# Patient Record
Sex: Male | Born: 1965 | Hispanic: No | Marital: Single | Smoking: Never smoker
Health system: Southern US, Community
[De-identification: ages and names within clinical notes are randomized; demographics above are authoritative.]

## PROBLEM LIST (undated history)

## (undated) DIAGNOSIS — E785 Hyperlipidemia, unspecified: Secondary | ICD-10-CM

## (undated) DIAGNOSIS — E119 Type 2 diabetes mellitus without complications: Secondary | ICD-10-CM

## (undated) DIAGNOSIS — E559 Vitamin D deficiency, unspecified: Secondary | ICD-10-CM

## (undated) HISTORY — DX: Vitamin D deficiency, unspecified: E55.9

## (undated) HISTORY — DX: Hyperlipidemia, unspecified: E78.5

## (undated) HISTORY — DX: Type 2 diabetes mellitus without complications: E11.9

---

## 2000-01-23 HISTORY — PX: LITHOTRIPSY: SUR834

## 2001-06-20 ENCOUNTER — Observation Stay (HOSPITAL_COMMUNITY): Admission: EM | Admit: 2001-06-20 | Discharge: 2001-06-21 | Payer: Self-pay | Admitting: Urology

## 2001-07-17 ENCOUNTER — Ambulatory Visit (HOSPITAL_BASED_OUTPATIENT_CLINIC_OR_DEPARTMENT_OTHER): Admission: RE | Admit: 2001-07-17 | Discharge: 2001-07-17 | Payer: Self-pay | Admitting: Urology

## 2001-07-17 ENCOUNTER — Encounter: Payer: Self-pay | Admitting: Urology

## 2001-07-31 ENCOUNTER — Encounter: Payer: Self-pay | Admitting: Urology

## 2001-07-31 ENCOUNTER — Encounter: Admission: RE | Admit: 2001-07-31 | Discharge: 2001-07-31 | Payer: Self-pay | Admitting: Urology

## 2012-12-29 ENCOUNTER — Ambulatory Visit (INDEPENDENT_AMBULATORY_CARE_PROVIDER_SITE_OTHER): Payer: 59 | Admitting: Internal Medicine

## 2012-12-29 ENCOUNTER — Encounter: Payer: Self-pay | Admitting: Internal Medicine

## 2012-12-29 VITALS — BP 128/72 | HR 64 | Temp 98.1°F | Resp 16 | Ht 64.0 in | Wt 162.4 lb

## 2012-12-29 DIAGNOSIS — Z1212 Encounter for screening for malignant neoplasm of rectum: Secondary | ICD-10-CM

## 2012-12-29 DIAGNOSIS — E559 Vitamin D deficiency, unspecified: Secondary | ICD-10-CM

## 2012-12-29 DIAGNOSIS — Z0000 Encounter for general adult medical examination without abnormal findings: Secondary | ICD-10-CM

## 2012-12-29 DIAGNOSIS — Z113 Encounter for screening for infections with a predominantly sexual mode of transmission: Secondary | ICD-10-CM

## 2012-12-29 DIAGNOSIS — R030 Elevated blood-pressure reading, without diagnosis of hypertension: Secondary | ICD-10-CM

## 2012-12-29 DIAGNOSIS — E782 Mixed hyperlipidemia: Secondary | ICD-10-CM

## 2012-12-29 DIAGNOSIS — E119 Type 2 diabetes mellitus without complications: Secondary | ICD-10-CM

## 2012-12-29 DIAGNOSIS — R7401 Elevation of levels of liver transaminase levels: Secondary | ICD-10-CM

## 2012-12-29 DIAGNOSIS — Z111 Encounter for screening for respiratory tuberculosis: Secondary | ICD-10-CM

## 2012-12-29 DIAGNOSIS — E1169 Type 2 diabetes mellitus with other specified complication: Secondary | ICD-10-CM | POA: Insufficient documentation

## 2012-12-29 LAB — PSA: PSA: 0.46 ng/mL (ref <=4.00)

## 2012-12-29 LAB — CBC WITH DIFFERENTIAL/PLATELET
Basophils Absolute: 0.1 10*3/uL (ref 0.0–0.1)
Basophils Relative: 1 % (ref 0–1)
Eosinophils Absolute: 0.1 10*3/uL (ref 0.0–0.7)
Eosinophils Relative: 1 % (ref 0–5)
HCT: 47.4 % (ref 39.0–52.0)
MCH: 30.2 pg (ref 26.0–34.0)
MCHC: 35.4 g/dL (ref 30.0–36.0)
MCV: 85.3 fL (ref 78.0–100.0)
Monocytes Absolute: 0.5 10*3/uL (ref 0.1–1.0)
Neutro Abs: 5.3 10*3/uL (ref 1.7–7.7)
Platelets: 234 10*3/uL (ref 150–400)
RDW: 14.1 % (ref 11.5–15.5)
WBC: 7.7 10*3/uL (ref 4.0–10.5)

## 2012-12-29 LAB — VITAMIN B12: Vitamin B-12: 581 pg/mL (ref 211–911)

## 2012-12-29 LAB — LIPID PANEL
Cholesterol: 210 mg/dL — ABNORMAL HIGH (ref 0–200)
HDL: 43 mg/dL (ref >39)
Total CHOL/HDL Ratio: 4.9 Ratio
Triglycerides: 220 mg/dL — ABNORMAL HIGH (ref <150)

## 2012-12-29 LAB — HEPATIC FUNCTION PANEL
Albumin: 4.6 g/dL (ref 3.5–5.2)
Total Protein: 7.3 g/dL (ref 6.0–8.3)

## 2012-12-29 LAB — HEPATITIS B SURFACE ANTIBODY,QUALITATIVE: Hep B S Ab: NEGATIVE

## 2012-12-29 LAB — MAGNESIUM: Magnesium: 2.1 mg/dL (ref 1.5–2.5)

## 2012-12-29 LAB — HEPATITIS A ANTIBODY, TOTAL: Hep A Total Ab: REACTIVE — AB

## 2012-12-29 LAB — BASIC METABOLIC PANEL WITH GFR
BUN: 11 mg/dL (ref 6–23)
Creat: 0.86 mg/dL (ref 0.50–1.35)
GFR, Est African American: >89 mL/min
GFR, Est Non African American: >89 mL/min

## 2012-12-29 LAB — HEPATITIS C ANTIBODY: HCV Ab: NEGATIVE

## 2012-12-29 LAB — HEMOGLOBIN A1C: Mean Plasma Glucose: 151 mg/dL — ABNORMAL HIGH (ref <117)

## 2012-12-29 LAB — TESTOSTERONE: Testosterone: 320 ng/dL (ref 300–890)

## 2012-12-29 LAB — HEPATITIS B CORE ANTIBODY, TOTAL: Hep B Core Total Ab: NONREACTIVE

## 2012-12-29 NOTE — Patient Instructions (Signed)
Continue diet & medications same as discussed.   Further disposition pending lab results.    Diabetes and Exercise Exercising regularly is important. It is not just about losing weight. It has many health benefits, such as:  Improving your overall fitness, flexibility, and endurance.  Increasing your bone density.  Helping with weight control.  Decreasing your body fat.  Increasing your muscle strength.  Reducing stress and tension.  Improving your overall health. People with diabetes who exercise gain additional benefits because exercise:  Reduces appetite.  Improves the body's use of blood sugar (glucose).  Helps lower or control blood glucose.  Decreases blood pressure.  Helps control blood lipids (such as cholesterol and triglycerides).  Improves the body's use of the hormone insulin by:  Increasing the body's insulin sensitivity.  Reducing the body's insulin needs.  Decreases the risk for heart disease because exercising:  Lowers cholesterol and triglycerides levels.  Increases the levels of good cholesterol (such as high-density lipoproteins [HDL]) in the body.  Lowers blood glucose levels. YOUR ACTIVITY PLAN  Choose an activity that you enjoy and set realistic goals. Your health care provider or diabetes educator can help you make an activity plan that works for you. You can break activities into 2 or 3 sessions throughout the day. Doing so is as good as one long session. Exercise ideas include:  Taking the dog for a walk.  Taking the stairs instead of the elevator.  Dancing to your favorite song.  Doing your favorite exercise with a friend. RECOMMENDATIONS FOR EXERCISING WITH TYPE 1 OR TYPE 2 DIABETES   Check your blood glucose before exercising. If blood glucose levels are greater than 240 mg/dL, check for urine ketones. Do not exercise if ketones are present.  Avoid injecting insulin into areas of the body that are going to be exercised. For  example, avoid injecting insulin into:  The arms when playing tennis.  The legs when jogging.  Keep a record of:  Food intake before and after you exercise.  Expected peak times of insulin action.  Blood glucose levels before and after you exercise.  The type and amount of exercise you have done.  Review your records with your health care provider. Your health care provider will help you to develop guidelines for adjusting food intake and insulin amounts before and after exercising.  If you take insulin or oral hypoglycemic agents, watch for signs and symptoms of hypoglycemia. They include:  Dizziness.  Shaking.  Sweating.  Chills.  Confusion.  Drink plenty of water while you exercise to prevent dehydration or heat stroke. Body water is lost during exercise and must be replaced.  Talk to your health care provider before starting an exercise program to make sure it is safe for you. Remember, almost any type of activity is better than none. Document Released: 03/31/2003 Document Revised: 09/10/2012 Document Reviewed: 06/17/2012 [facility] Patient Information 2014 [place], [place]. Cholesterol Cholesterol is a white, waxy, fat-like protein needed by your body in small amounts. The liver makes all the cholesterol you need. It is carried from the liver by the blood through the blood vessels. Deposits (plaque) may build up on blood vessel walls. This makes the arteries narrower and stiffer. Plaque increases the risk for heart attack and stroke. You cannot feel your cholesterol level even if it is very high. The only way to know is by a blood test to check your lipid (fats) levels. Once you know your cholesterol levels, you should keep a record of the  test results. Work with your caregiver to to keep your levels in the desired range. WHAT THE RESULTS MEAN:  Total cholesterol is a rough measure of all the cholesterol in your blood.  LDL is the so-called bad cholesterol. This is the  type that deposits cholesterol in the walls of the arteries. You want this level to be low.  HDL is the good cholesterol because it cleans the arteries and carries the LDL away. You want this level to be high.  Triglycerides are fat that the body can either burn for energy or store. High levels are closely linked to heart disease. DESIRED LEVELS:  Total cholesterol below 200.  LDL below 100 for people at risk, below 70 for very high risk.  HDL above 50 is good, above 60 is best.  Triglycerides below 150. HOW TO LOWER YOUR CHOLESTEROL:  Diet.  Choose fish or white meat chicken and [place], roasted or baked. Limit fatty cuts of red meat, fried foods, and processed meats, such as sausage and lunch meat.  Eat lots of fresh fruits and vegetables. Choose whole grains, beans, pasta, potatoes and cereals.  Use only small amounts of olive, corn or canola oils. Avoid butter, mayonnaise, shortening or palm kernel oils. Avoid foods with trans-fats.  Use skim/nonfat milk and low-fat/nonfat yogurt and cheeses. Avoid whole milk, cream, ice cream, egg yolks and cheeses. Healthy desserts include angel food cake, ginger snaps, animal crackers, hard candy, popsicles, and low-fat/nonfat frozen yogurt. Avoid pastries, cakes, pies and cookies.  Exercise.  A regular program helps decrease LDL and raises HDL.  Helps with weight control.  Do things that increase your activity level like gardening, walking, or taking the stairs.  Medication.  May be prescribed by your caregiver to help lowering cholesterol and the risk for heart disease.  You may need medicine even if your levels are normal if you have several risk factors. HOME CARE INSTRUCTIONS   Follow your diet and exercise programs as suggested by your caregiver.  Take medications as directed.  Have blood work done when your caregiver feels it is necessary. MAKE SURE YOU:   Understand these instructions.  Will watch your  condition.  Will get help right away if you are not doing well or get worse. Document Released: 10/03/2000 Document Revised: 04/02/2011 Document Reviewed: 03/26/2007 [facility] Patient Information 2014 [place], [place]. Vitamin D Deficiency Vitamin D is an important vitamin that your body needs. Having too little of it in your body is called a deficiency. A very bad deficiency can make your bones soft and can cause a condition called rickets.  Vitamin D is important to your body for different reasons, such as:   It helps your body absorb 2 minerals called calcium and phosphorus.  It helps make your bones healthy.  It may prevent some diseases, such as diabetes and multiple sclerosis.  It helps your muscles and heart. You can get vitamin D in several ways. It is a natural part of some foods. The vitamin is also added to some dairy products and cereals. Some people take vitamin D supplements. Also, your body makes vitamin D when you are in the sun. It changes the sun's rays into a form of the vitamin that your body can use. CAUSES   Not eating enough foods that contain vitamin D.  Not getting enough sunlight.  Having certain digestive system diseases that make it hard to absorb vitamin D. These diseases include Crohn's disease, chronic pancreatitis, and cystic fibrosis.  Having a  surgery in which part of the stomach or small intestine is removed.  Being obese. Fat cells pull vitamin D out of your blood. That means that obese people may not have enough vitamin D left in their blood and in other body tissues.  Having chronic kidney or liver disease. RISK FACTORS Risk factors are things that make you more likely to develop a vitamin D deficiency. They include:  Being older.  Not being able to get outside very much.  Living in a nursing home.  Having had broken bones.  Having weak or thin bones (osteoporosis).  Having a disease or condition that changes how your body absorbs vitamin  D.  Having dark skin.  Some medicines such as seizure medicines or steroids.  Being overweight or obese. SYMPTOMS Mild cases of vitamin D deficiency may not have any symptoms. If you have a very bad case, symptoms may include:  Bone pain.  Muscle pain.  Falling often.  Broken bones caused by a minor injury, due to osteoporosis. DIAGNOSIS A blood test is the best way to tell if you have a vitamin D deficiency. TREATMENT Vitamin D deficiency can be treated in different ways. Treatment for vitamin D deficiency depends on what is causing it. Options include:  Taking vitamin D supplements.  Taking a calcium supplement. Your caregiver will suggest what dose is best for you. HOME CARE INSTRUCTIONS  Take any supplements that your caregiver prescribes. Follow the directions carefully. Take only the suggested amount.  Have your blood tested 2 months after you start taking supplements.  Eat foods that contain vitamin D. Healthy choices include:  Fortified dairy products, cereals, or juices. Fortified means vitamin D has been added to the food. Check the label on the package to be sure.  Fatty fish like salmon or trout.  Eggs.  Oysters.  Do not use a tanning bed.  Keep your weight at a healthy level. Lose weight if you need to.  Keep all follow-up appointments. Your caregiver will need to perform blood tests to make sure your vitamin D deficiency is going away. SEEK MEDICAL CARE IF:  You have any questions about your treatment.  You continue to have symptoms of vitamin D deficiency.  You have nausea or vomiting.  You are constipated.  You feel confused.  You have severe abdominal or back pain. MAKE SURE YOU:  Understand these instructions.  Will watch your condition.  Will get help right away if you are not doing well or get worse. Document Released: 04/02/2011 Document Revised: 05/05/2012 Document Reviewed: 04/02/2011 [facility] Patient Information 2014  [place], [place].

## 2012-12-29 NOTE — Progress Notes (Signed)
Patient ID: Victor Mcintyre, male   DOB: 03-27-1965, 47 y.o.   MRN: [id number]  Annual Screening Comprehensive Examination  This very nice    presents for complete physical.  Patient has been followed for hx/o elevated BP, Prediabetes, Hyperlipidemia, and Vitamin D Deficiency.   Patient's BP has been controlled at office visits over the last year he has been coming to this office. Patient denies any cardiac symptoms as chest pain, palpitations, shortness of breath, dizziness or ankle swelling.   Patient's hyperlipidemia is not well controlled with diet and he is aware he may need to start medication. Last cholesterol last visit was 235, triglycerides  395,HDL 42 and LDL 114.     Patient has prediabetes with last A1c 6.7% in September and it had been 6.5% in January. Patient denies reactive hypoglycemic symptoms, visual blurring, diabetic polys, or paresthesias.    Finally, patient has history of Vitamin D Deficiency with last vitamin D 28 in November 2013.       Allergies  Allergen Reactions  . Poison Ivy Extract [Extract Of Poison Ivy] Rash    Past Medical History  Diagnosis Date  . Diabetes mellitus without complication   . Hyperlipidemia   . Vitamin D deficiency     Past Surgical History  Procedure Laterality Date  . Lithotripsy  2002    Family History  Problem Relation Age of Onset  . Cancer Mother   . Diabetes Father     History   Social History  . Marital Status: Married x 1 &1/2 years    Spouse Name: N/A    Number of Children: N/A  . Years of Education: N/A   Occupational History  . 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   Social History Main Topics  . Smoking status: Never Smoker   . Smokeless tobacco: Not on file  . Alcohol Use: occas beer  . Drug Use: No  . Sexual Activity: Not on file   Other Topics Concern  . Not on file   Social History  Narrative  . No narrative on file    ROS Constitutional: Denies fever, chills, weight loss/gain, headaches, insomnia, fatigue, night sweats, and change in appetite. Eyes: Denies redness, blurred vision, diplopia, discharge, itchy, watery eyes.  ENT: Denies discharge, congestion, post nasal drip, epistaxis, sore throat, earache, hearing loss, dental pain, Tinnitus, Vertigo, Sinus pain, snoring.  Cardio: Denies chest pain, palpitations, irregular heartbeat, syncope, dyspnea, diaphoresis, orthopnea, PND, claudication, edema Respiratory: denies cough, dyspnea, DOE, pleurisy, hoarseness, laryngitis, wheezing.  Gastrointestinal: Denies dysphagia, heartburn, reflux, water brash, pain, cramps, nausea, vomiting, bloating, diarrhea, constipation, hematemesis, melena, hematochezia, jaundice, hemorrhoids Genitourinary: Denies dysuria, frequency, urgency, nocturia, hesitancy, discharge, hematuria, flank pain Musculoskeletal: Denies arthralgia, myalgia, stiffness, Jt. Swelling, pain, limp, and strain/sprain. Skin: Denies puritis, rash, hives, warts, acne, eczema, changing in skin lesion Neuro: No weakness, tremor, incoordination, spasms, paresthesia, pain Psychiatric: Denies confusion, memory loss, sensory loss Endocrine: Denies change in weight, skin, hair change, nocturia, and paresthesia, diabetic polys, visual blurring, hyper / hypo glycemic episodes.  Heme/Lymph: No excessive bleeding, bruising, or elarged lymph nodes.  Filed Vitals:   12/29/12 1113  BP: 128/72  Pulse: 64  Temp: 98.1 F (36.7 C)  Resp: 16    Estimated body mass index is 27.86 kg/(m^2) as calculated from the following:   Height as of this encounter: 5\' 4"  (1.626 m).   Weight as of  this encounter: 162 lb 6.4 oz (73.664 kg).  Physical Exam General Appearance: Well nourished, in no apparent distress. Eyes: PERRLA, EOMs, conjunctiva no swelling or erythema, normal fundi and vessels. Sinuses: No frontal/maxillary  tenderness ENT/Mouth: EACs patent / TMs  nl. Nares clear without erythema, swelling, mucoid exudates. Oral hygiene is good. No erythema, swelling, or exudate. Tongue normal, non-obstructing. Tonsils not swollen or erythematous. Hearing normal.  Neck: Supple, thyroid normal. No bruits, nodes or JVD. Respiratory: Respiratory effort normal.  BS equal and clear bilateral without rales, rhonci, wheezing or stridor. Cardio: Heart sounds are normal with regular rate and rhythm and no murmurs, rubs or gallops. Peripheral pulses are normal and equal bilaterally without edema. No aortic or femoral bruits. Chest: symmetric with normal excursions and percussion.  Abdomen: Flat, soft, with bowl sounds. Nontender, no guarding, rebound, hernias, masses, or organomegaly.  Lymphatics: Non tender without lymphadenopathy.  Genitourinary: No hernias.Testes nl. DRE - prostate nl for age - smooth & firm w/o nodules. Musculoskeletal: Full ROM all peripheral extremities, joint stability, 5/5 strength, and normal gait. Skin: Warm and dry without rashes, lesions, cyanosis, clubbing or  ecchymosis.  Neuro: Cranial nerves intact, reflexes equal bilaterally. Normal muscle tone, no cerebellar symptoms. Sensation intact.  Pysch: Awake and oriented X 3, normal affect, insight and judgment appropriate.   Assessment and Plan  1. Annual Screening Examination 2. Hx/o elevated BP  3. Hyperlipidemia 4. Pre Diabetes 5. Vitamin D Deficiency  Continue prudent diet as discussed, weight control, BP monitoring, regular exercise, and medications as discussed.  Discussed possible med.s for cholesterol and / or diabetes. Routine screening labs and tests as requested with regular follow-up as recommended.

## 2012-12-30 LAB — MICROALBUMIN / CREATININE URINE RATIO
Creatinine, Urine: 107.9 mg/dL
Microalb Creat Ratio: 5.4 mg/g (ref 0.0–30.0)
Microalb, Ur: 0.58 mg/dL (ref 0.00–1.89)

## 2012-12-30 LAB — URINALYSIS, MICROSCOPIC ONLY
Casts: NONE SEEN
Crystals: NONE SEEN
Squamous Epithelial / LPF: NONE SEEN

## 2012-12-30 LAB — RPR

## 2012-12-30 LAB — INSULIN, FASTING: Insulin fasting, serum: 11 u[IU]/mL (ref 3–28)

## 2012-12-30 LAB — VITAMIN D 25 HYDROXY (VIT D DEFICIENCY, FRACTURES): Vit D, 25-Hydroxy: 29 ng/mL — ABNORMAL LOW (ref 30–89)

## 2012-12-31 LAB — TB SKIN TEST
Induration: 0 mm
TB Skin Test: NEGATIVE

## 2013-01-01 LAB — HEPATITIS B E ANTIBODY: Hepatitis Be Antibody: NEGATIVE

## 2013-04-07 ENCOUNTER — Ambulatory Visit (INDEPENDENT_AMBULATORY_CARE_PROVIDER_SITE_OTHER): Payer: 59 | Admitting: Emergency Medicine

## 2013-04-07 ENCOUNTER — Encounter: Payer: Self-pay | Admitting: Emergency Medicine

## 2013-04-07 VITALS — BP 118/64 | HR 66 | Temp 98.2°F | Resp 16 | Ht 64.0 in | Wt 150.0 lb

## 2013-04-07 DIAGNOSIS — R7309 Other abnormal glucose: Secondary | ICD-10-CM

## 2013-04-07 DIAGNOSIS — E782 Mixed hyperlipidemia: Secondary | ICD-10-CM

## 2013-04-07 DIAGNOSIS — Z79899 Other long term (current) drug therapy: Secondary | ICD-10-CM

## 2013-04-07 DIAGNOSIS — E559 Vitamin D deficiency, unspecified: Secondary | ICD-10-CM

## 2013-04-07 LAB — CBC WITH DIFFERENTIAL/PLATELET
BASOS ABS: 0.1 10*3/uL (ref 0.0–0.1)
BASOS PCT: 1 % (ref 0–1)
EOS ABS: 0.2 10*3/uL (ref 0.0–0.7)
Eosinophils Relative: 2 % (ref 0–5)
HCT: 46.3 % (ref 39.0–52.0)
Hemoglobin: 16.1 g/dL (ref 13.0–17.0)
Lymphocytes Relative: 26 % (ref 12–46)
Lymphs Abs: 2.2 10*3/uL (ref 0.7–4.0)
MCH: 30.3 pg (ref 26.0–34.0)
MCHC: 34.8 g/dL (ref 30.0–36.0)
MCV: 87.2 fL (ref 78.0–100.0)
MONOS PCT: 7 % (ref 3–12)
Monocytes Absolute: 0.6 10*3/uL (ref 0.1–1.0)
NEUTROS PCT: 64 % (ref 43–77)
Neutro Abs: 5.3 10*3/uL (ref 1.7–7.7)
PLATELETS: 238 10*3/uL (ref 150–400)
RBC: 5.31 MIL/uL (ref 4.22–5.81)
RDW: 13.7 % (ref 11.5–15.5)
WBC: 8.3 10*3/uL (ref 4.0–10.5)

## 2013-04-07 NOTE — Patient Instructions (Signed)
Dieta para el control del colesterol y las grasas  (Fat and Cholesterol Control Diet) Su dieta tiene un efecto sobre los niveles de grasa y colesterol en su sangre y rganos. El exceso de grasa y colesterol en la sangre puede afectar su:   Corazn.  Vasos sanguneos (arterias, venas).  Vescula biliar.  Hgado.  Pncreas. CONTROL DE LA GRASA Y EL COLESTEROL CON LA DIETA  Ciertos alimentos pueden subir el colesterol y otros pueden bajarlo. Es importante que reemplace las grasas malas por otros tipos de grasas.  No consuma:   Carnes grasas, como las salchichas y el salame.  Margarina en barra y algunas margarinas en tubo que tienen "aceites parcialmente hidrogenados" en ellos.  Productos horneados, como galletas dulces y saladas que tienen "aceites parcialmente hidrogenados" en ellos.  Los aceites tropicales, como los aceites de coco y de palma.  Consuma los siguientes alimentos:  Cortes de peceto o lomo de carne roja.  Pollo (sin piel).  Pescado.  Ternera.  Carne de pavo picada.  Mariscos.  Frutas, como manzanas.  Verduras, como el brcoli, papas y zanahorias.  Frijoles, arvejas y lentejas (legumbres).  Cereales, como cebada, arroz, cuscs y trigo bulgur.  Pastas (sin salsas de crema). Busque alimentos sin grasas, descremados y bajos en colesterol. Encontrar alimentos con fibra soluble y esteroles vegetales (fitosteroles). Usted debe comer 2 gramos al da de estos alimentos.  IDENTIFIQUE LOS ALIMENTOS BAJOS EN GRASAS Y COLESTEROL   Encuentre los alimentos con fibra soluble y esteroles vegetales (fitosteroles). Debe consumir 2 gramos al da de estos alimentos. Ellos son:  Frutas.  Vegetales.  Granos enteros.  Frijoles y guisantes secos.  Frutos secos y semillas.  Lea las etiquetas del paquete. Busque los alimentos bajos en grasas saturadas, libres en grasas trans, y de bajo contenido graso.  Elija quesos que contengan slo 2 a 3 gramos de grasa saturada  por onza.  Use margarina que sea saludable para el corazn que sea libre de grasas trans o aceites parcialmente hidrogenados.  Evite comprar productos horneados que contengan aceites parcialmente hidrogenados. En su lugar, compre productos horneados elaborados con cereales integrales (trigo integral o harina de avena integral). Evite los productos horneados en cuya etiqueta diga con "harina" o "harina enriquecida".  Compre sopas en lata que no sean cremosas, con bajo contenido de sal y sin grasas adicionadas. PREPARE SUS ALIMENTOS USTED MISMO   Cocine los alimentos hervidos, horneados, al vapor o asados. No fra los alimentos.  Utilice un spray antiadherente para cocinar.  Use limn o hierbas para condimentar comidas en lugar de usar mantequilla o margarina.  Use yogur descremados, salsas o aderezos para ensaladas con bajo contenido de grasas. BAJO EN GRASAS SATURADAS / SUSTITUTOS BAJOS EN GRASA  Carnes / grasas saturadas (g)  Evite: Bife, veteado (3 oz/85 g) / 11 g  Elija: Bife, magro(3 oz/85 g) / 4 g  Evite: Hamburguesa (3 oz/85 g) / 7 g  Elija: Hamburguesa, magra (3 oz/85 g) / 5 g  Evite: Jamn (3 oz/85 g) / 6 g  Elija: Jamn, corte magro (3 oz/85 g) / 2,4 g  Evite: Pollo con piel, carne oscura (3 oz/85 g) / 4 g  Elija: Pollo, sin piel, carne oscura (3 oz/85 g) / 2 g  Evite: Pollo con piel, carne blanca (3 oz/85 g) / 2,5 g  Elija: Pollo, sin piel, carne blanca (3 oz/85 g) / 1 g Lcteos / Grasa saturada (g)  Evite: Leche entera (1 taza) / 5 g  Elija: Leche   descremada, 2% (1 taza) / 3 g  Elija: Leche descremada, 1% (1 taza) / 1,5 g  Elija: Leche descremada, 1 taza / 0,3 g  Evite: Queso duro (1 oz/28 g) / 6 g  Elija: Queso de [employer] (1 oz/28 g) / 2 a 3 g  Evite: Queso cottage, 4% de grasa (1 taza) / 6,5 g  Elija: Queso cottage bajo en grasa, 1% de grasa (1 taza) / 1,5 g  Evite: Helado (1 taza) / 9 g  Elija: Sorbete (1 taza) / 2,5 g  Elija: Yogur  congelado descremado (1 taza) / 0,3 g  Elija: Barra de frutas congeladas / trace  Evite: Crema batida (1 cucharada) / 3,5 g  Elija: Crema batida no lctea (1 cucharada) / 1 g Condimentos / Grasas Saturadas (g)  Evite: Mayonesa (1 cucharada) / 2 g  Elija: Mayonesa baja en grasa (1 cucharada) / 1 g  Evite: Mantequilla (1 cucharada) / 7 g  Elija: Margarina light extra (1 cucharada) / 1 g  Evite: Aceite de coco (1 cucharada) / 11,8 g  Elija: Aceite de oliva (1 cucharada) / 1,8 g  Elija: Aceite de maz (1 cucharada) / 1,7 g  Elija: Aceite de crtamo (1 cucharada) / 1,2 g  Elija: Aceite de girasol (1 cucharada) / 1,4 g  Elija: Aceite de soja (1 cucharada) / 2,4 g  Elija: Aceite de canola (1 cucharada) / 1 g Document Released: 07/10/2011 Document Revised: 05/05/2012 ExitCare Patient Information 2014 [place], [place]. Gua de planeamiento de la alimentacin para diabticos (Diabetes Meal Planning Guide) La gua de planeamiento de alimentacin para diabticos es una herramienta para ayudarlo a planear sus comidas y colaciones. Es importante para las personas con diabetes controlar sus niveles de International aid/development worker. Elegir los [employer] correctos y las cantidades adecuadas durante el da le ayudar a Technical brewer. [place] bien puede incluso ayudarlo a mejorar la presin sangunea y Barista o Pharmacologist un peso saludable. CUENTE LOS HIDRATOS DE CARBONO CON FACILIDAD Cuando consume hidratos de carbono, stos se transforman en azcar (glucosa). Esto a su vez Counsellor de Production assistant, radio. El conteo de carbohidratos puede ayudarlo a Chief Operating Officer este nivel para que se sienta mejor. Al planear sus alimentos con el conteo de carbohidratos, podr tener ms flexibilidad en lo que come y Physiological scientist con el consumo de alimentos. El conteo de carbohidratos significa simplemente sumar la cantidad total de gramos de carbohidratos a sus comidas o colaciones. Trate de consumir la misma  cantidad en cada comida. A continuacin encontrar una lista de 1 porcin o 15 gr. de carbohidratos. A continuacin se enumeran. Pregunte al mdico cuntos gramos de carbohidratos necesita comer en cada comida o colacin. Almidones y granos  1 [place] de pan.   bollo ingls o bollo para hamburguesa o hotdog.   taza de cereal fro (sin azcar).   taza de pasta o arroz cocido.   taza de vegetales que contengan almidn (maz, papas, arvejas, porotos, calabaza).  1 omelette (6 pulgadas).   bollo.  1 waffle o panqueque (del tamao de un CD).   taza de cereales cocidos.  4 a 6 galletas saldas pequeas. *Se recomienda el consumo de granos enteros. Frutas  1 taza de frutos rojos, meln, papaya o anan sin azcar.  1 fruta fresca pequea.   banana o mango.   taza de jugo de frutas (4 onzas sin endulzar).   taza de fruta envasada en jugo natural o agua.  2 cucharadas de frutas  secas.  12-15 uvas o cerezas. Leche y yogurt  1 taza de PPG Industriesleche descremada o al 1%.  1 taza de leche de soja.  6 onzas de yogurt descremado con edulcorante sin azcar.  6 onzas de yogur descremado de soja.  6 onzas de yogur natural. Vegetales  1 taza de vegetales crudos o  de vegetales cocidos se considera cero carbohidratos o una comida "libre".  Si come 3 o ms porciones en una comida, cuntelas como 1 porcin de carbohidratos. Otros carbohidratos   onzas de chips o pretzels.   taza de helado de crema o yogur helado.   taza de helado de agua.  5 cm de torta no congelada.  1 cucharada de miel, azcar, mermelada, jalea o almbar.  2 galletitas dulces pequeas.  3 cuadrados de crackers de graham.  3 tazas de palomitas de maz.  6 crackers.  1 taza de caldo.  Cuente 1 taza de guisado u otra mezcla de alimentos como 2 porciones de carbohidratos.  Los alimentos con menos de 20 caloras por porcin deben contarse como cero carbohidratos o alimento "libre". Si lo desea  compre un libro o software de computacin que enumere la cantidad de gramos de carbohidratos de los diferentes alimentos. Adems, el panel nutricional en las etiquetas de los productos que consume es una buena fuente de informacin. Le indicar el tamao de la porcin y la cantidad total de carbohidratos que consumir por cada una. Divida este nmero por 15 para obtener el nmero de conteo de carbohidratos por porcin. Recuerde: cada porcin son 5815 gramos de carbohidratos. PORCIONES La medicin de los alimentos y el tamao de las porciones lo ayudarn a Scientist, physiologicalcontrolar la cantidad exacta de comida que debe ingerir. La lista que sigue le mostrar el tamao de algunas porciones comunes.   1 onza.................4 dados apilados.  3 onzas..............Marland Kitchen.Un mazo de cartas.  1 cucharadita...Marland Kitchen.Marland Kitchen.La punta de un dedo pequeo.  1 cucharada.......Marland Kitchen.Un dedo.  2 cucharadas....Marland Kitchen.Marland Kitchen.Una pelota de golf.   taza..............Marland Kitchen.La mitad de un puo.  1 taza...............Marland Kitchen.Un puo. EJEMPLO DE PLAN DE ALIMENTACIN PARA DIABTICOS: A continuacin se muestra un ejemplo de plan de alimentacin que incluye comidas de los grupos de granos y Stonebridgefculas, Sports administratorvegetales, frutas y carnes. Un nutricionista podr confeccionarle un plan individualizado para cubrir sus necesidades calricas y decirle el nmero de porciones que necesita de [place] Millscada grupo. Sin embargo, podra Pulte Homesintercambiar los alimentos que contengan carbohidratos (lcteos, cereales y frutas). Controlar la cantidad total de carbohidratos en los alimentos o colaciones es ms importante que asegurarse de incluir todos los grupos alimenticios cada vez que come.  El siguiente plan de alimentacin es un ejemplo de una dieta de 2000 caloras mediante el conteo de carbohidratos. Este plan contiene 17 porciones de carbohidratos. Desayuno  1 taza de avena (2 porciones de carbohidratos).   taza de yogur light(1 porcin de carbohidratos).  1 taza de arndanos (1 porcin de carbohidratos).    taza de almendras. Colacin  1 manzana grande (2 porciones de carbohidratos).  1 palito de queso bajo [employer]en grasa. Almuerzo  Ensalada de pechuga de pollo.  1 taza de espinacas.   taza de tomates cortados.  2 oz (60 gr) de pechuga de pollo en rebanadas.  2 cucharadas de aderezo italiano bajo en Avnetcontenido graso.  12 galletas integrales (2 porciones de carbohidratos).  12 a 15 uvas (1 porcin de carbohidratos).  1 taza de PPG Industriesleche descremada (1porcin de carbohidratos). Colacin  1 taza de zanahorias.   taza de pur de garbanzos (1 porcin de  carbohidratos). Cena  3 oz (80 gr) de salmn a la parrilla.  1 taza de arroz integral (3 porciones de carbohidratos). Colacin  1  taza de brcoli al vapor (1 porcin de carbohidrato) con una cucharadita de aceite de oliva y jugo de limn.  1 taza de budn light (2 porciones de carbohidratos). HOJA DE PLANEAMIENTO DE LA ALIMENTACIN: El dietista podr utilizar esta hoja para ayudarlo a decidir cuntas porciones y qu tipos de alimentos son los adecuados para usted.  DESAYUNO Grupo de alimentos y porciones / Alimento elegido Granos/Fculas_________________________________________________ Lcteos________________________________________________________ Rufina Falco ______________________________________________________ Lou Miner __________________________________________________________ Charlesetta Ivory _________________________________________________________ Rosalin Hawking _________________________________________________________ Lorin Mercy de alimentos y porciones / Alimento elegido Granos/Fculas___________________________________________________ Lcteos_________________________________________________________ Lou Miner ___________________________________________________________ Charlesetta Ivory __________________________________________________________ Rosalin Hawking __________________________________________________________ Danford Bad de alimentos y porciones /  Alimento elegido Granos/Fculas___________________________________________________ Lcteos_________________________________________________________ Lou Miner ___________________________________________________________ Charlesetta Ivory __________________________________________________________ Rosalin Hawking __________________________________________________________ Jettie Pagan de alimentos y porciones / Alimento elegido Granos/Fculas_________________________________________________ Lcteos________________________________________________________ Rufina Falco ______________________________________________________ Lou Miner _________________________________________________________ Charlesetta Ivory ________________________________________________________ Rosalin Hawking ________________________________________________________ Carolyn Stare DIARIOS Fculas_______________________________________________________ Vegetales _____________________________________________________ Lou Miner ________________________________________________________ Lcteos_______________________________________________________ Carnes________________________________________________________ Rosalin Hawking ________________________________________________________ Document Released: 04/17/2007 Document Revised: 04/02/2011 ExitCare Patient Information 2014 [place].

## 2013-04-07 NOTE — Progress Notes (Signed)
Subjective:    Patient ID: Victor Mcintyre, male    DOB: 01/29/1965, 48 y.o.   MRN: [id number]  HPI Comments: 48 yo male with new diagnosis DM at last OV. He has never been on cholesterol or Diabetic medicine. He was newly diagnosed Vitamin D def and added vit d at last office visit. He is trying to improve diet to stay off of RX.  LAST LAB CHOL         210   12/29/2012 HDL           43   12/29/2012 LDLCALC      123   12/29/2012 TRIG         220   12/29/2012 CHOLHDL      4.9   12/29/2012 ALT           29   12/29/2012 AST           19   12/29/2012 ALKPHOS       81   12/29/2012 BILITOT      0.5   12/29/2012 CREATININE     0.86   12/29/2012 BUN              11   12/29/2012 NA              137   12/29/2012 K               4.3   12/29/2012 CL              101   12/29/2012 CO2              28   12/29/2012 HGBA1C      6.9   12/29/2012  He has been working around Engineer, agriculturalchemical fumes but denies any exposure directly to eyes. He notes they have been red and irritated but have improved since initial exposure. He denies pain and visual change. He notes some allergy drainage.      Medication List       This list is accurate as of: 04/07/13  3:09 PM.  Always use your most recent med list.               Vitamin D-3 5000 UNITS Tabs  Take 5,000 Units by mouth daily.        Review of Systems  HENT: Positive for postnasal drip.   Eyes: Positive for redness.  All other systems reviewed and are negative.   BP 118/64  Pulse 66  Temp(Src) 98.2 F (36.8 C) (Temporal)  Resp 16  Ht 5\' 4"  (1.626 m)  Wt 150 lb (68.04 kg)  BMI 25.73 kg/m2     Objective:   Physical Exam  Nursing note and vitals reviewed. Constitutional: He is oriented to person, place, and time. He appears well-developed and well-nourished.  HENT:  Head: Normocephalic and atraumatic.  Right Ear: External ear normal.  Left Ear: External ear normal.  Nose: Nose normal.  Mouth/Throat: No oropharyngeal exudate.  Eyes: EOM are normal.  Pupils are equal, round, and reactive to light. Right eye exhibits no discharge. Left eye exhibits no discharge.  Injected bilaterally, no obvious FB or trauma  Neck: Normal range of motion. Neck supple. No JVD present. No thyromegaly present.  Cardiovascular: Normal rate, regular rhythm, normal heart sounds and intact distal pulses.   Pulmonary/Chest: Effort normal and breath sounds normal.  Abdominal: Soft. Bowel sounds are normal. He exhibits no distension and no mass. There is no tenderness. There is  no rebound and no guarding.  Musculoskeletal: Normal range of motion. He exhibits no edema and no tenderness.  Lymphadenopathy:    He has no cervical adenopathy.  Neurological: He is alert and oriented to person, place, and time. He has normal reflexes. No cranial nerve deficit. Coordination normal.  Skin: Skin is warm and dry.  Psychiatric: He has a normal mood and affect. His behavior is normal. Judgment and thought content normal.          Assessment & Plan:  1.  3 month F/U for Cholesterol, New DX DM, D. Deficient. Needs healthy diet, cardio QD and obtain healthy weight. Check Labs, Check BP if >130/80 call office, Check BS if >200 call office  2. Allergic rhinitis- Allegra OTC, increase H2o, allergy hygiene explained. Try to limit exposure to chemical fumes with eyes/ wear protective gear, try to use eye drops for wetting/ irritation OTC if no relief needs EYE Doctor evaluation

## 2013-04-08 ENCOUNTER — Other Ambulatory Visit: Payer: Self-pay | Admitting: Emergency Medicine

## 2013-04-08 ENCOUNTER — Other Ambulatory Visit: Payer: Self-pay | Admitting: *Deleted

## 2013-04-08 LAB — BASIC METABOLIC PANEL WITH GFR
BUN: 13 mg/dL (ref 6–23)
CO2: 28 mEq/L (ref 19–32)
Calcium: 9.8 mg/dL (ref 8.4–10.5)
Chloride: 102 mEq/L (ref 96–112)
Creat: 0.81 mg/dL (ref 0.50–1.35)
GFR, Est Non African American: >89 mL/min
Glucose, Bld: 84 mg/dL (ref 70–99)
Potassium: 4.4 mEq/L (ref 3.5–5.3)
SODIUM: 137 meq/L (ref 135–145)

## 2013-04-08 LAB — HEPATIC FUNCTION PANEL
ALT: 32 U/L (ref 0–53)
AST: 24 U/L (ref 0–37)
Albumin: 4.6 g/dL (ref 3.5–5.2)
Alkaline Phosphatase: 82 U/L (ref 39–117)
BILIRUBIN DIRECT: 0.1 mg/dL (ref 0.0–0.3)
BILIRUBIN INDIRECT: 0.3 mg/dL (ref 0.2–1.2)
Total Bilirubin: 0.4 mg/dL (ref 0.2–1.2)
Total Protein: 7.1 g/dL (ref 6.0–8.3)

## 2013-04-08 LAB — LIPID PANEL
CHOL/HDL RATIO: 5.9 ratio
Cholesterol: 224 mg/dL — ABNORMAL HIGH (ref 0–200)
HDL: 38 mg/dL — ABNORMAL LOW (ref >39)
Triglycerides: 475 mg/dL — ABNORMAL HIGH (ref <150)

## 2013-04-08 LAB — VITAMIN D 25 HYDROXY (VIT D DEFICIENCY, FRACTURES): Vit D, 25-Hydroxy: 60 ng/mL (ref 30–89)

## 2013-04-08 LAB — MAGNESIUM: Magnesium: 2.1 mg/dL (ref 1.5–2.5)

## 2013-04-08 LAB — INSULIN, FASTING: Insulin fasting, serum: 5 u[IU]/mL (ref 3–28)

## 2013-04-08 MED ORDER — LOVASTATIN 20 MG PO TABS: 20.0000 mg | ORAL_TABLET | Freq: Every day | ORAL | Status: DC

## 2013-05-19 ENCOUNTER — Encounter: Payer: Self-pay | Admitting: Emergency Medicine

## 2013-05-19 ENCOUNTER — Ambulatory Visit (INDEPENDENT_AMBULATORY_CARE_PROVIDER_SITE_OTHER): Payer: 59 | Admitting: Emergency Medicine

## 2013-05-19 VITALS — BP 116/72 | HR 80 | Temp 97.8°F | Resp 18 | Ht 64.0 in | Wt 154.0 lb

## 2013-05-19 DIAGNOSIS — L309 Dermatitis, unspecified: Secondary | ICD-10-CM

## 2013-05-19 DIAGNOSIS — E782 Mixed hyperlipidemia: Secondary | ICD-10-CM

## 2013-05-19 DIAGNOSIS — L259 Unspecified contact dermatitis, unspecified cause: Secondary | ICD-10-CM

## 2013-05-19 DIAGNOSIS — M25579 Pain in unspecified ankle and joints of unspecified foot: Secondary | ICD-10-CM

## 2013-05-19 MED ORDER — FLUCONAZOLE 150 MG PO TABS: 150.0000 mg | ORAL_TABLET | ORAL | Status: DC

## 2013-05-19 MED ORDER — TRIAMCINOLONE ACETONIDE 0.5 % EX OINT: 1.0000 "application " | TOPICAL_OINTMENT | Freq: Two times a day (BID) | CUTANEOUS | Status: DC

## 2013-05-19 NOTE — Progress Notes (Signed)
   Subjective:    Patient ID: Victor Mcintyre, male    DOB: 05/21/1965, 48 y.o.   MRN: [id number]  HPI Comments: 48 yo male for Cholesterol recheck with labs with RX change. He is trying to eat healthier and exercise AD.  He twisted Left ankle 2 months ago and [facility] He notes ankle still painful especially with certain movements and wants referral to Ortho for evaluation. He denies pain with walking/ weight bearing in normal ROM.   Poison IVY x week on both arms. He has washed his linens but feels it is spreading. He has tried OTC lotions w/o relief.  Left foot raised rash has returned. He notes resolved in past with Diflucan and topical steroid. He denies any animal exposure for ring worm.     Medication List       This list is accurate as of: 05/19/13  4:30 PM.  Always use your most recent med list.               lovastatin 20 MG tablet  Commonly known as:  MEVACOR  Take 1 tablet (20 mg total) by mouth at bedtime.     Vitamin D-3 5000 UNITS Tabs  Take 5,000 Units by mouth daily.          Review of Systems  Musculoskeletal: Positive for arthralgias.  Skin: Positive for color change and rash.  All other systems reviewed and are negative.  BP 116/72  Pulse 80  Temp(Src) 97.8 F (36.6 C) (Temporal)  Resp 18  Ht 5\' 4"  (1.626 m)  Wt 154 lb (69.854 kg)  BMI 26.42 kg/m2     Objective:   Physical Exam  Nursing note and vitals reviewed. Constitutional: He is oriented to person, place, and time. He appears well-developed and well-nourished.  HENT:  Head: Normocephalic and atraumatic.  Right Ear: External ear normal.  Left Ear: External ear normal.  Nose: Nose normal.  Eyes: Conjunctivae and EOM are normal.  Neck: Normal range of motion. Neck supple. No JVD present. No thyromegaly present.  Cardiovascular: Normal rate, regular rhythm, normal heart sounds and intact distal pulses.   Pulmonary/Chest: Effort normal and breath sounds normal.   Abdominal: Soft. Bowel sounds are normal. He exhibits no distension. There is no tenderness.  Musculoskeletal: Normal range of motion. He exhibits tenderness. He exhibits no edema.  Mild with lateral ROM of left ankle  Lymphadenopathy:    He has no cervical adenopathy.  Neurological: He is alert and oriented to person, place, and time. He has normal reflexes. No cranial nerve deficit. Coordination normal.  Skin: Skin is warm and dry. Rash noted. There is erythema.  Bilateral arms with sporadic erythematous, elevated pustules 2-4 mm. Left foot with nickel size erythematous raised patch anterior.  Psychiatric: He has a normal mood and affect. His behavior is normal. Judgment and thought content normal.          Assessment & Plan:  1. Cholesterol- recheck labs, Need to eat healthier and exercise AD.   2. Rhus Dermatitis- Declines Prednisone oral/ shot. TOpical steroid AD. Hygiene advised  3. ? Ring worm- Topical steroid and Diflucan AD  4. ? Ankle strain- Ref ORtho, [facility] call with any concerns

## 2013-05-19 NOTE — Patient Instructions (Signed)
Hiedra venenosa (Poison Fisher Scientificvy) La hiedra venenosa es una erupcin causada por tocar las hojas de la planta hiedra venenosa. Generalmente la erupcin aparece 48 horas despus. Puede ser que slo tenga bultos, enrojecimiento y picazn. En algunos casos aparecen ampollas que se rompen Podr DIRECTVtener los ojos hinchados (irritados). La hiedra venenosa generalmente se cura en 2 a 3 semanas sin tratamiento. CUIDADOS EN EL HOGAR  Si ha tocado una hiedra venenosa:  Lave la piel con agua y jabn inmediatamente. Lave debajo de las uas. No se frote la piel.  Lave todas las prendas que haya usado.  Evite la hiedra venenosa en el futuro. La hiedra venenosa tiene 3 hojas en un tallo.  Use medicamentos para Consulting civil engineeraliviar la picazn que le haya indicado el mdico. No conduzca automviles mientras toma este medicamento.  Mantenga las llagas abiertas secas y limpias y cubiertas con un vendaje y con crema medicinal, si es necesario.  Consulte con el mdico los medicamentos que podr administrarle a los nios. SOLICITE AYUDA DE INMEDIATO SI:  Tiene llagas abiertas.  El enrojecimiento se extiende ms all de la zona de la erupcin.  Un lquido blanco amarillento (pus) aparece en el lugar de la erupcin.  El dolor [place]peora.  La temperatura oral le sube a ms de 38,9 C (102 F), y no puede bajarla con medicamentos. ASEGRESE DE QUE:  Comprende estas instrucciones.  Controlar su enfermedad.  Solicitar ayuda de inmediato si no mejora o empeora. Document Released: 04/25/2010 Document Revised: 04/02/2011 [facility] Patient Information 2014 [place]ExitCare, [place]LLC. Tia corporal  (Body Ringworm) La tia corporal (tinea corporis) es una infeccin por hongos en la piel del cuerpo. La causa de esta infeccin no son gusanos, sino un hongo. Los hongos normalmente viven en la superficie de la piel y pueden ser tiles. Sin embargo, en el caso de la [place] Point[place]ia, los hongos crecen de [place] Bon[place] Heightsmanera descontrolada y causan una infeccin en la piel.  Puede afectar a cualquier zona de la piel del cuerpo y puede propagarse fcilmente de Neomia Dearuna persona a otra (es contagiosa). La tia es un problema frecuente en los nios, pero tambin puede afectar a los adultos. Tambin generalmente la sufren los atletas, en especial en los luchadores que comparten equipos y colchonetas.  CAUSAS  La causa de la tia corporal es un hongo llamado dermatofito. Se puede propagar a travs de:   Contacto con Nucor Corporationotras personas infectadas.  Contacto con mascotas infectadas.  Tocar o compartir objetos que BorgWarnerhayan estado en contacto con una persona o con una mascota infectada (sombreros, peines, toallas, ropa, artculos deportivos). SNTOMAS   Picazn, manchas rojas elevadas o bultos en la piel.  Erupcin en forma de anillos.  Enrojecimiento cerca del borde de la erupcin con un centro claro.  Piel seca y escamosa dentro o alrededor de la erupcin. No todas las personas tienen una erupcin en forma de [place]. Algunos desarrollan slo manchas rojas y escamosas.  DIAGNSTICO  Generalmente, la tia puede diagnosticarse mediante la realizacin de un examen de la piel. El mdico puede optar por realizar un raspado de la piel de la zona afectada. La muestra se examinar con un microscopio para determinar si hay hongos. TRATAMIENTO  La tia corporal puede tratarse con una crema o ungento antifngico tpico. En algunos casos, se indica un champ antihongos para el cuerpo. Podrn recetarle medicamentos antimicticos para tomar por boca si la tia es grave, si reaparece o si se prolonga por mucho tiempo.  INSTRUCCIONES PARA EL CUIDADO EN EL HOGAR   Tome slo  medicamentos de venta libre o recetados, segn las indicaciones del mdico.  Verdie DrownLave el rea afectada y seque bien antes de aplicar la crema o la pomada.  Cuando use el champ antimictico para tratar la tia, deje el Levi Strausschamp sobre el cuerpo durante 3 a 5 minutos antes de enjuagar.   Use ropa suelta para evitar roces e  irritacin en la erupcin.  Lave o cambie sus sbanas cada noche mientras tiene la erupcin.  Si su mascota tiene la misma infeccin, hgalo tratar por un veterinario. Para prevenir la tia corporal:   Mantenga una buena higiene.  Use sandalias o zapatos en lugares pblicos y duchas.  No comparta artculos personales con Nucor Corporationotras personas.  Evite tocar las [address] West O[address] Streetmanchas rojas de piel de Economistotras personas.  Evite tocar las Auto-Owners Insurancemascotas que tienen zonas sin pelos o lvese las manos despus de tocarlo. SOLICITE ATENCIN MDICA SI:   La erupcin contina diseminndose despus de 7 das de Lebanontratamiento.  La erupcin no se cura en el trmino de 4 semanas.  El rea alrededor de la erupcin se vuelve roja, se hincha o duele. Document Released: 10/18/2004 Document Revised: 10/03/2011 [facility] LodgeExitCare Patient Information 2014 [place]ExitCare, MarylandLLC. Preventing Toenail Fungus from Recurring   Sanitize your shoes with Mycomist spray or a similar shoe sanitizer spray.  Follow the instructions on the bottle and dry them outside in the sun or with a hairdryer.  We also recommend repeating the sanitization once weekly in shoes you wear most often.   Throw away any shoes you have worn a significant amount without socks-fungus thrives in a warm moist environment and you want to avoid re-infection after your laser procedure   Bleach your socks with regular or color safe bleach   Change your socks regularly to keep your feet clean and dry (especially if you have sweaty feet)-if sweaty feet are a problem, let your doctor know-there is a great lotion that helps with this problem.   Clean your toenail clippers with alcohol before you use them if you do your own toenails and make sure to replace Emory boards and orange sticks regularly   If you get regular pedicures, bring your own instruments or go to a spa that sterilizes their instruments in an autoclave.

## 2013-05-20 LAB — LIPID PANEL
Cholesterol: 175 mg/dL (ref 0–200)
HDL: 38 mg/dL — ABNORMAL LOW (ref >39)
LDL CALC: 85 mg/dL (ref 0–99)
TRIGLYCERIDES: 260 mg/dL — AB (ref <150)
Total CHOL/HDL Ratio: 4.6 Ratio
VLDL: 52 mg/dL — AB (ref 0–40)

## 2013-05-20 LAB — HEPATIC FUNCTION PANEL
ALBUMIN: 4.2 g/dL (ref 3.5–5.2)
ALT: 23 U/L (ref 0–53)
AST: 18 U/L (ref 0–37)
Alkaline Phosphatase: 70 U/L (ref 39–117)
BILIRUBIN INDIRECT: 0.4 mg/dL (ref 0.2–1.2)
BILIRUBIN TOTAL: 0.5 mg/dL (ref 0.2–1.2)
Bilirubin, Direct: 0.1 mg/dL (ref 0.0–0.3)
TOTAL PROTEIN: 6.9 g/dL (ref 6.0–8.3)

## 2013-07-02 ENCOUNTER — Ambulatory Visit (INDEPENDENT_AMBULATORY_CARE_PROVIDER_SITE_OTHER): Payer: 59 | Admitting: Internal Medicine

## 2013-07-02 ENCOUNTER — Encounter: Payer: Self-pay | Admitting: Internal Medicine

## 2013-07-02 VITALS — BP 100/64 | HR 72 | Temp 98.1°F | Resp 16 | Ht 64.0 in | Wt 154.6 lb

## 2013-07-02 DIAGNOSIS — R030 Elevated blood-pressure reading, without diagnosis of hypertension: Secondary | ICD-10-CM

## 2013-07-02 DIAGNOSIS — Z79899 Other long term (current) drug therapy: Secondary | ICD-10-CM

## 2013-07-02 DIAGNOSIS — E782 Mixed hyperlipidemia: Secondary | ICD-10-CM

## 2013-07-02 DIAGNOSIS — E559 Vitamin D deficiency, unspecified: Secondary | ICD-10-CM

## 2013-07-02 DIAGNOSIS — E119 Type 2 diabetes mellitus without complications: Secondary | ICD-10-CM

## 2013-07-02 DIAGNOSIS — J0410 Acute tracheitis without obstruction: Secondary | ICD-10-CM

## 2013-07-02 LAB — CBC WITH DIFFERENTIAL/PLATELET
Basophils Absolute: 0 10*3/uL (ref 0.0–0.1)
Basophils Relative: 0 % (ref 0–1)
Eosinophils Absolute: 0.1 10*3/uL (ref 0.0–0.7)
Eosinophils Relative: 1 % (ref 0–5)
HEMATOCRIT: 45.2 % (ref 39.0–52.0)
HEMOGLOBIN: 15.5 g/dL (ref 13.0–17.0)
LYMPHS ABS: 1.7 10*3/uL (ref 0.7–4.0)
LYMPHS PCT: 26 % (ref 12–46)
MCH: 30.1 pg (ref 26.0–34.0)
MCHC: 34.3 g/dL (ref 30.0–36.0)
MCV: 87.8 fL (ref 78.0–100.0)
MONO ABS: 0.5 10*3/uL (ref 0.1–1.0)
MONOS PCT: 8 % (ref 3–12)
Neutro Abs: 4.2 10*3/uL (ref 1.7–7.7)
Neutrophils Relative %: 65 % (ref 43–77)
Platelets: 222 10*3/uL (ref 150–400)
RBC: 5.15 MIL/uL (ref 4.22–5.81)
RDW: 14.1 % (ref 11.5–15.5)
WBC: 6.5 10*3/uL (ref 4.0–10.5)

## 2013-07-02 MED ORDER — AZITHROMYCIN 250 MG PO TABS
ORAL_TABLET | ORAL | Status: DC
Start: 2013-07-02 — End: 2013-10-16

## 2013-07-02 MED ORDER — HYDROCODONE-ACETAMINOPHEN 5-325 MG PO TABS: ORAL_TABLET | ORAL | Status: DC

## 2013-07-02 MED ORDER — PREDNISONE 20 MG PO TABS
ORAL_TABLET | ORAL | Status: AC
Start: 2013-07-02 — End: 2013-08-01

## 2013-07-02 NOTE — Patient Instructions (Signed)
Bronquitis  (Bronchitis)  La bronquitis es una inflamación de las vías respiratorias que se extienden desde la tráquea hasta los pulmones (bronquios). A menudo, la inflamación produce la formación de mucosidad, lo que genera tos. Si la inflamación es grave, puede provocar falta de aire.  CAUSAS   Las causas de la bronquitis pueden ser:   · Infecciones virales.  · Bacterias.  · Humo del cigarrillo.  · Alérgenos, contaminantes y otros irritantes.  SIGNOS Y SÍNTOMAS   El síntoma más habitual de la bronquitis es la tos frecuente con mucosidad. Otros síntomas son:  · Fiebre.  · Dolores en el cuerpo.  · Congestión en el pecho.  · Escalofríos.  · Falta de aire.  · Dolor de garganta.  DIAGNÓSTICO   La bronquitis en general se diagnostica con la historia clínica y un examen físico. En algunos casos se indican otros estudios, como radiografías, para descartar otras enfermedades.   TRATAMIENTO   Tal vez deba evitar el contacto con la causa del problema (por ejemplo, el cigarrillo). En algunos casos es necesario administrar medicamentos. Estos pueden ser:  · Antibióticos. Tal vez se los receten si la causa de la bronquitis es una bacteria.  · Antitusivos. Tal vez se los receten para aliviar los síntomas de la tos.  · Medicamentos inhalados. Tal vez se los receten para liberar las vías respiratorias y facilitar la respiración.  · Medicamentos con corticoides. Tal vez se los receten si tiene bronquitis recurrente (crónica).  INSTRUCCIONES PARA EL CUIDADO EN EL HOGAR  · Descanse lo suficiente.  · Beba líquidos en abundancia para mantener la orina de color claro o amarillo pálido (excepto que padezca una enfermedad que requiera la restricción de líquidos). Tome mucho líquido para disolver las secreciones y evitar la deshidratación.  · Tome solo medicamentos de venta libre o recetados, según las indicaciones del médico.  · Tome los antibióticos exclusivamente según las indicaciones. Finalice la prescripción completa, aunque se  sienta mejor.  · Evite el humo de segunda mano, los químicos irritantes y los vapores fuertes. Estos agentes empeoran la bronquitis. Si es fumador, abandone el hábito. Considere el uso de goma de mascar o la aplicación de parches en la piel que contengan nicotina para aliviar los síntomas de abstinencia. Si deja de fumar, sus pulmones se curarán más rápido.  · Ponga un humidificador de vapor frío en la habitación por la noche para humedecer el aire. Puede ayudarlo a aflojar la mucosidad. Cambie el agua del humidificador a diario. También puede abrir el agua caliente de la ducha y sentarse en el baño con la puerta cerrada durante 5 a 10 minutos.  · Concurra a las consultas de control con su médico según las indicaciones.  · Lávese las manos con frecuencia para evitar contagiarse bronquitis nuevamente y para no extender la infección a otras personas.  SOLICITE ATENCIÓN MÉDICA SI:  Los síntomas no mejoran después de 1 semana de tratamiento.   SOLICITE ATENCIÓN MÉDICA DE INMEDIATO SI:  · La fiebre aumenta.  · Tiene escalofríos.  · Siente dolor en el pecho.  · Le empeora la falta el aire.  · La flema tiene sangre.  · Se desmaya.  · Tiene vahídos.  · Sufre un dolor intenso de cabeza.  · Vomita repetidas veces.  ASEGÚRESE DE QUE:   · Comprende estas instrucciones.  · Controlará su afección.  · Recibirá ayuda de inmediato si no mejora o si empeora.  Document Released: 01/08/2005 Document Revised: 10/29/2012  ExitCare® Patient Information ©2014 ExitCare, LLC.

## 2013-07-02 NOTE — Progress Notes (Signed)
Patient ID: Dutch GrayJacobo G Matin, male   DOB: 02/17/1965, 48 y.o.   MRN: [id number]    This very nice 48 y.o. male presents for 3 month follow up with T2_NIDDM, Hyperlipidemia and Vitamin D Deficiency. Patient also reports recent head & chest congestion and cough at night .   BP is monitored expectantly Today's BP: 100/64 mmHg. Patient denies any cardiac type chest pain, palpitations, dyspnea/orthopnea/PND, dizziness, claudication, or dependent edema.    Hyperlipidemia is controlled with diet & meds. Last lipids in apr 2015 were at goal. Patient denies myalgias or other med SE's.  Lab Results  Component Value Date   CHOL 175 05/19/2013   HDL 38* 05/19/2013   LDLCALC 85 05/19/2013   TRIG 260* 05/19/2013   CHOLHDL 4.6 05/19/2013    Also, the patient has history of T2_NIDDM currently monitored with diet with  A1c 6.7 in Sept 2014 and 6.9% in Dec 2014. Patient denies any symptoms of reactive hypoglycemia, diabetic polys, paresthesias or visual blurring.   Further, Patient has history of Vitamin D Deficiency with vitamin D of 31 in Jan 2014. Patient supplements vitamin D without any suspected side-effects.   Medication List   azithromycin 250 MG tablet - given today  Commonly known as:  ZITHROMAX  Take 2 tablets (500 mg) on  Day 1,  followed by 1 tablet (250 mg) once daily on Days 2 through 5.     HYDROcodone-acetaminophen 5-325 MG per tablet - given today  Commonly known as:  NORCO  Take 1/2 to 1 tablet every 3 to 4 hours as needed to prevent cough     lovastatin 20 MG tablet  Commonly known as:  MEVACOR  Take 1 tablet (20 mg total) by mouth at bedtime.     predniSONE 20 MG tablet - given today  Commonly known as:  DELTASONE  1 tab 3 x day for 2 days, then 1 tab 2 x day for 2 days, then 1 tab 1 x day for 3 days     Vitamin D-3 5000 UNITS Tabs  Take 5,000 Units by mouth daily.       Allergies  Allergen Reactions  . Poison Ivy Extract [Extract Of Poison Ivy] Rash   PMHx:   Past Medical  History  Diagnosis Date  . Hyperlipidemia   . Vitamin D deficiency   . Diabetes mellitus without complication     FHx:    Reviewed / unchanged  SHx:    Reviewed / unchanged   Systems Review: Constitutional: Denies fever, chills, wt changes, headaches, insomnia, fatigue, night sweats, change in appetite. Eyes: Denies redness, blurred vision, diplopia, discharge, itchy, watery eyes.  ENT: Denies discharge, congestion, post nasal drip, epistaxis, sore throat, earache, hearing loss, dental pain, tinnitus, vertigo, sinus pain, snoring.  CV: Denies chest pain, palpitations, irregular heartbeat, syncope, dyspnea, diaphoresis, orthopnea, PND, claudication or edema. Respiratory: denies cough, dyspnea, DOE, pleurisy, hoarseness, laryngitis, wheezing.  Gastrointestinal: Denies dysphagia, odynophagia, heartburn, reflux, water brash, abdominal pain or cramps, nausea, vomiting, bloating, diarrhea, constipation, hematemesis, melena, hematochezia  or hemorrhoids. Genitourinary: Denies dysuria, frequency, urgency, nocturia, hesitancy, discharge, hematuria or flank pain. Musculoskeletal: Denies arthralgias, myalgias, stiffness, jt. swelling, pain, limping or strain/sprain.  Skin: Denies pruritus, rash, hives, warts, acne, eczema or change in skin lesion(s). Neuro: No weakness, tremor, incoordination, spasms, paresthesia or pain. Psychiatric: Denies confusion, memory loss or sensory loss. Endo: Denies change in weight, skin or hair change.  Heme/Lymph: No excessive bleeding, bruising or enlarged lymph nodes.   Exam:  BP 100/64  Pulse 72  Temp(Src) 98.1 F (36.7 C) (Temporal)  Resp 16  Ht 5\' 4"  (1.626 m)  Wt 154 lb 9.6 oz (70.126 kg)  BMI 26.52 kg/m2  Appears well nourished - in no distress. Eyes: PERRLA, EOMs, conjunctiva no swelling or erythema. Sinuses: Sl frontal/maxillary tenderness ENT/Mouth: EAC's clear, TM's nl w/o erythema, bulging. Nares clear w/o erythema, swelling, exudates.  Oropharynx clear without erythema or exudates. Oral hygiene is good. Tongue normal, non obstructing. Hearing intact.  Neck: Supple. Thyroid nl. Car 2+/2+ without bruits, nodes or JVD. Chest: Respirations nl with BS  equal w/few rales, rhonchi, wheezing or stridor.  (+) dry cough. Cor: Heart sounds normal w/ regular rate and rhythm without sig. murmurs, gallops, clicks, or rubs. Peripheral pulses normal and equal  without edema.  Abdomen: Soft & bowel sounds normal. Non-tender w/o guarding, rebound, hernias, masses, or organomegaly.  Lymphatics: Unremarkable.  Musculoskeletal: Full ROM all peripheral extremities, joint stability, 5/5 strength, and normal gait.  Skin: Warm, dry without exposed rashes, lesions or ecchymosis apparent.  Neuro: Cranial nerves intact, reflexes equal bilaterally. Sensory-motor testing grossly intact. Tendon reflexes grossly intact.  Pysch: Alert & oriented x 3. Insight and judgement nl & appropriate. No ideations.  Assessment and Plan:  1. T2_NIDDM - continue recommend prudent low glycemic diet, weight control, regular exercise,  & diabetic monitoring.  2. Hyperlipidemia - Continue diet/meds, exercise,& lifestyle modifications. Continue monitor periodic cholesterol/liver & renal functions   4. Vitamin D Deficiency - Continue supplementation.  5. Sinusitis / Tracheitis - Rx: Z pak, Prednisone & Norco.  Recommended regular exercise, BP monitoring, weight control, and discussed med and SE's. Recommended labs to assess and monitor clinical status. Further disposition pending results of labs.

## 2013-07-03 LAB — TSH: TSH: 0.98 u[IU]/mL (ref 0.350–4.500)

## 2013-07-03 LAB — BASIC METABOLIC PANEL WITH GFR
BUN: 13 mg/dL (ref 6–23)
CHLORIDE: 102 meq/L (ref 96–112)
CO2: 27 mEq/L (ref 19–32)
Calcium: 9.8 mg/dL (ref 8.4–10.5)
Creat: 0.84 mg/dL (ref 0.50–1.35)
GFR, Est African American: >89 mL/min
GLUCOSE: 119 mg/dL — AB (ref 70–99)
POTASSIUM: 4 meq/L (ref 3.5–5.3)
Sodium: 139 mEq/L (ref 135–145)

## 2013-07-03 LAB — LIPID PANEL
Cholesterol: 159 mg/dL (ref 0–200)
HDL: 40 mg/dL (ref >39)
LDL Cholesterol: 82 mg/dL (ref 0–99)
TRIGLYCERIDES: 183 mg/dL — AB (ref <150)
Total CHOL/HDL Ratio: 4 Ratio
VLDL: 37 mg/dL (ref 0–40)

## 2013-07-03 LAB — MAGNESIUM: Magnesium: 2.3 mg/dL (ref 1.5–2.5)

## 2013-07-03 LAB — HEPATIC FUNCTION PANEL
ALK PHOS: 85 U/L (ref 39–117)
ALT: 19 U/L (ref 0–53)
AST: 18 U/L (ref 0–37)
Albumin: 4.2 g/dL (ref 3.5–5.2)
BILIRUBIN INDIRECT: 0.5 mg/dL (ref 0.2–1.2)
Bilirubin, Direct: 0.1 mg/dL (ref 0.0–0.3)
TOTAL PROTEIN: 7 g/dL (ref 6.0–8.3)
Total Bilirubin: 0.6 mg/dL (ref 0.2–1.2)

## 2013-07-03 LAB — HEMOGLOBIN A1C
Hgb A1c MFr Bld: 6.5 % — ABNORMAL HIGH (ref <5.7)
MEAN PLASMA GLUCOSE: 140 mg/dL — AB (ref <117)

## 2013-07-03 LAB — VITAMIN D 25 HYDROXY (VIT D DEFICIENCY, FRACTURES): Vit D, 25-Hydroxy: 52 ng/mL (ref 30–89)

## 2013-07-03 LAB — INSULIN, FASTING: Insulin fasting, serum: 139 u[IU]/mL — ABNORMAL HIGH (ref 3–28)

## 2013-10-02 ENCOUNTER — Ambulatory Visit: Payer: Self-pay | Admitting: Physician Assistant

## 2013-10-16 ENCOUNTER — Ambulatory Visit (INDEPENDENT_AMBULATORY_CARE_PROVIDER_SITE_OTHER): Payer: 59 | Admitting: Physician Assistant

## 2013-10-16 ENCOUNTER — Encounter: Payer: Self-pay | Admitting: Physician Assistant

## 2013-10-16 VITALS — BP 102/60 | HR 68 | Temp 98.2°F | Resp 16 | Ht 64.0 in | Wt 156.0 lb

## 2013-10-16 DIAGNOSIS — E559 Vitamin D deficiency, unspecified: Secondary | ICD-10-CM

## 2013-10-16 DIAGNOSIS — Z79899 Other long term (current) drug therapy: Secondary | ICD-10-CM

## 2013-10-16 DIAGNOSIS — R030 Elevated blood-pressure reading, without diagnosis of hypertension: Secondary | ICD-10-CM

## 2013-10-16 DIAGNOSIS — E782 Mixed hyperlipidemia: Secondary | ICD-10-CM

## 2013-10-16 DIAGNOSIS — E119 Type 2 diabetes mellitus without complications: Secondary | ICD-10-CM

## 2013-10-16 LAB — CBC WITH DIFFERENTIAL/PLATELET
BASOS PCT: 1 % (ref 0–1)
Basophils Absolute: 0.1 10*3/uL (ref 0.0–0.1)
EOS ABS: 0.1 10*3/uL (ref 0.0–0.7)
Eosinophils Relative: 2 % (ref 0–5)
HCT: 47 % (ref 39.0–52.0)
Hemoglobin: 16.3 g/dL (ref 13.0–17.0)
Lymphocytes Relative: 26 % (ref 12–46)
Lymphs Abs: 1.6 10*3/uL (ref 0.7–4.0)
MCH: 30.6 pg (ref 26.0–34.0)
MCHC: 34.7 g/dL (ref 30.0–36.0)
MCV: 88.3 fL (ref 78.0–100.0)
MONOS PCT: 7 % (ref 3–12)
Monocytes Absolute: 0.4 10*3/uL (ref 0.1–1.0)
Neutro Abs: 3.9 10*3/uL (ref 1.7–7.7)
Neutrophils Relative %: 64 % (ref 43–77)
PLATELETS: 220 10*3/uL (ref 150–400)
RBC: 5.32 MIL/uL (ref 4.22–5.81)
RDW: 14 % (ref 11.5–15.5)
WBC: 6.1 10*3/uL (ref 4.0–10.5)

## 2013-10-16 LAB — BASIC METABOLIC PANEL WITH GFR
BUN: 13 mg/dL (ref 6–23)
CALCIUM: 9.7 mg/dL (ref 8.4–10.5)
CO2: 24 meq/L (ref 19–32)
CREATININE: 0.93 mg/dL (ref 0.50–1.35)
Chloride: 101 mEq/L (ref 96–112)
GFR, Est African American: >89 mL/min
GFR, Est Non African American: >89 mL/min
GLUCOSE: 114 mg/dL — AB (ref 70–99)
Potassium: 3.9 mEq/L (ref 3.5–5.3)
Sodium: 137 mEq/L (ref 135–145)

## 2013-10-16 LAB — LIPID PANEL
Cholesterol: 201 mg/dL — ABNORMAL HIGH (ref 0–200)
HDL: 46 mg/dL (ref >39)
LDL Cholesterol: 108 mg/dL — ABNORMAL HIGH (ref 0–99)
Total CHOL/HDL Ratio: 4.4 Ratio
Triglycerides: 235 mg/dL — ABNORMAL HIGH (ref <150)
VLDL: 47 mg/dL — ABNORMAL HIGH (ref 0–40)

## 2013-10-16 LAB — HEMOGLOBIN A1C
HEMOGLOBIN A1C: 6.8 % — AB (ref <5.7)
MEAN PLASMA GLUCOSE: 148 mg/dL — AB (ref <117)

## 2013-10-16 LAB — HEPATIC FUNCTION PANEL
ALBUMIN: 4.5 g/dL (ref 3.5–5.2)
ALT: 24 U/L (ref 0–53)
AST: 19 U/L (ref 0–37)
Alkaline Phosphatase: 85 U/L (ref 39–117)
Bilirubin, Direct: 0.1 mg/dL (ref 0.0–0.3)
Indirect Bilirubin: 0.6 mg/dL (ref 0.2–1.2)
TOTAL PROTEIN: 6.8 g/dL (ref 6.0–8.3)
Total Bilirubin: 0.7 mg/dL (ref 0.2–1.2)

## 2013-10-16 LAB — TSH: TSH: 1.146 u[IU]/mL (ref 0.350–4.500)

## 2013-10-16 LAB — MAGNESIUM: Magnesium: 2.2 mg/dL (ref 1.5–2.5)

## 2013-10-16 MED ORDER — LOVASTATIN 20 MG PO TABS: 20.0000 mg | ORAL_TABLET | Freq: Every day | ORAL | Status: DC

## 2013-10-16 MED ORDER — CLOTRIMAZOLE-BETAMETHASONE 1-0.05 % EX CREA: 1.0000 "application " | TOPICAL_CREAM | Freq: Two times a day (BID) | CUTANEOUS | Status: DC

## 2013-10-16 NOTE — Progress Notes (Signed)
Assessment and Plan:  Hypertension: Continue medication, monitor blood pressure at home. Continue DASH diet. Cholesterol: Continue diet and exercise. Check cholesterol.  Diabetes-Continue diet and exercise. Check A1C Vitamin D Def- check level and continue medications.   Continue diet and meds as discussed. Further disposition pending results of labs. Discussed med's effects and SE's.    HPI 48 y.o. male  presents for 3 month follow up with hypertension, hyperlipidemia, diabetes and vitamin D. His blood pressure has been controlled at home, today their BP is BP: 102/60 mmHg He does not workout but he is very active. He denies chest pain, shortness of breath, dizziness.  He is on cholesterol medication and denies myalgias. His cholesterol is at goal. The cholesterol last visit was:   Lab Results  Component Value Date   CHOL 159 07/02/2013   HDL 40 07/02/2013   LDLCALC 82 07/02/2013   TRIG 183* 07/02/2013   CHOLHDL 4.0 07/02/2013   He has been working on diet and exercise for Diabetes, he is not on ACE due to lower BP, he is not on bASA, and denies paresthesia of the feet, polydipsia and polyuria. Last A1C in the office was:  Lab Results  Component Value Date   HGBA1C 6.5* 07/02/2013   Patient is on Vitamin D supplement. Lab Results  Component Value Date   VD25OH 52 07/02/2013      Current Medications:  Current Outpatient Prescriptions on File Prior to Visit  Medication Sig Dispense Refill  . Cholecalciferol (VITAMIN D-3) 5000 UNITS TABS Take 5,000 Units by mouth daily.      Marland Kitchen lovastatin (MEVACOR) 20 MG tablet Take 1 tablet (20 mg total) by mouth at bedtime.  30 tablet  3   No current facility-administered medications on file prior to visit.   Medical History:  Past Medical History  Diagnosis Date  . Hyperlipidemia   . Vitamin D deficiency   . Diabetes mellitus without complication    Allergies:  Allergies  Allergen Reactions  . Poison Ivy Extract [Extract Of Poison Ivy]  Rash     Review of Systems:  = complains of   = denies  General: Fatigue  Fever  Chills  Weakness   Insomnia  Eyes: Redness  Blurred vision  Diplopia   ENT: Congestion  Sinus Pain  Post Nasal Drip  Sore Throat  Earache   Cardiac: Chest pain/pressure  SOB  Orthopnea   Palpitations   Paroxysmal nocturnal dyspnea[ ]  Claudication  Edema   Pulmonary: Cough  Wheezing[ ]   SOB   Snoring   GI: Nausea  Vomiting[ ]  Dysphagia[ ]  Heartburn[ ]  Abdominal pain  Constipation ; Diarrhea ; BRBPR  Melena[ ]  GU: Hematuria[ ]  Dysuria  Nocturia[ ]  Urgency   Hesitancy  Discharge  Neuro: Headaches[ ]  Vertigo[ ]  Paresthesias[ ]  Spasm  Speech changes  Incoordination   Ortho: Arthritis  Joint pain  Muscle pain  Joint swelling  Back Pain  Skin:  Rash   Pruritis  Change in skin lesion   Psych: Depression[ ]  Anxiety[ ]  Confusion  Memory loss   Heme/Lypmh: Bleeding  Bruising  Enlarged lymph nodes   Endocrine: Visual blurring  Paresthesia  Polyuria  Polydypsea   Heat/cold intolerance  Hypoglycemia   Family history- Review and unchanged Social history- Review and unchanged Physical Exam: BP 102/60  Pulse 68  Temp(Src) 98.2 F (36.8 C)  Resp 16  Ht  (1.626 m)  Wt 156 lb (70.761 kg)  BMI 26.76 kg/m2 Wt Readings from Last 3 Encounters:  10/16/13 156 lb (70.761 kg)  07/02/13 154 lb 9.6 oz (70.126 kg)  05/19/13 154 lb (69.854 kg)   General Appearance: Well nourished, in no apparent distress. Eyes: PERRLA, EOMs, conjunctiva no swelling or erythema Sinuses: No Frontal/maxillary tenderness ENT/Mouth: Ext aud canals clear, TMs without erythema, bulging. No erythema, swelling, or exudate on post pharynx.  Tonsils not swollen or erythematous. Hearing normal.  Neck: Supple, thyroid normal.  Respiratory: Respiratory effort normal, BS equal bilaterally  without rales, rhonchi, wheezing or stridor.  Cardio: RRR with no MRGs. Brisk peripheral pulses without edema.  Abdomen: Soft, + BS.  Non tender, no guarding, rebound, hernias, masses. Lymphatics: Non tender without lymphadenopathy.  Musculoskeletal: Full ROM, 5/5 strength, normal gait.  Skin: Warm, dry without rashes, lesions, ecchymosis.  Neuro: Cranial nerves intact. No cerebellar symptoms. Sensation intact.  Psych: Awake and oriented X 3, normal affect, Insight and Judgment appropriate.    Quentin Mulling 11:43 AM

## 2013-10-16 NOTE — Patient Instructions (Signed)
Add low dose aspirin,      Bad carbs also include fruit juice, alcohol, and sweet tea. These are empty calories that do not signal to your brain that you are full.   Please remember the good carbs are still carbs which convert into sugar. So please measure them out no more than 1/2-1 cup of rice, oatmeal, pasta, and beans.  Veggies are however free foods! Pile them on.   I like lean protein at every meal such as chicken, [place], pork chops, cottage cheese, etc. Just do not fry these meats and please center your meal around vegetable, the meats should be a side dish.   No all fruit is created equal. Please see the list below, the fruit at the bottom is higher in sugars than the fruit at the top   Recommendations For Diabetic Patients:   -  Take medications as prescribed  -  Recommend Dr Francis Dowse Fuhrman's book "The End of Diabetes " - Can get at  www.Amazon.com and encourage also get the Audio CD book  - AVOID Animal products, ie. Meat - red/white, Poultry and Dairy/especially cheese - Exercise at least 5 times a week for 30 minutes or preferably daily.  - No Smoking - Drink less than 2 drinks a day.  - Monitor your feet for sores - Have yearly Eye Exams - Recommend annual Flu vaccine  - Recommend Pneumovax and Prevnar vaccines - Shingles Vaccine (Zostavax) if over 29 y.o.  Goals:   - BMI less than 24 - Fasting sugar less than 130 or less than 150 if tapering medicines to lose weight  - Systolic BP less than 130  - Diastolic BP less than 80 - Bad LDL Cholesterol less than 70 - Triglycerides less than 150

## 2013-10-17 LAB — INSULIN, FASTING: INSULIN FASTING, SERUM: 39.1 u[IU]/mL — AB (ref 2.0–19.6)

## 2013-10-17 LAB — VITAMIN D 25 HYDROXY (VIT D DEFICIENCY, FRACTURES): VIT D 25 HYDROXY: 37 ng/mL (ref 30–89)

## 2013-12-29 ENCOUNTER — Encounter: Payer: Self-pay | Admitting: Internal Medicine

## 2013-12-29 ENCOUNTER — Ambulatory Visit (INDEPENDENT_AMBULATORY_CARE_PROVIDER_SITE_OTHER): Payer: 59 | Admitting: Internal Medicine

## 2013-12-29 VITALS — BP 108/70 | HR 64 | Temp 98.6°F | Resp 16 | Ht 64.5 in | Wt 151.0 lb

## 2013-12-29 DIAGNOSIS — R7303 Prediabetes: Secondary | ICD-10-CM

## 2013-12-29 DIAGNOSIS — Z113 Encounter for screening for infections with a predominantly sexual mode of transmission: Secondary | ICD-10-CM

## 2013-12-29 DIAGNOSIS — Z0001 Encounter for general adult medical examination with abnormal findings: Secondary | ICD-10-CM

## 2013-12-29 DIAGNOSIS — Z111 Encounter for screening for respiratory tuberculosis: Secondary | ICD-10-CM

## 2013-12-29 DIAGNOSIS — E119 Type 2 diabetes mellitus without complications: Secondary | ICD-10-CM

## 2013-12-29 DIAGNOSIS — Z23 Encounter for immunization: Secondary | ICD-10-CM

## 2013-12-29 DIAGNOSIS — Z125 Encounter for screening for malignant neoplasm of prostate: Secondary | ICD-10-CM

## 2013-12-29 DIAGNOSIS — R7989 Other specified abnormal findings of blood chemistry: Secondary | ICD-10-CM

## 2013-12-29 DIAGNOSIS — E559 Vitamin D deficiency, unspecified: Secondary | ICD-10-CM

## 2013-12-29 DIAGNOSIS — E782 Mixed hyperlipidemia: Secondary | ICD-10-CM

## 2013-12-29 DIAGNOSIS — R945 Abnormal results of liver function studies: Secondary | ICD-10-CM

## 2013-12-29 DIAGNOSIS — R030 Elevated blood-pressure reading, without diagnosis of hypertension: Secondary | ICD-10-CM

## 2013-12-29 DIAGNOSIS — R7309 Other abnormal glucose: Secondary | ICD-10-CM

## 2013-12-29 DIAGNOSIS — Z1212 Encounter for screening for malignant neoplasm of rectum: Secondary | ICD-10-CM

## 2013-12-29 DIAGNOSIS — R6889 Other general symptoms and signs: Secondary | ICD-10-CM

## 2013-12-29 LAB — HEMOGLOBIN A1C
HEMOGLOBIN A1C: 6.6 % — AB (ref <5.7)
MEAN PLASMA GLUCOSE: 143 mg/dL — AB (ref <117)

## 2013-12-29 LAB — CBC WITH DIFFERENTIAL/PLATELET
BASOS PCT: 1 % (ref 0–1)
Basophils Absolute: 0.1 10*3/uL (ref 0.0–0.1)
Eosinophils Absolute: 0.1 10*3/uL (ref 0.0–0.7)
Eosinophils Relative: 2 % (ref 0–5)
HEMATOCRIT: 46.5 % (ref 39.0–52.0)
HEMOGLOBIN: 16.1 g/dL (ref 13.0–17.0)
LYMPHS ABS: 1.6 10*3/uL (ref 0.7–4.0)
LYMPHS PCT: 27 % (ref 12–46)
MCH: 30.3 pg (ref 26.0–34.0)
MCHC: 34.6 g/dL (ref 30.0–36.0)
MCV: 87.4 fL (ref 78.0–100.0)
MPV: 11.2 fL (ref 9.4–12.4)
Monocytes Absolute: 0.5 10*3/uL (ref 0.1–1.0)
Monocytes Relative: 8 % (ref 3–12)
NEUTROS ABS: 3.7 10*3/uL (ref 1.7–7.7)
NEUTROS PCT: 62 % (ref 43–77)
Platelets: 221 10*3/uL (ref 150–400)
RBC: 5.32 MIL/uL (ref 4.22–5.81)
RDW: 13.5 % (ref 11.5–15.5)
WBC: 6 10*3/uL (ref 4.0–10.5)

## 2013-12-29 NOTE — Progress Notes (Signed)
Patient ID: Victor Mcintyre, male   DOB: 10/20/1965, 48 y.o.   MRN: [id number]  Annual Screening Comprehensive Examination  This very nice 48 y.o.MM of [place] descent who  presents for complete physical.  Patient has been followed for labile HTN, T2_NIDDM, Hyperlipidemia, and Vitamin D Deficiency.   Patient has hx/o elevated BP and has been monitored expectantly for HTN.  Patient's BP has been controlled and today's BP: 108/70 mmHg. Patient denies any cardiac symptoms as chest pain, palpitations, shortness of breath, dizziness or ankle swelling.   Patient's hyperlipidemia is controlled with diet and medications. Patient denies myalgias or other medication SE's. Last lipids were near, but not quite at goal  - Total Chol 201; HDL  46; LDL  10*; Trig 235 on 10/16/2013:   Patient has T2_NIDDM  With A1c 6.5% & 6.7% in 2013 - being currently attempting management with diet and patient denies reactive hypoglycemic symptoms, visual blurring, diabetic polys or paresthesias. Last A1c was 6.8%  on  10/16/2013.   Finally, patient has history of Vitamin D Deficiency of 28 in Nov 2013  and on no supplementations his  last vitamin D was still bvery low at  37 on  10/16/2013.  Medication Sig  . lovastatin (MEVACOR) 20 MG tablet Take 1 tablet (20 mg total) by mouth at bedtime.   Allergies  Allergen Reactions  . Poison Ivy Extract [Extract Of Poison Ivy] Rash   Past Medical History  Diagnosis Date  . Hyperlipidemia   . Vitamin D deficiency   . Diabetes mellitus without complication    Health Maintenance  Topic Date Due  . FOOT EXAM  01/24/1977  . OPHTHALMOLOGY EXAM  01/24/1977  . INFLUENZA VACCINE  08/22/2013  . URINE MICROALBUMIN  12/29/2013  . HEMOGLOBIN A1C  04/16/2014  . PNEUMOCOCCAL POLYSACCHARIDE VACCINE (2) 12/10/2016  . TETANUS/TDAP  12/10/2021   Immunization History  Administered Date(s) Administered  . Influenza Split 12/29/2013  . PPD Test 12/29/2012, 12/29/2013  . Pneumococcal  Polysaccharide-23 12/11/2011  . Tdap 12/11/2011   Past Surgical History  Procedure Laterality Date  . Lithotripsy  2002   Family History  Problem Relation Age of Onset  . Cancer Mother   . Diabetes Father    History   Social History  . Marital Status: Single    Spouse Name: N/A    Number of Children: N/A  . Years of Education: N/A   Occupational History  . Not on file.   Social History Main Topics  . Smoking status: Never Smoker   . Smokeless tobacco: Not on file  . Alcohol Use: Yes     Comment: rarely drinks  . Drug Use: No  . Sexual Activity: Not on file   Other Topics Concern  . Not on file   Social History Narrative    ROS Constitutional: Denies fever, chills, weight loss/gain, headaches, insomnia, fatigue, night sweats or change in appetite. Eyes: Denies redness, blurred vision, diplopia, discharge, itchy or watery eyes.  ENT: Denies discharge, congestion, post nasal drip, epistaxis, sore throat, earache, hearing loss, dental pain, Tinnitus, Vertigo, Sinus pain or snoring.  Cardio: Denies chest pain, palpitations, irregular heartbeat, syncope, dyspnea, diaphoresis, orthopnea, PND, claudication or edema Respiratory: denies cough, dyspnea, DOE, pleurisy, hoarseness, laryngitis or wheezing.  Gastrointestinal: Denies dysphagia, heartburn, reflux, water brash, pain, cramps, nausea, vomiting, bloating, diarrhea, constipation, hematemesis, melena, hematochezia, jaundice or hemorrhoids Genitourinary: Denies dysuria, frequency, urgency, nocturia, hesitancy, discharge, hematuria or flank pain Musculoskeletal: Denies arthralgia, myalgia, stiffness, Jt. Swelling, pain, limp  or strain/sprain. Denies Falls. Skin: Denies puritis, rash, hives, warts, acne, eczema or change in skin lesion Neuro: No weakness, tremor, incoordination, spasms, paresthesia or pain Psychiatric: Denies confusion, memory loss or sensory loss. Denies Depression. Endocrine: Denies change in weight, skin,  hair change, nocturia, and paresthesia, diabetic polys, visual blurring or hyper / hypo glycemic episodes.  Heme/Lymph: No excessive bleeding, bruising or enlarged lymph nodes.  Physical Exam  BP 108/70   Pulse 64  Temp 98.6 F  Resp 16  Ht 5' 4.5"   Wt 151 lb   BMI 25.53   General Appearance: Well nourished, in no apparent distress. Eyes: PERRLA, EOMs, conjunctiva no swelling or erythema, normal fundi and vessels. Sinuses: No frontal/maxillary tenderness ENT/Mouth: EACs patent / TMs  nl. Nares clear without erythema, swelling, mucoid exudates. Oral hygiene is good. No erythema, swelling, or exudate. Tongue normal, non-obstructing. Tonsils not swollen or erythematous. Hearing normal.  Neck: Supple, thyroid normal. No bruits, nodes or JVD. Respiratory: Respiratory effort normal.  BS equal and clear bilateral without rales, rhonci, wheezing or stridor. Cardio: Heart sounds are normal with regular rate and rhythm and no murmurs, rubs or gallops. Peripheral pulses are normal and equal bilaterally without edema. No aortic or femoral bruits. Chest: symmetric with normal excursions and percussion.  Abdomen: Flat, soft, with bowel sounds. Nontender, no guarding, rebound, hernias, masses, or organomegaly.  Lymphatics: Non tender without lymphadenopathy.  Genitourinary: No hernias.Testes nl. DRE - prostate nl for age - smooth & firm w/o nodules. Musculoskeletal: Full ROM all peripheral extremities, joint stability, 5/5 strength, and normal gait. Skin: Warm and dry without rashes, lesions, cyanosis, clubbing or  ecchymosis.  Neuro: Cranial nerves intact, reflexes equal bilaterally. Normal muscle tone, no cerebellar symptoms. Sensation intact.  Pysch: Awake and oriented X 3with normal affect, insight and judgment appropriate.   Assessment and Plan  1. Encounter for general adult medical examination with abnormal findings  - Urine Microscopic - Vitamin B12 - Testosterone - Iron and TIBC - CBC  with Differential - BASIC METABOLIC PANEL WITH GFR - Hepatic function panel - Magnesium - TSH   2. Elevated blood pressure reading without diagnosis of hypertension  - Microalbumin / creatinine urine ratio - EKG 12-Lead  3. Mixed hyperlipidemia  - Lipid panel  4. Vitamin D deficiency  - Vit D  25 hydroxy   5. Screening for rectal cancer  -  Hemoccult   6. Screening for prostate cancer  - PSA  7. VD (venereal disease) screening   8. Abnormal LFTs   9. Screening-pulmonary TB  - Quantiferon tb Gold Assay  10. T2_NIDDM  - attempting dietary control or cure   - Hemoglobin A1c - Insulin, fasting  11. Need for prophylactic vaccination and inoculation against influenza  - Flu vaccine   12. Screening examination for pulmonary tuberculosis  - PPD    Continue prudent diet as discussed, weight control, BP monitoring, regular exercise, and medications as discussed.  Discussed med effects and SE's. Routine screening labs and tests as requested with regular follow-up as recommended.

## 2013-12-30 LAB — IRON AND TIBC
%SAT: 52 % (ref 20–55)
Iron: 169 ug/dL — ABNORMAL HIGH (ref 42–165)
TIBC: 327 ug/dL (ref 215–435)
UIBC: 158 ug/dL (ref 125–400)

## 2013-12-30 LAB — MAGNESIUM: Magnesium: 2 mg/dL (ref 1.5–2.5)

## 2013-12-30 LAB — BASIC METABOLIC PANEL WITH GFR
BUN: 12 mg/dL (ref 6–23)
CHLORIDE: 102 meq/L (ref 96–112)
CO2: 27 meq/L (ref 19–32)
Calcium: 9.5 mg/dL (ref 8.4–10.5)
Creat: 0.81 mg/dL (ref 0.50–1.35)
GFR, Est Non African American: >89 mL/min
Glucose, Bld: 100 mg/dL — ABNORMAL HIGH (ref 70–99)
Potassium: 4.3 mEq/L (ref 3.5–5.3)
SODIUM: 139 meq/L (ref 135–145)

## 2013-12-30 LAB — TESTOSTERONE: Testosterone: 258 ng/dL — ABNORMAL LOW (ref 300–890)

## 2013-12-30 LAB — URINALYSIS, MICROSCOPIC ONLY
Bacteria, UA: NONE SEEN
Casts: NONE SEEN
Crystals: NONE SEEN
SQUAMOUS EPITHELIAL / LPF: NONE SEEN

## 2013-12-30 LAB — HEPATIC FUNCTION PANEL
ALK PHOS: 74 U/L (ref 39–117)
ALT: 32 U/L (ref 0–53)
AST: 25 U/L (ref 0–37)
Albumin: 4.3 g/dL (ref 3.5–5.2)
BILIRUBIN DIRECT: 0.2 mg/dL (ref 0.0–0.3)
Indirect Bilirubin: 0.7 mg/dL (ref 0.2–1.2)
TOTAL PROTEIN: 6.7 g/dL (ref 6.0–8.3)
Total Bilirubin: 0.9 mg/dL (ref 0.2–1.2)

## 2013-12-30 LAB — LIPID PANEL
CHOL/HDL RATIO: 3.7 ratio
CHOLESTEROL: 185 mg/dL (ref 0–200)
HDL: 50 mg/dL (ref >39)
LDL CALC: 111 mg/dL — AB (ref 0–99)
Triglycerides: 122 mg/dL (ref <150)
VLDL: 24 mg/dL (ref 0–40)

## 2013-12-30 LAB — VITAMIN D 25 HYDROXY (VIT D DEFICIENCY, FRACTURES): Vit D, 25-Hydroxy: 21 ng/mL — ABNORMAL LOW (ref 30–100)

## 2013-12-30 LAB — MICROALBUMIN / CREATININE URINE RATIO
CREATININE, URINE: 275.3 mg/dL
MICROALB/CREAT RATIO: 9.1 mg/g (ref 0.0–30.0)
Microalb, Ur: 2.5 mg/dL — ABNORMAL HIGH (ref <2.0)

## 2013-12-30 LAB — VITAMIN B12: Vitamin B-12: 469 pg/mL (ref 211–911)

## 2013-12-30 LAB — TSH: TSH: 1.978 u[IU]/mL (ref 0.350–4.500)

## 2013-12-30 LAB — INSULIN, FASTING: Insulin fasting, serum: 2.9 u[IU]/mL (ref 2.0–19.6)

## 2013-12-30 LAB — PSA: PSA: 0.56 ng/mL (ref <=4.00)

## 2013-12-31 LAB — QUANTIFERON TB GOLD ASSAY (BLOOD)
INTERFERON GAMMA RELEASE ASSAY: NEGATIVE
QUANTIFERON TB AG MINUS NIL: 0.01 [IU]/mL
Quantiferon Nil Value: 0.04 IU/mL
TB Ag value: 0.05 IU/mL

## 2013-12-31 LAB — TB SKIN TEST
Induration: 0 mm
TB Skin Test: NEGATIVE

## 2014-01-04 ENCOUNTER — Telehealth: Payer: Self-pay

## 2014-01-04 NOTE — Telephone Encounter (Signed)
-----   Message from Lucky CowboyWilliam McKeown, MD sent at 01/03/2014 11:41 PM EST ----- - B12 - Nl/OK - PSA - NL/OK -Testosterone Low - will recheck at next OV - Chol 185 - great - much better - keep up the great work - A1c better from 6.8% down to 6.6% - keep up the great work with the diet - Vit D extremely low - Recc Vit D 10,000 u/day (5,000 x 2 = 10,000 u/da)

## 2014-01-04 NOTE — Telephone Encounter (Signed)
Left message for patient to return call for lab results. 

## 2014-04-01 ENCOUNTER — Ambulatory Visit (INDEPENDENT_AMBULATORY_CARE_PROVIDER_SITE_OTHER): Payer: 59 | Admitting: Physician Assistant

## 2014-04-01 ENCOUNTER — Encounter: Payer: Self-pay | Admitting: Physician Assistant

## 2014-04-01 VITALS — BP 110/68 | HR 68 | Temp 98.2°F | Resp 16 | Ht 64.5 in | Wt 153.0 lb

## 2014-04-01 DIAGNOSIS — R059 Cough, unspecified: Secondary | ICD-10-CM

## 2014-04-01 DIAGNOSIS — R05 Cough: Secondary | ICD-10-CM

## 2014-04-01 DIAGNOSIS — M7581 Other shoulder lesions, right shoulder: Secondary | ICD-10-CM

## 2014-04-01 DIAGNOSIS — R030 Elevated blood-pressure reading, without diagnosis of hypertension: Secondary | ICD-10-CM

## 2014-04-01 DIAGNOSIS — E559 Vitamin D deficiency, unspecified: Secondary | ICD-10-CM

## 2014-04-01 DIAGNOSIS — E119 Type 2 diabetes mellitus without complications: Secondary | ICD-10-CM

## 2014-04-01 DIAGNOSIS — Z79899 Other long term (current) drug therapy: Secondary | ICD-10-CM

## 2014-04-01 DIAGNOSIS — M25512 Pain in left shoulder: Secondary | ICD-10-CM

## 2014-04-01 DIAGNOSIS — E782 Mixed hyperlipidemia: Secondary | ICD-10-CM

## 2014-04-01 LAB — CBC WITH DIFFERENTIAL/PLATELET
BASOS ABS: 0 10*3/uL (ref 0.0–0.1)
Basophils Relative: 1 % (ref 0–1)
Eosinophils Absolute: 0.1 10*3/uL (ref 0.0–0.7)
Eosinophils Relative: 2 % (ref 0–5)
HCT: 47.2 % (ref 39.0–52.0)
Hemoglobin: 15.9 g/dL (ref 13.0–17.0)
LYMPHS ABS: 1.5 10*3/uL (ref 0.7–4.0)
LYMPHS PCT: 36 % (ref 12–46)
MCH: 29.8 pg (ref 26.0–34.0)
MCHC: 33.7 g/dL (ref 30.0–36.0)
MCV: 88.6 fL (ref 78.0–100.0)
MONO ABS: 0.6 10*3/uL (ref 0.1–1.0)
MONOS PCT: 13 % — AB (ref 3–12)
MPV: 11 fL (ref 8.6–12.4)
Neutro Abs: 2.1 10*3/uL (ref 1.7–7.7)
Neutrophils Relative %: 48 % (ref 43–77)
PLATELETS: 214 10*3/uL (ref 150–400)
RBC: 5.33 MIL/uL (ref 4.22–5.81)
RDW: 13.9 % (ref 11.5–15.5)
WBC: 4.3 10*3/uL (ref 4.0–10.5)

## 2014-04-01 LAB — HEPATIC FUNCTION PANEL
ALBUMIN: 4.2 g/dL (ref 3.5–5.2)
ALT: 35 U/L (ref 0–53)
AST: 25 U/L (ref 0–37)
Alkaline Phosphatase: 86 U/L (ref 39–117)
Bilirubin, Direct: 0.1 mg/dL (ref 0.0–0.3)
Indirect Bilirubin: 0.3 mg/dL (ref 0.2–1.2)
Total Bilirubin: 0.4 mg/dL (ref 0.2–1.2)
Total Protein: 6.9 g/dL (ref 6.0–8.3)

## 2014-04-01 LAB — BASIC METABOLIC PANEL WITH GFR
BUN: 11 mg/dL (ref 6–23)
CHLORIDE: 103 meq/L (ref 96–112)
CO2: 25 mEq/L (ref 19–32)
CREATININE: 0.85 mg/dL (ref 0.50–1.35)
Calcium: 9.1 mg/dL (ref 8.4–10.5)
GFR, Est Non African American: >89 mL/min
Glucose, Bld: 93 mg/dL (ref 70–99)
Potassium: 4 mEq/L (ref 3.5–5.3)
Sodium: 139 mEq/L (ref 135–145)

## 2014-04-01 LAB — LIPID PANEL
CHOL/HDL RATIO: 4.2 ratio
Cholesterol: 162 mg/dL (ref 0–200)
HDL: 39 mg/dL — AB (ref >=40)
LDL CALC: 77 mg/dL (ref 0–99)
Triglycerides: 230 mg/dL — ABNORMAL HIGH (ref <150)
VLDL: 46 mg/dL — ABNORMAL HIGH (ref 0–40)

## 2014-04-01 LAB — MAGNESIUM: Magnesium: 2.2 mg/dL (ref 1.5–2.5)

## 2014-04-01 LAB — TSH: TSH: 1.9 u[IU]/mL (ref 0.350–4.500)

## 2014-04-01 MED ORDER — MELOXICAM 15 MG PO TABS: ORAL_TABLET | ORAL | Status: DC

## 2014-04-01 MED ORDER — CETIRIZINE HCL 10 MG PO TABS: 10.0000 mg | ORAL_TABLET | Freq: Every day | ORAL | Status: DC

## 2014-04-01 NOTE — Patient Instructions (Signed)
Four common causes of cough:   Allergies, Viral Infections, Acid Reflux and Bacterial Infections. 1) Allergies and viral infections cause a cough by post nasal drip and are often worse at night, can also have sneezing, lower grade fevers, clear/yellow mucus. This is best treated with allergy medications or nasal sprays.  2) Bacterial infections are more severe than allergies or viral infections with fever, teeth pain, fatigue. This can be treated with prednisone and the same over the counter medication and after 7 days an antibiotic.  3) Silent reflux/GERD can cause a cough WITHOUT heart burn because the esophagus that goes to the stomach and trachea that goes to the lungs are very close and when you lay down the acid can irritate your throat and lungs. This can cause hoarseness, cough, and wheezing. Please stop any alcohol or anti-inflammatories like aleve/advil/ibuprofen and start an over the counter Prilosec or omeprazole 1-2 times daily 30mins before food for 2 weeks, then switch to over the counter zantac/ratinidine or pepcid/famotadine once at night for 2 weeks.   4) sometimes irritation causes more irritation. Try voice rest, use sugar free cough drops to prevent coughing, and try to stop clearing your throat.   If you ever have a cough that does not go away after trying these things please make a follow up visit for further evaluation or we can refer you to a specialist. Or if you ever have shortness of breath or chest pain go to the ER.    Diabetes is a very complicated disease...lets simplify it.  An easy way to look at it to understand the complications is if you think of the extra sugar floating in your blood stream as glass shards floating through your blood stream.    Diabetes affects your small vessels first: 1) The glass shards (sugar) scraps down the tiny blood vessels in your eyes and lead to diabetic retinopathy, the leading cause of blindness in the [place]. Diabetes is the leading  cause of newly diagnosed adult (3920 to 47 years of age) blindness in the [place].  2) The glass shards scratches down the tiny vessels of your legs leading to nerve damage called neuropathy and can lead to amputations of your feet. More than 60% of all non-traumatic amputations of lower limbs occur in people with diabetes.  3) Over time the small vessels in your brain are shredded and closed off, individually this does not cause any problems but over a long period of time many of the small vessels being blocked can lead to Vascular Dementia.   4) Your kidney's are a filter system and have a "net" that keeps certain things in the body and lets bad things out. Sugar shreds this net and leads to kidney damage and eventually failure. Decreasing the sugar that is destroying the net and certain blood pressure medications can help stop or decrease progression of kidney disease. Diabetes was the primary cause of kidney failure in 44 percent of all new cases in 2011.  5) Diabetes also destroys the small vessels in your penis that lead to erectile dysfunction. Eventually the vessels are so damaged that you may not be responsive to cialis or viagra.   Diabetes and your large vessels: Your larger vessels consist of your coronary arteries in your heart and the carotid vessels to your brain. Diabetes or even increased sugars put you at 300% increased risk of heart attack and stroke and this is why.. The sugar scrapes down your large blood vessels and your  body sees this as an internal injury and tries to repair itself. Just like you get a scab on your skin, your platelets will stick to the blood vessel wall trying to heal it. This is why we have diabetics on low dose aspirin daily, this prevents the platelets from sticking and can prevent plaque formation. In addition, your body takes cholesterol and tries to shove it into the open wound. This is why we want your LDL, or bad cholesterol, below 70.   The  combination of platelets and cholesterol over 5-10 years forms plaque that can break off and cause a heart attack or stroke.   PLEASE REMEMBER:  Diabetes is preventable! Up to 85 percent of complications and morbidities among individuals with type 2 diabetes can be prevented, delayed, or effectively treated and minimized with regular visits to a health professional, appropriate monitoring and medication, and a healthy diet and lifestyle.     Bad carbs also include fruit juice, alcohol, and sweet tea. These are empty calories that do not signal to your brain that you are full.   Please remember the good carbs are still carbs which convert into sugar. So please measure them out no more than 1/2-1 cup of rice, oatmeal, pasta, and beans  Veggies are however free foods! Pile them on.   Not all fruit is created equal. Please see the list below, the fruit at the bottom is higher in sugars than the fruit at the top. Please avoid all dried fruits.       Marland Kitchen

## 2014-04-01 NOTE — Progress Notes (Signed)
Assessment and Plan:  Hypertension: Continue medication, monitor blood pressure at home. Continue DASH diet.  Reminder to go to the ER if any CP, SOB, nausea, dizziness, severe HA, changes vision/speech, left arm numbness and tingling, and jaw pain. Cholesterol: Continue diet and exercise. Check cholesterol.  diabetes-Continue diet and exercise. Check A1C Vitamin D Def- check level and continue medications.  Left shoulder- will get Xray to look at [facility] joint Right RTC tendonitis- mobic 15 QD, exercises given.  Cough- get on allergy pill, can take delysum  Continue diet and meds as discussed. Further disposition pending results of labs.  HPI 49 y.o. male  presents for 3 month follow up  has a past medical history of Hyperlipidemia; Vitamin D deficiency; and Diabetes mellitus without complication.  His blood pressure has been controlled at home, today their BP is BP: 110/68 mmHg  He does not workout. He denies chest pain, shortness of breath, dizziness.  He is on cholesterol medication and denies myalgias. His cholesterol is at goal. The cholesterol last visit was:   Lab Results  Component Value Date   CHOL 185 12/29/2013   HDL 50 12/29/2013   LDLCALC 111* 12/29/2013   TRIG 122 12/29/2013   CHOLHDL 3.7 12/29/2013    He has been working on diet and exercise for prediabetes, and denies paresthesia of the feet, polydipsia, polyuria and visual disturbances. He is on bASA, he is not on ACE  Last A1C in the office was:  Lab Results  Component Value Date   HGBA1C 6.6* 12/29/2013   Patient is on Vitamin D supplement.   Lab Results  Component Value Date   VD25OH 21* 12/29/2013     He fell into a wall 1-2 months ago into his left shoulder, and he has been having pain at [facility] and both shoulder with he lifts his arm above his head.  Current Medications:  Current Outpatient Prescriptions on File Prior to Visit  Medication Sig Dispense Refill  . lovastatin (MEVACOR) 20 MG tablet Take 1 tablet (20  mg total) by mouth at bedtime. 30 tablet 4   No current facility-administered medications on file prior to visit.   Medical History:  Past Medical History  Diagnosis Date  . Hyperlipidemia   . Vitamin D deficiency   . Diabetes mellitus without complication    Allergies:  Allergies  Allergen Reactions  . Poison Ivy Extract [Extract Of Poison Ivy] Rash    Review of Systems:  Review of Systems  Constitutional: Negative.   HENT: Negative.   Eyes: Negative.   Respiratory: Negative.   Cardiovascular: Negative.   Gastrointestinal: Negative.   Genitourinary: Negative.   Musculoskeletal: Positive for myalgias and joint pain. Negative for back pain, falls and neck pain.  Skin: Negative.   Neurological: Negative.   Endo/Heme/Allergies: Negative.   Psychiatric/Behavioral: Negative.     Family history- Review and unchanged Social history- Review and unchanged Physical Exam: BP 110/68 mmHg  Pulse 68  Temp(Src) 98.2 F (36.8 C)  Resp 16  Ht 5' 4.5" (1.638 m)  Wt 153 lb (69.4 kg)  BMI 25.87 kg/m2 Wt Readings from Last 3 Encounters:  04/01/14 153 lb (69.4 kg)  12/29/13 151 lb (68.493 kg)  10/16/13 156 lb (70.761 kg)   General Appearance: Well nourished, in no apparent distress. Eyes: PERRLA, EOMs, conjunctiva no swelling or erythema Sinuses: No Frontal/maxillary tenderness ENT/Mouth: Ext aud canals clear, TMs without erythema, bulging. No erythema, swelling, or exudate on post pharynx.  Tonsils not swollen or erythematous.  Hearing normal.  Neck: Supple, thyroid normal.  Respiratory: Respiratory effort normal, BS equal bilaterally without rales, rhonchi, wheezing or stridor.  Cardio: RRR with no MRGs. Brisk peripheral pulses without edema.  Abdomen: Soft, + BS,  Non tender, no guarding, rebound, hernias, masses. Lymphatics: Non tender without lymphadenopathy.  Musculoskeletal: Full ROM, 5/5 strength, Normal gait, Left AC tender and prominent compared to right, full ROM Skin:  Warm, dry without rashes, lesions, ecchymosis.  Neuro: Cranial nerves intact. Normal muscle tone, no cerebellar symptoms. Psych: Awake and oriented X 3, normal affect, Insight and Judgment appropriate.    Quentin Mullingollier, Oviya Ammar, PA-C 2:50 PM [facility]

## 2014-04-02 LAB — HEMOGLOBIN A1C
Hgb A1c MFr Bld: 6.4 % — ABNORMAL HIGH (ref <5.7)
MEAN PLASMA GLUCOSE: 137 mg/dL — AB (ref <117)

## 2014-04-02 LAB — VITAMIN D 25 HYDROXY (VIT D DEFICIENCY, FRACTURES): VIT D 25 HYDROXY: 24 ng/mL — AB (ref 30–100)

## 2014-05-25 ENCOUNTER — Other Ambulatory Visit: Payer: Self-pay | Admitting: Emergency Medicine

## 2014-07-19 ENCOUNTER — Ambulatory Visit: Payer: 59 | Admitting: Internal Medicine

## 2014-07-19 NOTE — Progress Notes (Signed)
Patient ID: Victor Mcintyre, male   DOB: 28-Dec-1965, 49 y.o.   MRN: [id number] [name]

## 2014-08-24 ENCOUNTER — Encounter: Payer: Self-pay | Admitting: Internal Medicine

## 2014-08-24 ENCOUNTER — Ambulatory Visit (INDEPENDENT_AMBULATORY_CARE_PROVIDER_SITE_OTHER): Payer: 59 | Admitting: Internal Medicine

## 2014-08-24 VITALS — BP 126/78 | HR 68 | Temp 97.7°F | Resp 16 | Ht 64.5 in | Wt 153.8 lb

## 2014-08-24 DIAGNOSIS — E559 Vitamin D deficiency, unspecified: Secondary | ICD-10-CM

## 2014-08-24 DIAGNOSIS — Z79899 Other long term (current) drug therapy: Secondary | ICD-10-CM

## 2014-08-24 DIAGNOSIS — I10 Essential (primary) hypertension: Secondary | ICD-10-CM

## 2014-08-24 DIAGNOSIS — E119 Type 2 diabetes mellitus without complications: Secondary | ICD-10-CM | POA: Diagnosis not present

## 2014-08-24 DIAGNOSIS — E782 Mixed hyperlipidemia: Secondary | ICD-10-CM | POA: Diagnosis not present

## 2014-08-24 DIAGNOSIS — R0989 Other specified symptoms and signs involving the circulatory and respiratory systems: Secondary | ICD-10-CM | POA: Insufficient documentation

## 2014-08-24 LAB — CBC WITH DIFFERENTIAL/PLATELET
Basophils Absolute: 0.1 10*3/uL (ref 0.0–0.1)
Basophils Relative: 1 % (ref 0–1)
Eosinophils Absolute: 0.1 10*3/uL (ref 0.0–0.7)
Eosinophils Relative: 2 % (ref 0–5)
HCT: 44.8 % (ref 39.0–52.0)
Hemoglobin: 15.8 g/dL (ref 13.0–17.0)
LYMPHS ABS: 1.7 10*3/uL (ref 0.7–4.0)
Lymphocytes Relative: 25 % (ref 12–46)
MCH: 30.9 pg (ref 26.0–34.0)
MCHC: 35.3 g/dL (ref 30.0–36.0)
MCV: 87.5 fL (ref 78.0–100.0)
MONO ABS: 0.5 10*3/uL (ref 0.1–1.0)
MONOS PCT: 7 % (ref 3–12)
MPV: 11.4 fL (ref 8.6–12.4)
Neutro Abs: 4.3 10*3/uL (ref 1.7–7.7)
Neutrophils Relative %: 65 % (ref 43–77)
Platelets: 216 10*3/uL (ref 150–400)
RBC: 5.12 MIL/uL (ref 4.22–5.81)
RDW: 13.9 % (ref 11.5–15.5)
WBC: 6.6 10*3/uL (ref 4.0–10.5)

## 2014-08-24 NOTE — Progress Notes (Signed)
Patient ID: Victor Mcintyre, male   DOB: April 16, 1965, 49 y.o.   MRN: [id number]   This very nice 49 y.o.male presents for 3 month follow up with Hypertension, Hyperlipidemia, T2_NIDDM and Vitamin D Deficiency.    Patient is monitored expectantly for labile HTN & BP has been controlled at home. Today's BP: 126/78 mmHg. Patient has had no complaints of any cardiac type chest pain, palpitations, dyspnea/orthopnea/PND, dizziness, claudication, or dependent edema.   Hyperlipidemia is controlled with diet & meds. Patient denies myalgias or other med SE's. Last Lipids were at goal -  Cholesterol 162; HDL 39*; LDL 77;and elevated  Triglycerides 230 on 04/01/2014.   Also, the patient has history of T2_NIDDM since 2013 nwith A1c's of 6.5% & 6.7% and in 2015 his max A1c was 6.8% and has had no symptoms of reactive hypoglycemia, diabetic polys, paresthesias or visual blurring.  Last A1c was  6.4% on 04/01/2014.    Further, the patient also has history of Vitamin D Deficiency of 28 in 2013 and supplements vitamin D without any suspected side-effects. Last vitamin D was 24 on 04/01/2014.  Medication Sig  . cetirizine (ZYRTEC) 10 MG tablet Take 1 tablet (10 mg total) by mouth at bedtime. For cough/allergies  . lovastatin (MEVACOR) 20 MG tablet Take 1 tablet (20 mg total) by mouth at bedtime.  . lovastatin (MEVACOR) 20 MG tablet TAKE ONE TABLET BY MOUTH ONCE DAILY AT BEDTIME  . meloxicam (MOBIC) 15 MG tablet Take once daily as needed daily with food for pain, do not take aleve/ibuprofen with it, can take tylenol   Allergies  Allergen Reactions  . Poison Ivy Extract [Extract Of Poison Ivy] Rash   PMHx:   Past Medical History  Diagnosis Date  . Hyperlipidemia   . Vitamin D deficiency   . Diabetes mellitus without complication    Immunization History  Administered Date(s) Administered  . Influenza Split 12/29/2013  . PPD Test 12/29/2012, 12/29/2013  . Pneumococcal Polysaccharide-23 12/11/2011  . Tdap  12/11/2011   Past Surgical History  Procedure Laterality Date  . Lithotripsy  2002   FHx:    Reviewed / unchanged  SHx:    Reviewed / unchanged  Systems Review:  Constitutional: Denies fever, chills, wt changes, headaches, insomnia, fatigue, night sweats, change in appetite. Eyes: Denies redness, blurred vision, diplopia, discharge, itchy, watery eyes.  ENT: Denies discharge, congestion, post nasal drip, epistaxis, sore throat, earache, hearing loss, dental pain, tinnitus, vertigo, sinus pain, snoring.  CV: Denies chest pain, palpitations, irregular heartbeat, syncope, dyspnea, diaphoresis, orthopnea, PND, claudication or edema. Respiratory: denies cough, dyspnea, DOE, pleurisy, hoarseness, laryngitis, wheezing.  Gastrointestinal: Denies dysphagia, odynophagia, heartburn, reflux, water brash, abdominal pain or cramps, nausea, vomiting, bloating, diarrhea, constipation, hematemesis, melena, hematochezia  or hemorrhoids. Genitourinary: Denies dysuria, frequency, urgency, nocturia, hesitancy, discharge, hematuria or flank pain. Musculoskeletal: Denies arthralgias, myalgias, stiffness, jt. swelling, pain, limping or strain/sprain.  Skin: Denies pruritus, rash, hives, warts, acne, eczema or change in skin lesion(s). Neuro: No weakness, tremor, incoordination, spasms, paresthesia or pain. Psychiatric: Denies confusion, memory loss or sensory loss. Endo: Denies change in weight, skin or hair change.  Heme/Lymph: No excessive bleeding, bruising or enlarged lymph nodes.  Physical Exam  BP 126/78 mmHg  Pulse 68  Temp(Src) 97.7 F (36.5 C)  Resp 16  Ht 5' 4.5" (1.638 m)  Wt 153 lb 12.8 oz (69.763 kg)  BMI 26.00 kg/m2  Appears well nourished and in no distress. Eyes: PERRLA, EOMs, conjunctiva no swelling or erythema. Sinuses:  No frontal/maxillary tenderness ENT/Mouth: EAC's clear, TM's nl w/o erythema, bulging. Nares clear w/o erythema, swelling, exudates. Oropharynx clear without erythema  or exudates. Oral hygiene is good. Tongue normal, non obstructing. Hearing intact.  Neck: Supple. Thyroid nl. Car 2+/2+ without bruits, nodes or JVD. Chest: Respirations nl with BS clear & equal w/o rales, rhonchi, wheezing or stridor.  Cor: Heart sounds normal w/ regular rate and rhythm without sig. murmurs, gallops, clicks, or rubs. Peripheral pulses normal and equal  without edema.  Abdomen: Soft & bowel sounds normal. Non-tender w/o guarding, rebound, hernias, masses, or organomegaly.  Lymphatics: Unremarkable.  Musculoskeletal: Full ROM all peripheral extremities, joint stability, 5/5 strength, and normal gait.  Skin: Warm, dry without exposed rashes, lesions or ecchymosis apparent.  Neuro: Cranial nerves intact, reflexes equal bilaterally. Sensory-motor testing grossly intact. Tendon reflexes grossly intact.  Pysch: Alert & oriented x 3.  Insight and judgement nl & appropriate. No ideations.  Assessment and Plan:  1. Essential hypertension  - TSH  2. Mixed hyperlipidemia  - Lipid panel  3. T2_NIDDM  - Hemoglobin A1c - Insulin, random  4. Vitamin D deficiency  - Vit D  25 hydroxy   5. Medication management - CBC with Differential/Platelet - BASIC METABOLIC PANEL WITH GFR - Hepatic function panel - Magnesium   Recommended regular exercise, BP monitoring, weight control, and discussed med and SE's. Recommended labs to assess and monitor clinical status. Further disposition pending results of labs. Over 30 minutes of exam, counseling, chart review was performed

## 2014-08-24 NOTE — Patient Instructions (Signed)

## 2014-08-25 LAB — HEPATIC FUNCTION PANEL
ALK PHOS: 70 U/L (ref 40–115)
ALT: 28 U/L (ref 9–46)
AST: 21 U/L (ref 10–40)
Albumin: 4.2 g/dL (ref 3.6–5.1)
BILIRUBIN TOTAL: 0.5 mg/dL (ref 0.2–1.2)
Bilirubin, Direct: 0.1 mg/dL (ref <=0.2)
Indirect Bilirubin: 0.4 mg/dL (ref 0.2–1.2)
TOTAL PROTEIN: 6.6 g/dL (ref 6.1–8.1)

## 2014-08-25 LAB — BASIC METABOLIC PANEL WITH GFR
BUN: 15 mg/dL (ref 7–25)
CO2: 24 mmol/L (ref 20–31)
Calcium: 9.2 mg/dL (ref 8.6–10.3)
Chloride: 104 mmol/L (ref 98–110)
Creat: 0.81 mg/dL (ref 0.60–1.35)
GFR, Est African American: >89 mL/min (ref >=60)
GFR, Est Non African American: >89 mL/min (ref >=60)
Glucose, Bld: 124 mg/dL — ABNORMAL HIGH (ref 65–99)
POTASSIUM: 4 mmol/L (ref 3.5–5.3)
Sodium: 136 mmol/L (ref 135–146)

## 2014-08-25 LAB — LIPID PANEL
CHOLESTEROL: 203 mg/dL — AB (ref 125–200)
HDL: 32 mg/dL — ABNORMAL LOW (ref >=40)
Total CHOL/HDL Ratio: 6.3 Ratio — ABNORMAL HIGH (ref <=5.0)
Triglycerides: 443 mg/dL — ABNORMAL HIGH (ref <150)

## 2014-08-25 LAB — VITAMIN D 25 HYDROXY (VIT D DEFICIENCY, FRACTURES): Vit D, 25-Hydroxy: 23 ng/mL — ABNORMAL LOW (ref 30–100)

## 2014-08-25 LAB — INSULIN, RANDOM: Insulin: 14.5 u[IU]/mL (ref 2.0–19.6)

## 2014-08-25 LAB — MAGNESIUM: Magnesium: 2.1 mg/dL (ref 1.5–2.5)

## 2014-08-25 LAB — HEMOGLOBIN A1C
Hgb A1c MFr Bld: 6.6 % — ABNORMAL HIGH (ref <5.7)
Mean Plasma Glucose: 143 mg/dL — ABNORMAL HIGH (ref <117)

## 2014-08-25 LAB — TSH: TSH: 1.913 u[IU]/mL (ref 0.350–4.500)

## 2015-01-19 ENCOUNTER — Encounter: Payer: Self-pay | Admitting: Internal Medicine

## 2015-01-26 ENCOUNTER — Encounter: Payer: Self-pay | Admitting: Internal Medicine

## 2015-01-26 ENCOUNTER — Ambulatory Visit (INDEPENDENT_AMBULATORY_CARE_PROVIDER_SITE_OTHER): Payer: 59 | Admitting: Internal Medicine

## 2015-01-26 VITALS — BP 130/86 | HR 68 | Temp 97.5°F | Resp 16 | Ht 63.75 in | Wt 153.6 lb

## 2015-01-26 DIAGNOSIS — B070 Plantar wart: Secondary | ICD-10-CM

## 2015-01-26 DIAGNOSIS — Z79899 Other long term (current) drug therapy: Secondary | ICD-10-CM

## 2015-01-26 DIAGNOSIS — Z125 Encounter for screening for malignant neoplasm of prostate: Secondary | ICD-10-CM

## 2015-01-26 DIAGNOSIS — Z1212 Encounter for screening for malignant neoplasm of rectum: Secondary | ICD-10-CM

## 2015-01-26 DIAGNOSIS — R5383 Other fatigue: Secondary | ICD-10-CM

## 2015-01-26 DIAGNOSIS — E782 Mixed hyperlipidemia: Secondary | ICD-10-CM

## 2015-01-26 DIAGNOSIS — E119 Type 2 diabetes mellitus without complications: Secondary | ICD-10-CM

## 2015-01-26 DIAGNOSIS — Z0001 Encounter for general adult medical examination with abnormal findings: Secondary | ICD-10-CM

## 2015-01-26 DIAGNOSIS — R6889 Other general symptoms and signs: Secondary | ICD-10-CM

## 2015-01-26 DIAGNOSIS — Z111 Encounter for screening for respiratory tuberculosis: Secondary | ICD-10-CM | POA: Diagnosis not present

## 2015-01-26 DIAGNOSIS — E559 Vitamin D deficiency, unspecified: Secondary | ICD-10-CM

## 2015-01-26 DIAGNOSIS — I10 Essential (primary) hypertension: Secondary | ICD-10-CM | POA: Diagnosis not present

## 2015-01-26 MED ORDER — ATORVASTATIN CALCIUM 80 MG PO TABS: ORAL_TABLET | ORAL | Status: DC

## 2015-01-26 NOTE — Progress Notes (Signed)
Patient ID: Victor Mcintyre, male   DOB: 11-Apr-1965, 50 y.o.   MRN: [id number]   expectantly for labile HTN & BP has been controlled at home. Today's BP: 126/78 mmHg. Patient has had no complaints of any cardiac type chest pain, palpitations, dyspnea/orthopnea/PND, dizziness, claudication, or dependent edema. Patient also is c/o of a 6 week hx/o pain in his lateral L sole of foot  Hyperlipidemia is controlled with diet & meds. Patient denies myalgias or other med SE's. Last Lipids were at goal - Cholesterol 162; HDL 39*; LDL 77;and elevated Triglycerides 230 on 04/01/2014.  Also, the patient has history of T2_NIDDM since 2013 nwith A1c's of 6.5% & 6.7% and in 2015 his max A1c was 6.8% and has had no symptoms of reactive hypoglycemia, diabetic polys, paresthesias or visual blurring. Last A1c was 6.4% on 04/01/2014.   Further, the patient also has history of Vitamin D Deficiency of 28 in 2013 and supplements vitamin D without any suspected side-effects. Last vitamin D was 24 on 04/01/2014.    Annual  Screening/Preventative Visit And Comprehensive Evaluation & Examination  This very nice 50 y.o. Married [place] male presents for presents for a Wellness/Preventative Visit & comprehensive evaluation and management of multiple medical co-morbidities.  Patient has been followed for labile HTN, T2_NIDDM  , Hyperlipidemia and Vitamin D Deficiency.   Patient has hx/o labile HTN and is monitored expectantly. Patient's BP has been controlled at home.Today's BP: 130/86 mmHg. Patient denies any cardiac symptoms as chest pain, palpitations, shortness of breath, dizziness or ankle swelling.   Patient's hyperlipidemia is not ontrolled with diet and medications. Patient denies myalgias or other medication SE's. Last lipids were not at goal with Cholesterol 203*; HDL 32*; LDL NOT CALC; and elevated Triglycerides 443 on 08/24/2014.   Patient has T2_NIDDM since 2013 with A1c 6.7% and  he's attempting to manage this with diet. Patient denies reactive hypoglycemic symptoms, visual blurring, diabetic polys or paresthesias. Last A1c was 6.6% on 08/24/2014.     Finally, patient has history of Vitamin D Deficiency of 21 in 2015 and 24 in 2016 and last vitamin D was still very low 23 on 08/24/2014.   Medication Sig  . cetirizine (ZYRTEC) 10 MG tablet Take 1 tab at bedtime  . lovastatin (MEVACOR) 20 MG tablet Take 1 tab at bedtime.  . meloxicam (MOBIC) 15 MG tablet Take once daily as needed daily with food for pain   Allergies  Allergen Reactions  . Poison Ivy Extract [Extract Of Poison Ivy] Rash   Past Medical History  Diagnosis Date  . Hyperlipidemia   . Vitamin D deficiency   . Diabetes mellitus without complication [facility])    Health Maintenance  Topic Date Due  . FOOT EXAM  01/24/1977  . OPHTHALMOLOGY EXAM  01/24/1977  . INFLUENZA VACCINE  08/23/2014  . URINE MICROALBUMIN  12/30/2014  . HEMOGLOBIN A1C  02/24/2015  . PNEUMOCOCCAL POLYSACCHARIDE VACCINE (2) 12/10/2016  . TETANUS/TDAP  12/10/2021  . HIV Screening  Completed   Immunization History  Administered Date(s) Administered  . Influenza Split 12/29/2013  . PPD Test 12/29/2012, 12/29/2013, 01/26/2015  . Pneumococcal Polysaccharide-23 12/11/2011  . Tdap 12/11/2011   Past Surgical History  Procedure Laterality Date  . Lithotripsy  2002   Family History  Problem Relation Age of Onset  . Cancer Mother   . Diabetes Father    Social History   Social History  . Marital Status: Single    Spouse Name: N/A  . Number of Children:  N/A  . Years of Education: N/A   Occupational History  . 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 Smoking status: Never Smoker   . Smokeless tobacco: No  . Alcohol Use: Comment: rarely drinks  . Drug Use: No  . Sexual Activity: Active    ROS Constitutional: Denies fever, chills, weight loss/gain, headaches, insomnia,  night sweats or  change in appetite. Does c/o fatigue. Eyes: Denies redness, blurred vision, diplopia, discharge, itchy or watery eyes.  ENT: Denies discharge, congestion, post nasal drip, epistaxis, sore throat, earache, hearing loss, dental pain, Tinnitus, Vertigo, Sinus pain or snoring.  Cardio: Denies chest pain, palpitations, irregular heartbeat, syncope, dyspnea, diaphoresis, orthopnea, PND, claudication or edema Respiratory: denies cough, dyspnea, DOE, pleurisy, hoarseness, laryngitis or wheezing.  Gastrointestinal: Denies dysphagia, heartburn, reflux, water brash, pain, cramps, nausea, vomiting, bloating, diarrhea, constipation, hematemesis, melena, hematochezia, jaundice or hemorrhoids Genitourinary: Denies dysuria, frequency, urgency, nocturia, hesitancy, discharge, hematuria or flank pain Musculoskeletal: Denies arthralgia, myalgia, stiffness, Jt. Swelling, pain, limp or strain/sprain. Denies Falls. Skin: Denies puritis, rash, hives, warts, acne, eczema or change in skin lesion Neuro: No weakness, tremor, incoordination, spasms, paresthesia or pain Psychiatric: Denies confusion, memory loss or sensory loss. Denies Depression. Endocrine: Denies change in weight, skin, hair change, nocturia, and paresthesia, diabetic polys, visual blurring or hyper / hypo glycemic episodes.  Heme/Lymph: No excessive bleeding, bruising or enlarged lymph nodes.  Physical Exam  BP 130/86 mmHg  Pulse 68  Temp(Src) 97.5 F (36.4 C)  Resp 16  Ht 5' 3.75" (1.619 m)  Wt 153 lb 9.6 oz (69.673 kg)  BMI 26.58 kg/m2  General Appearance: Well nourished, in no apparent distress. Eyes: PERRLA, EOMs, conjunctiva no swelling or erythema, normal fundi and vessels. Sinuses: No frontal/maxillary tenderness ENT/Mouth: EACs patent / TMs  nl. Nares clear without erythema, swelling, mucoid exudates. Oral hygiene is good. No erythema, swelling, or exudate. Tongue normal, non-obstructing. Tonsils not swollen or erythematous. Hearing  normal.  Neck: Supple, thyroid normal. No bruits, nodes or JVD. Respiratory: Respiratory effort normal.  BS equal and clear bilateral without rales, rhonci, wheezing or stridor. Cardio: Heart sounds are normal with regular rate and rhythm and no murmurs, rubs or gallops. Peripheral pulses are normal and equal bilaterally without edema. No aortic or femoral bruits. Chest: symmetric with normal excursions and percussion.  Abdomen: Soft, with Nl bowel sounds. Nontender, no guarding, rebound, hernias, masses, or organomegaly.  Lymphatics: Non tender without lymphadenopathy.  Genitourinary: No hernias.Testes nl. DRE - prostate nl for age - smooth & firm w/o nodules. Musculoskeletal: Full ROM all peripheral extremities, joint stability, 5/5 strength, and normal gait. Skin: Warm and dry without rashes, lesions, cyanosis, clubbing or  ecchymosis. There is a tender 10 mm plantar wart of the lateral left foot sole.   Procedure  (CPT: 11301) - After informed consent and aseptic prep w/alcohol and local anesthesia the area was sharply excised with a #10 scalpel and the deeply hyfrecated and then painted with "New-Skin" and then covered with a sterile 2" x #" Tegaderm bandage . Patient was instructed in wound care  Neuro: Cranial nerves intact, reflexes equal bilaterally. Normal muscle tone, no cerebellar symptoms. Sensation intact.  Pysch: Alert and oriented X 3 with normal affect, insight and judgment appropriate.   Assessment and Plan  1. Annual Preventative/Screening Exam   - Microalbumin / creatinine urine ratio - EKG 12-Lead - POC Hemoccult Bld/Stl  - Urinalysis, Routine w reflex microscopic  -  Vitamin B12 - Iron and TIBC - PSA - Testosterone - CBC with Differential/Platelet - BASIC METABOLIC PANEL WITH GFR - Hepatic function panel - Magnesium - Lipid panel - TSH - Hemoglobin A1c - Insulin, random - VITAMIN D 25 Hydroxy   2. Essential hypertension  - EKG 12-Lead - TSH  3. Mixed  hyperlipidemia  - Lipid panel - TSH - atorvastatin (LIPITOR) 80 MG tablet; Take 1/2 to 1 tablet daily or as directed  Dispense: 30 tablet; Refill: 5  -(to start at 1/2 tablet 2 x /week)   4. T2_NIDDM  - Microalbumin / creatinine urine ratio - Hemoglobin A1c - Insulin, random  5. Vitamin D deficiency  - VITAMIN D 25 Hydroxy  6. Screening for rectal cancer  - POC Hemoccult Bld/Stl   7. Prostate cancer screening  - PSA  8. Other fatigue  - Vitamin B12 - Iron and TIBC - Testosterone  9. Plantar wart, left foot  - Excised (Procedure : 11301)   10. Screening examination for pulmonary tuberculosis  - PPD  11. Medication management  - Urinalysis, Routine w reflex microscopic  - CBC with Differential/Platelet - BASIC METABOLIC PANEL WITH GFR - Hepatic function panel - Magnesium   Continue prudent diet as discussed, weight control, BP monitoring, regular exercise, and medications as discussed.  Discussed med effects and SE's. Routine screening labs and tests as requested with regular follow-up as recommended. Over 40 minutes of exam, counseling, chart review and high complex critical decision making was performed

## 2015-01-26 NOTE — Patient Instructions (Addendum)
+++++++++++++++++++++++++++++++++++++++                 New Cholesterol pill  Atorvastatin  (Lipitor ) 80 mg   Take 1/2 tablet 2 x week on Mondays & Thursday   ================================================================ Recommend Adult Low Dose Aspirin or   coated  Aspirin 81 mg daily   To reduce risk of Colon Cancer 20 %,   Skin Cancer 26 % ,   Melanoma 46%   and   Pancreatic cancer 60%   ++++++++++++++++++++++++++++++++++++++++++++++++++++++  Vitamin D goal   is between 70-100.   Please make sure that you are taking your Vitamin D as directed.   It is very important as a natural anti-inflammatory   helping hair, skin, and nails, as well as reducing stroke and heart attack risk.   It helps your bones and helps with mood.  It also decreases numerous cancer risks so please take it as directed.   Low Vit D is associated with a 200-300% higher risk for CANCER   and 200-300% higher risk for HEART   ATTACK  &  STROKE.   .....................................Marland Kitchen  It is also associated with higher death rate at younger ages,   autoimmune diseases like Rheumatoid arthritis, Lupus, Multiple Sclerosis.     Also many other serious conditions, like depression, Alzheimer's  Dementia, infertility, muscle aches, fatigue, fibromyalgia - just to name a few.  ++++++++++++++++++++++++++++++++++++++++++++++++  Recommend the book "The END of DIETING" by Dr Monico Hoar   & the book "The END of DIABETES " by Dr Monico Hoar  At [email or web address] - get book & Audio CD's     Being diabetic has a  300% increased risk for heart attack, stroke, cancer, and alzheimer- type vascular dementia. It is very important that you work harder with diet by avoiding all foods that are white. Avoid white rice (brown & wild rice is OK), white potatoes (sweetpotatoes in moderation is OK), White bread or wheat bread or anything made out of white flour like bagels, donuts, rolls, buns,  biscuits, cakes, pastries, cookies, pizza crust, and pasta (made from white flour & egg whites) - vegetarian pasta or spinach or wheat pasta is OK. Multigrain breads like Arnold's or [employer]idge [employer], or multigrain sandwich thins or flatbreads.  Diet, exercise and weight loss can reverse and cure diabetes in the early stages.  Diet, exercise and weight loss is very important in the control and prevention of complications of diabetes which affects every system in your body, ie. Brain - dementia/stroke, eyes - glaucoma/blindness, heart - heart attack/heart failure, kidneys - dialysis, stomach - gastric paralysis, intestines - malabsorption, nerves - severe painful neuritis, circulation - gangrene & loss of a leg(s), and finally cancer and Alzheimers.    I recommend avoid fried & greasy foods,  sweets/candy, white rice (brown or wild rice or Quinoa is OK), white potatoes (sweet potatoes are OK) - anything made from white flour - bagels, doughnuts, rolls, buns, biscuits,white and wheat breads, pizza crust and traditional pasta made of white flour & egg white(vegetarian pasta or spinach or wheat pasta is OK).  Multi-grain bread is OK - like multi-grain flat bread or sandwich thins. Avoid alcohol in excess. Exercise is also important.    Eat all the vegetables you want - avoid meat, especially red meat and dairy - especially cheese.  Cheese is the most concentrated form of trans-fats which is the worst thing to clog up our arteries. Veggie cheese is OK which can be found in the  fresh produce section at [employer] or [employer] or [employer]  ++++++++++++++++++++++++++++++++++++++++++++++++++ DASH Eating Plan  DASH stands for "Dietary Approaches to Stop Hypertension."   The DASH eating plan is a healthy eating plan that has been shown to reduce high blood pressure (hypertension). Additional health benefits may include reducing the risk of type 2 diabetes mellitus, heart disease, and stroke. The DASH eating  plan may also help with weight loss.  WHAT DO I NEED TO KNOW ABOUT THE DASH EATING PLAN? For the DASH eating plan, you will follow these general guidelines:  Choose foods with a percent daily value for sodium of less than 5% (as listed on the food label).  Use salt-free seasonings or herbs instead of table salt or sea salt.  Check with your health care provider or pharmacist before using salt substitutes.  Eat lower-sodium products, often labeled as "lower sodium" or "no salt added."  Eat fresh foods.  Eat more vegetables, fruits, and low-fat dairy products.    Choose whole grains. Look for the word "whole" as the first word in the ingredient list.  Choose fish   Limit sweets, desserts, sugars, and sugary drinks.  Choose heart-healthy fats.  Eat veggie cheese   Eat more home-cooked food and less restaurant, buffet, and fast food.  Limit fried foods.  Cook foods using methods other than frying.  Limit canned vegetables. If you do use them, rinse them well to decrease the sodium.  When eating at a restaurant, ask that your food be prepared with less salt, or no salt if possible.                      WHAT FOODS CAN I EAT?  Seek help from a dietitian for individual calorie needs.  Grains Whole grain or whole wheat bread. Brown rice. Whole grain or whole wheat pasta. Quinoa, bulgur, and whole grain cereals. Low-sodium cereals. Corn or whole wheat flour tortillas. Whole grain cornbread. Whole grain crackers. Low-sodium crackers.  Vegetables Fresh or frozen vegetables (raw, steamed, roasted, or grilled). Low-sodium or reduced-sodium tomato and vegetable juices. Low-sodium or reduced-sodium tomato sauce and paste. Low-sodium or reduced-sodium canned vegetables.   Fruits All fresh, canned (in natural juice), or frozen fruits.  Protein Products  All fish and seafood.  Dried beans, peas, or lentils. Unsalted nuts and seeds. Unsalted canned beans.  Dairy Low-fat dairy  products, such as skim or 1% milk, 2% or reduced-fat cheeses, low-fat ricotta or cottage cheese, or plain low-fat yogurt. Low-sodium or reduced-sodium cheeses.  Fats and Oils Tub margarines without trans fats. Light or reduced-fat mayonnaise and salad dressings (reduced sodium). Avocado. Safflower, olive, or canola oils. Natural peanut or almond butter.  Other Unsalted popcorn and pretzels. The items listed above may not be a complete list of recommended foods or beverages. Contact your dietitian for more options.  +++++++++++++++++++++++++++++++++++++++++++  WHAT FOODS ARE NOT RECOMMENDED?  Grains/ White flour or wheat flour  White bread. White pasta. White rice. Refined cornbread. Bagels and croissants. Crackers that contain trans fat.  Vegetables  Creamed or fried vegetables. Vegetables in a . Regular canned vegetables. Regular canned tomato sauce and paste. Regular tomato and vegetable juices.  Fruits Dried fruits. Canned fruit in light or heavy syrup. Fruit juice.  Meat and Other Protein Products Meat in general. Fatty cuts of meat. Ribs, chicken wings, bacon, sausage, bologna, salami, chitterlings, fatback, hot dogs, bratwurst, and packaged luncheon meats.  Dairy Whole or 2% milk, cream, half-and-half, and cream cheese.  Whole-fat or sweetened yogurt. Full-fat cheeses or blue cheese. Nondairy creamers and whipped toppings. Processed cheese, cheese spreads, or cheese curds.  Condiments Onion and garlic salt, seasoned salt, table salt, and sea salt. Canned and packaged gravies. Worcestershire sauce. Tartar sauce. Barbecue sauce. Teriyaki sauce. Soy sauce, including reduced sodium. Steak sauce. Fish sauce. Oyster sauce. Cocktail sauce. Horseradish. Ketchup and mustard. Meat flavorings and tenderizers. Bouillon cubes. Hot sauce. Tabasco sauce. Marinades. Taco seasonings. Relishes.  Fats and Oils Butter, stick margarine, lard, shortening, ghee, and bacon fat. Coconut, palm kernel,  or palm oils. Regular salad dressings.  Pickles and olives. Salted popcorn and pretzels. The items listed above may not be a complete list of foods and beverages to avoid.   Preventive Care for Adults  A healthy lifestyle and preventive care can promote health and wellness. Preventive health guidelines for men include the following key practices:  A routine yearly physical is a good way to check with your health care provider about your health and preventative screening. It is a chance to share any concerns and updates on your health and to receive a thorough exam.  Visit your dentist for a routine exam and preventative care every 6 months. Brush your teeth twice a day and floss once a day. Good oral hygiene prevents tooth decay and gum disease.  The frequency of eye exams is based on your age, health, family medical history, use of contact lenses, and other factors. Follow your health care provider's recommendations for frequency of eye exams.  Eat a healthy diet. Foods such as vegetables, fruits, whole grains, low-fat dairy products, and lean protein foods contain the nutrients you need without too many calories. Decrease your intake of foods high in solid fats, added sugars, and salt. Eat the right amount of calories for you.Get information about a proper diet from your health care provider, if necessary.  Regular physical exercise is one of the most important things you can do for your health. Most adults should get at least 150 minutes of moderate-intensity exercise (any activity that increases your heart rate and causes you to sweat) each week. In addition, most adults need muscle-strengthening exercises on 2 or more days a week.  Maintain a healthy weight. The body mass index (BMI) is a screening tool to identify possible weight problems. It provides an estimate of body fat based on height and weight. Your health care provider can find your BMI and can help you achieve or maintain a  healthy weight.For adults 20 years and older:  A BMI below 18.5 is considered underweight.  A BMI of 18.5 to 24.9 is normal.  A BMI of 25 to 29.9 is considered overweight.  A BMI of 30 and above is considered obese.  Maintain normal blood lipids and cholesterol levels by exercising and minimizing your intake of saturated fat. Eat a balanced diet with plenty of fruit and vegetables. Blood tests for lipids and cholesterol should begin at age 13 and be repeated every 5 years. If your lipid or cholesterol levels are high, you are over 50, or you are at high risk for heart disease, you may need your cholesterol levels checked more frequently.Ongoing high lipid and cholesterol levels should be treated with medicines if diet and exercise are not working.  If you smoke, find out from your health care provider how to quit. If you do not use tobacco, do not start.  Lung cancer screening is recommended for adults aged 55-80 years who are at high risk for  developing lung cancer because of a history of smoking. A yearly low-dose CT scan of the lungs is recommended for people who have at least a 30-pack-year history of smoking and are a current smoker or have quit within the past 15 years. A pack year of smoking is smoking an average of 1 pack of cigarettes a day for 1 year (for example: 1 pack a day for 30 years or 2 packs a day for 15 years). Yearly screening should continue until the smoker has stopped smoking for at least 15 years. Yearly screening should be stopped for people who develop a health problem that would prevent them from having lung cancer treatment.  If you choose to drink alcohol, do not have more than 2 drinks per day. One drink is considered to be 12 ounces (355 mL) of beer, 5 ounces (148 mL) of wine, or 1.5 ounces (44 mL) of liquor.  Avoid use of street drugs. Do not share needles with anyone. Ask for help if you need support or instructions about stopping the use of drugs.  High blood  pressure causes heart disease and increases the risk of stroke. Your blood pressure should be checked at least every 1-2 years. Ongoing high blood pressure should be treated with medicines, if weight loss and exercise are not effective.  If you are 67-64 years old, ask your health care provider if you should take aspirin to prevent heart disease.  Diabetes screening involves taking a blood sample to check your fasting blood sugar level. This should be done once every 3 years, after age 51, if you are within normal weight and without risk factors for diabetes. Testing should be considered at a younger age or be carried out more frequently if you are overweight and have at least 1 risk factor for diabetes.  Colorectal cancer can be detected and often prevented. Most routine colorectal cancer screening begins at the age of 42 and continues through age 38. However, your health care provider may recommend screening at an earlier age if you have risk factors for colon cancer. On a yearly basis, your health care provider may provide home test kits to check for hidden blood in the stool. Use of a small camera at the end of a tube to directly examine the colon (sigmoidoscopy or colonoscopy) can detect the earliest forms of colorectal cancer. Talk to your health care provider about this at age 10, when routine screening begins. Direct exam of the colon should be repeated every 5-10 years through age 6, unless early forms of precancerous polyps or small growths are found.   Talk with your health care provider about prostate cancer screening.  Testicular cancer screening isrecommended for adult males. Screening includes self-exam, a health care provider exam, and other screening tests. Consult with your health care provider about any symptoms you have or any concerns you have about testicular cancer.  Use sunscreen. Apply sunscreen liberally and repeatedly throughout the day. You should seek shade when your shadow  is shorter than you. Protect yourself by wearing long sleeves, pants, a wide-brimmed hat, and sunglasses year round, whenever you are outdoors.  Once a month, do a whole-body skin exam, using a mirror to look at the skin on your back. Tell your health care provider about new moles, moles that have irregular borders, moles that are larger than a pencil eraser, or moles that have changed in shape or color.  Stay current with required vaccines (immunizations).  Influenza vaccine. All adults should be  immunized every year.  Tetanus, diphtheria, and acellular pertussis (Td, Tdap) vaccine. An adult who has not previously received Tdap or who does not know his vaccine status should receive 1 dose of Tdap. This initial dose should be followed by tetanus and diphtheria toxoids (Td) booster doses every 10 years. Adults with an unknown or incomplete history of completing a 3-dose immunization series with Td-containing vaccines should begin or complete a primary immunization series including a Tdap dose. Adults should receive a Td booster every 10 years.  Varicella vaccine. An adult without evidence of immunity to varicella should receive 2 doses or a second dose if he has previously received 1 dose.  Human papillomavirus (HPV) vaccine. Males aged 13-21 years who have not received the vaccine previously should receive the 3-dose series. Males aged 22-26 years may be immunized. Immunization is recommended through the age of 98 years for any male who has sex with males and did not get any or all doses earlier. Immunization is recommended for any person with an immunocompromised condition through the age of 26 years if he did not get any or all doses earlier. During the 3-dose series, the second dose should be obtained 4-8 weeks after the first dose. The third dose should be obtained 24 weeks after the first dose and 16 weeks after the second dose.  Zoster vaccine. One dose is recommended for adults aged 31 years or  older unless certain conditions are present.    PREVNAR  - Pneumococcal 13-valent conjugate (PCV13) vaccine. When indicated, a person who is uncertain of his immunization history and has no record of immunization should receive the PCV13 vaccine. An adult aged 68 years or older who has certain medical conditions and has not been previously immunized should receive 1 dose of PCV13 vaccine. This PCV13 should be followed with a dose of pneumococcal polysaccharide (PPSV23) vaccine. The PPSV23 vaccine dose should be obtained at least 8 weeks after the dose of PCV13 vaccine. An adult aged 34 years or older who has certain medical conditions and previously received 1 or more doses of PPSV23 vaccine should receive 1 dose of PCV13. The PCV13 vaccine dose should be obtained 1 or more years after the last PPSV23 vaccine dose.    PNEUMOVAX - Pneumococcal polysaccharide (PPSV23) vaccine. When PCV13 is also indicated, PCV13 should be obtained first. All adults aged 59 years and older should be immunized. An adult younger than age 8 years who has certain medical conditions should be immunized. Any person who resides in a nursing home or long-term care facility should be immunized. An adult smoker should be immunized. People with an immunocompromised condition and certain other conditions should receive both PCV13 and PPSV23 vaccines. People with human immunodeficiency virus (HIV) infection should be immunized as soon as possible after diagnosis. Immunization during chemotherapy or radiation therapy should be avoided. Routine use of PPSV23 vaccine is not recommended for American Indians, [address], or people younger than 65 years unless there are medical conditions that require PPSV23 vaccine. When indicated, people who have unknown immunization and have no record of immunization should receive PPSV23 vaccine. One-time revaccination 5 years after the first dose of PPSV23 is recommended for people aged 19-64 years who  have chronic kidney failure, nephrotic syndrome, asplenia, or immunocompromised conditions. People who received 1-2 doses of PPSV23 before age 64 years should receive another dose of PPSV23 vaccine at age 43 years or later if at least 5 years have passed since the previous dose. Doses of PPSV23  are not needed for people immunized with PPSV23 at or after age 91 years.    Hepatitis A vaccine. Adults who wish to be protected from this disease, have certain high-risk conditions, work with hepatitis A-infected animals, work in hepatitis A research labs, or travel to or work in countries with a high rate of hepatitis A should be immunized. Adults who were previously unvaccinated and who anticipate close contact with an international adoptee during the first 60 days after arrival in the [place] from a country with a high rate of hepatitis A should be immunized.    Hepatitis B vaccine. Adults should be immunized if they wish to be protected from this disease, have certain high-risk conditions, may be exposed to blood or other infectious body fluids, are household contacts or sex partners of hepatitis B positive people, are clients or workers in certain care facilities, or travel to or work in countries with a high rate of hepatitis B.   Preventive Service / Frequency   Ages 86 to 80  Blood pressure check.  Lipid and cholesterol check  Lung cancer screening. / Every year if you are aged 55-80 years and have a 30-pack-year history of smoking and currently smoke or have quit within the past 15 years. Yearly screening is stopped once you have quit smoking for at least 15 years or develop a health problem that would prevent you from having lung cancer treatment.  Fecal occult blood test (FOBT) of stool. / Every year beginning at age 35 and continuing until age 39. You may not have to do this test if you get a colonoscopy every 10 years.  Flexible sigmoidoscopy** or colonoscopy.** / Every 5 years for  a flexible sigmoidoscopy or every 10 years for a colonoscopy beginning at age 47 and continuing until age 60. Screening for abdominal aortic aneurysm (AAA)  by ultrasound is recommended for people who have history of high blood pressure or who are current or former smokers.

## 2015-01-27 LAB — CBC WITH DIFFERENTIAL/PLATELET
Basophils Absolute: 0.1 10*3/uL (ref 0.0–0.1)
Basophils Relative: 1 % (ref 0–1)
EOS ABS: 0.1 10*3/uL (ref 0.0–0.7)
Eosinophils Relative: 1 % (ref 0–5)
HEMATOCRIT: 47 % (ref 39.0–52.0)
HEMOGLOBIN: 16.3 g/dL (ref 13.0–17.0)
LYMPHS ABS: 1.4 10*3/uL (ref 0.7–4.0)
Lymphocytes Relative: 23 % (ref 12–46)
MCH: 30.6 pg (ref 26.0–34.0)
MCHC: 34.7 g/dL (ref 30.0–36.0)
MCV: 88.2 fL (ref 78.0–100.0)
MONOS PCT: 7 % (ref 3–12)
MPV: 11 fL (ref 8.6–12.4)
Monocytes Absolute: 0.4 10*3/uL (ref 0.1–1.0)
Neutro Abs: 4.2 10*3/uL (ref 1.7–7.7)
Neutrophils Relative %: 68 % (ref 43–77)
Platelets: 224 10*3/uL (ref 150–400)
RBC: 5.33 MIL/uL (ref 4.22–5.81)
RDW: 14.4 % (ref 11.5–15.5)
WBC: 6.2 10*3/uL (ref 4.0–10.5)

## 2015-01-27 LAB — URINALYSIS, ROUTINE W REFLEX MICROSCOPIC
Bilirubin Urine: NEGATIVE
Glucose, UA: NEGATIVE
Hgb urine dipstick: NEGATIVE
Ketones, ur: NEGATIVE
LEUKOCYTES UA: NEGATIVE
NITRITE: NEGATIVE
PH: 6 (ref 5.0–8.0)
Protein, ur: NEGATIVE
Specific Gravity, Urine: 1.003 (ref 1.001–1.035)

## 2015-01-27 LAB — HEPATIC FUNCTION PANEL
ALT: 27 U/L (ref 9–46)
AST: 19 U/L (ref 10–40)
Albumin: 4.5 g/dL (ref 3.6–5.1)
Alkaline Phosphatase: 82 U/L (ref 40–115)
BILIRUBIN DIRECT: 0.1 mg/dL (ref <=0.2)
BILIRUBIN INDIRECT: 0.4 mg/dL (ref 0.2–1.2)
BILIRUBIN TOTAL: 0.5 mg/dL (ref 0.2–1.2)
Total Protein: 7.3 g/dL (ref 6.1–8.1)

## 2015-01-27 LAB — MICROALBUMIN / CREATININE URINE RATIO
Creatinine, Urine: 15 mg/dL — ABNORMAL LOW (ref 20–370)
Microalb, Ur: <0.2 mg/dL

## 2015-01-27 LAB — INSULIN, RANDOM: Insulin: 5 u[IU]/mL (ref 2.0–19.6)

## 2015-01-27 LAB — BASIC METABOLIC PANEL WITH GFR
BUN: 11 mg/dL (ref 7–25)
CALCIUM: 9.9 mg/dL (ref 8.6–10.3)
CO2: 25 mmol/L (ref 20–31)
CREATININE: 0.7 mg/dL (ref 0.60–1.35)
Chloride: 103 mmol/L (ref 98–110)
GFR, Est Non African American: >89 mL/min (ref >=60)
GLUCOSE: 83 mg/dL (ref 65–99)
POTASSIUM: 4.5 mmol/L (ref 3.5–5.3)
Sodium: 138 mmol/L (ref 135–146)

## 2015-01-27 LAB — HEMOGLOBIN A1C
HEMOGLOBIN A1C: 6.6 % — AB (ref <5.7)
Mean Plasma Glucose: 143 mg/dL — ABNORMAL HIGH (ref <117)

## 2015-01-27 LAB — LIPID PANEL
CHOL/HDL RATIO: 5.3 ratio — AB (ref <=5.0)
CHOLESTEROL: 202 mg/dL — AB (ref 125–200)
HDL: 38 mg/dL — AB (ref >=40)
LDL Cholesterol: 122 mg/dL (ref <130)
Triglycerides: 209 mg/dL — ABNORMAL HIGH (ref <150)
VLDL: 42 mg/dL — AB (ref <30)

## 2015-01-27 LAB — PSA: PSA: 0.84 ng/mL (ref <=4.00)

## 2015-01-27 LAB — TSH: TSH: 2.005 u[IU]/mL (ref 0.350–4.500)

## 2015-01-27 LAB — MAGNESIUM: Magnesium: 2.1 mg/dL (ref 1.5–2.5)

## 2015-01-27 LAB — IRON AND TIBC
%SAT: 32 % (ref 15–60)
IRON: 101 ug/dL (ref 50–180)
TIBC: 315 ug/dL (ref 250–425)
UIBC: 214 ug/dL (ref 125–400)

## 2015-01-27 LAB — VITAMIN D 25 HYDROXY (VIT D DEFICIENCY, FRACTURES): Vit D, 25-Hydroxy: 35 ng/mL (ref 30–100)

## 2015-01-27 LAB — TESTOSTERONE: Testosterone: 270 ng/dL — ABNORMAL LOW (ref 300–890)

## 2015-01-27 LAB — VITAMIN B12: Vitamin B-12: 644 pg/mL (ref 211–911)

## 2015-01-31 LAB — TB SKIN TEST
Induration: 0 mm
TB Skin Test: NEGATIVE

## 2015-04-27 ENCOUNTER — Encounter: Payer: Self-pay | Admitting: Internal Medicine

## 2015-04-27 ENCOUNTER — Ambulatory Visit (INDEPENDENT_AMBULATORY_CARE_PROVIDER_SITE_OTHER): Payer: 59 | Admitting: Internal Medicine

## 2015-04-27 VITALS — BP 112/64 | HR 78 | Temp 97.8°F | Resp 16 | Ht 63.75 in | Wt 155.0 lb

## 2015-04-27 DIAGNOSIS — Z79899 Other long term (current) drug therapy: Secondary | ICD-10-CM | POA: Diagnosis not present

## 2015-04-27 DIAGNOSIS — I10 Essential (primary) hypertension: Secondary | ICD-10-CM | POA: Diagnosis not present

## 2015-04-27 DIAGNOSIS — E782 Mixed hyperlipidemia: Secondary | ICD-10-CM | POA: Diagnosis not present

## 2015-04-27 DIAGNOSIS — E119 Type 2 diabetes mellitus without complications: Secondary | ICD-10-CM | POA: Diagnosis not present

## 2015-04-27 DIAGNOSIS — B070 Plantar wart: Secondary | ICD-10-CM

## 2015-04-27 DIAGNOSIS — E559 Vitamin D deficiency, unspecified: Secondary | ICD-10-CM

## 2015-04-27 LAB — CBC WITH DIFFERENTIAL/PLATELET
BASOS ABS: 0 {cells}/uL (ref 0–200)
Basophils Relative: 0 %
EOS ABS: 156 {cells}/uL (ref 15–500)
EOS PCT: 2 %
HEMATOCRIT: 47.3 % (ref 38.5–50.0)
HEMOGLOBIN: 16.2 g/dL (ref 13.2–17.1)
LYMPHS ABS: 2418 {cells}/uL (ref 850–3900)
Lymphocytes Relative: 31 %
MCH: 30.5 pg (ref 27.0–33.0)
MCHC: 34.2 g/dL (ref 32.0–36.0)
MCV: 89.1 fL (ref 80.0–100.0)
MONO ABS: 546 {cells}/uL (ref 200–950)
MPV: 11.7 fL (ref 7.5–12.5)
Monocytes Relative: 7 %
NEUTROS ABS: 4680 {cells}/uL (ref 1500–7800)
NEUTROS PCT: 60 %
Platelets: 226 10*3/uL (ref 140–400)
RBC: 5.31 MIL/uL (ref 4.20–5.80)
RDW: 13.8 % (ref 11.0–15.0)
WBC: 7.8 10*3/uL (ref 3.8–10.8)

## 2015-04-27 NOTE — Patient Instructions (Signed)
Please get Dr. Luiz Ochoa salicylic acid.  It generally comes in a bottle that you can paint on the spot on the bottom of your feet.  Use it daily for the next two weeks.  Put a bandaid on it after the medication dries.  Verrugas plantares (Plantar Warts) Las verrugas son pequeos crecimientos en la piel. Pueden aparecer en diferentes zonas del cuerpo. Cuando se forman en la planta del pie, reciben el nombre de verrugas plantares. A menudo, las verrugas plantares aparecen en grupos, con varias verrugas pequeas alrededor de un crecimiento ms grande. Suelen formarse [employer] reas donde hay presin, como el taln o la zona metatarsiana del pie. La mayora de las verrugas no son dolorosas y no suelen Museum/gallery exhibitions officer. Sin embargo, las verrugas plantares pueden ser dolorosas al caminar, porque se las comprime. Con el tiempo, las verrugas suelen desaparecer por s solas. Si es necesario, se pueden Academic librarian tratamientos. A veces, las verrugas desaparecen y luego vuelven a Research officer, trade union. CAUSAS Un tipo de virus llamado virus del Geneticist, molecular (VPH) causa las verrugas plantares. El VPH ataca una lesin en la piel del pie. Caminar descalzo puede llevar a la exposicin al virus. Estas verrugas pueden diseminarse a otras zonas de la planta del pie. Se extienden a otras regiones del cuerpo solo a travs del Chemical engineer. FACTORES DE RIESGO Es ms probable que las verrugas plantares aparezcan en las siguientes personas:  Las que tienen entre 10 y 20aos.  Las que usan duchas pblicas o vestuarios.  Las personas cuyo sistema de defensa del organismo (sistema inmunitario) est debilitado. SNTOMAS Las verrugas plantares pueden ser planas o ligeramente elevadas. Pueden crecer en las capas ms profundas de la piel o sobre la superficie de [place]. La mayora de las verrugas plantares tiene la superficie spera. Pueden causar dolor cuando se apoya el peso del cuerpo [employer]. DIAGNSTICO Generalmente,  una verruga plantar se diagnostica por su aspecto. En algunos casos, se puede extraer Lauris Poag de tejido (biopsia) para analizarla con un microscopio. TRATAMIENTO En muchos casos, las verrugas no requieren TEFL teacher. Sin tratamiento, suelen desaparecer despus de transcurridos muchos meses o un par [employer]. Si es Publishing rights manager, las opciones pueden incluir lo siguiente:  Psychologist, forensic, cremas o parches en la verruga. Pueden ser medicamentos recetados o de venta libre que suavizan la piel de modo que las capas se descamen gradualmente. En muchos casos, el medicamento se aplica una o dos veces por da y se cubre con un apsito.  Colocar cinta adhesiva aislante sobre la verruga (oclusin). Se dejar colocada la cinta adhesiva durante el tiempo que le haya indicado el mdico; luego, la reemplazar por una nueva. Esto se realiza hasta que la verruga desaparece.  Congelar la verruga con nitrgeno lquido (crioterapia).  Quemar la verruga con lo siguiente:  Tratamiento con lser.  Una sonda con corriente elctrica (electrocauterizacin).  La inyeccin de Nature conservation officer (antgeno de cndida) en la verruga para ayudar al sistema inmunitario del organismo a combatirla.  Ciruga para extirpar la verruga. INSTRUCCIONES PARA EL CUIDADO EN EL HOGAR  Aplique las cremas o las soluciones medicinales como se lo haya indicado el mdico. Esto puede implicar lo siguiente:  Sumerja la zona afectada en agua tibia.  Retire la capa superior de piel blanda antes de [employer]. La piedra pmez es una buena opcin para eliminar el tejido.  Coloque un apsito sobre la zona afectada despus de Surveyor, quantity.  Repita el proceso diariamente o  como se lo haya indicado el mdico.  No se rasque ni se toque una verruga.  Lvese las manos despus de tocar una verruga.  Si la verruga es dolorosa, haga la prueba de Journalist, newspapercolocar sobre esta un apsito con un  orificio en [place] medio. Esto ayuda a Company secretarydescomprimir la verruga.  Concurra a todas las visitas de control como se lo haya indicado el mdico. Esto es importante. PREVENCIN Tome estas medidas para evitar las verrugas:  Use zapatos y Economistmedias. Southern CompanyCmbiese todos los das los calcetines.  Mantenga los pies limpios y secos.  Contrlese los pies con frecuencia.  Evite el contacto directo con las verrugas de Economistotras personas. SOLICITE ATENCIN MDICA SI:  Las verrugas no mejoran despus del Bell Canyontratamiento.  Tiene enrojecimiento, hinchazn o dolor en la zona de una verruga.  La verruga le sangra, y el sangrado no se detiene cuando ejerce una presin leve sobre la verruga.  Es diabtico y Company secretaryle sale una verruga.   Esta informacin no tiene Theme park managercomo fin reemplazar el consejo del mdico. Asegrese de hacerle al mdico cualquier pregunta que tenga.   Document Released: 01/08/2005 Document Revised: 09/29/2014 Elsevier Interactive Patient Education [employer]2016 Elsevier Inc.

## 2015-04-27 NOTE — Progress Notes (Signed)
Assessment and Plan:  Hypertension:  -Continue medication,  -monitor blood pressure at home.  -Continue DASH diet.   -Reminder to go to the ER if any CP, SOB, nausea, dizziness, severe HA, changes vision/speech, left arm numbness and tingling, and jaw pain.  Cholesterol: -Continue diet and exercise.  -Check cholesterol.   Pre-diabetes: -Continue diet and exercise.  -Check A1C  Vitamin D Def: -check level -continue medications.   Plantar warts -salicylic acid pain first then get it cut out  Continue diet and meds as discussed. Further disposition pending results of labs.  HPI 50 y.o. male  presents for 3 month follow up with hypertension, hyperlipidemia, prediabetes and vitamin D.   His blood pressure has been controlled at home, today their BP is BP: 112/64 mmHg.   He does not workout. He denies chest pain, shortness of breath, dizziness.   He is on cholesterol medication and denies myalgias. His cholesterol is not at goal. The cholesterol last visit was:   Lab Results  Component Value Date   CHOL 202* 01/26/2015   HDL 38* 01/26/2015   LDLCALC 122 01/26/2015   TRIG 209* 01/26/2015   CHOLHDL 5.3* 01/26/2015     He has been working on diet and exercise for prediabetes, and denies foot ulcerations, hyperglycemia, hypoglycemia , increased appetite, nausea, paresthesia of the feet, polydipsia, polyuria, visual disturbances, vomiting and weight loss. Last A1C in the office was:  Lab Results  Component Value Date   HGBA1C 6.6* 01/26/2015    Patient is on Vitamin D supplement.  Lab Results  Component Value Date   VD25OH 35 01/26/2015     He does not that for the past couple weeks he has had some warts pop up on the bottom of his feet.  He has had these for a few weeks and they are very painful.  He has been using a corn wash on.   Current Medications:  Current Outpatient Prescriptions on File Prior to Visit  Medication Sig Dispense Refill  . atorvastatin (LIPITOR) 80 MG  tablet Take 1/2 to 1 tablet daily or as directed 30 tablet 5  . cetirizine (ZYRTEC) 10 MG tablet Take 1 tablet (10 mg total) by mouth at bedtime. For cough/allergies 30 tablet 4  . meloxicam (MOBIC) 15 MG tablet Take once daily as needed daily with food for pain, do not take aleve/ibuprofen with it, can take tylenol 90 tablet 0   No current facility-administered medications on file prior to visit.    Medical History:  Past Medical History  Diagnosis Date  . Hyperlipidemia   . Vitamin D deficiency   . Diabetes mellitus without complication (HCC)     Allergies:  Allergies  Allergen Reactions  . Poison Ivy Extract [Extract Of Poison Ivy] Rash     Review of Systems:  Review of Systems  Constitutional: Negative for fever, weight loss and malaise/fatigue.  HENT: Negative for congestion, ear pain and sore throat.   Eyes: Negative.   Respiratory: Negative for cough, shortness of breath and wheezing.   Cardiovascular: Negative for chest pain, palpitations and leg swelling.  Gastrointestinal: Negative for heartburn, abdominal pain, diarrhea, constipation, blood in stool and melena.  Genitourinary: Negative.   Skin: Negative.   Neurological: Negative for dizziness, sensory change, loss of consciousness and headaches.  Psychiatric/Behavioral: Negative for depression. The patient is not nervous/anxious and does not have insomnia.     Family history- Review and unchanged  Social history- Review and unchanged  Physical Exam: BP 112/64 mmHg  Pulse 78  Temp(Src) 97.8 F (36.6 C) (Temporal)  Resp 16  Ht 5' 3.75" (1.619 m)  Wt 155 lb (70.308 kg)  BMI 26.82 kg/m2 Wt Readings from Last 3 Encounters:  04/27/15 155 lb (70.308 kg)  01/26/15 153 lb 9.6 oz (69.673 kg)  08/24/14 153 lb 12.8 oz (69.763 kg)    General Appearance: Well nourished well developed, in no apparent distress. Eyes: PERRLA, EOMs, conjunctiva no swelling or erythema ENT/Mouth: Ear canals normal without obstruction,  swelling, erythma, discharge.  TMs normal bilaterally.  Oropharynx moist, clear, without exudate, or postoropharyngeal swelling. Neck: Supple, thyroid normal,no cervical adenopathy  Respiratory: Respiratory effort normal, Breath sounds clear A&P without rhonchi, wheeze, or rale.  No retractions, no accessory usage. Cardio: RRR with no MRGs. Brisk peripheral pulses without edema.  Abdomen: Soft, + BS,  Non tender, no guarding, rebound, hernias, masses. Musculoskeletal: Full ROM, 5/5 strength, Normal gait Skin: Warm, dry without rashes, lesions, ecchymosis. Plantar warts to the bottom of the left foot Neuro: Awake and oriented X 3, Cranial nerves intact. Normal muscle tone, no cerebellar symptoms. Psych: Normal affect, Insight and Judgment appropriate.    [name], PA-C 4:00 PM [facility]

## 2015-04-28 LAB — HEPATIC FUNCTION PANEL
ALT: 35 U/L (ref 9–46)
AST: 19 U/L (ref 10–40)
Albumin: 4.5 g/dL (ref 3.6–5.1)
Alkaline Phosphatase: 73 U/L (ref 40–115)
BILIRUBIN DIRECT: 0.1 mg/dL (ref <=0.2)
BILIRUBIN INDIRECT: 0.3 mg/dL (ref 0.2–1.2)
TOTAL PROTEIN: 7.1 g/dL (ref 6.1–8.1)
Total Bilirubin: 0.4 mg/dL (ref 0.2–1.2)

## 2015-04-28 LAB — BASIC METABOLIC PANEL WITH GFR
BUN: 12 mg/dL (ref 7–25)
CALCIUM: 9.6 mg/dL (ref 8.6–10.3)
CHLORIDE: 100 mmol/L (ref 98–110)
CO2: 24 mmol/L (ref 20–31)
CREATININE: 0.98 mg/dL (ref 0.60–1.35)
GFR, Est African American: >89 mL/min (ref >=60)
GFR, Est Non African American: >89 mL/min (ref >=60)
GLUCOSE: 200 mg/dL — AB (ref 65–99)
Potassium: 4.4 mmol/L (ref 3.5–5.3)
SODIUM: 136 mmol/L (ref 135–146)

## 2015-04-28 LAB — LIPID PANEL
CHOLESTEROL: 166 mg/dL (ref 125–200)
HDL: 39 mg/dL — ABNORMAL LOW (ref >=40)
LDL Cholesterol: 66 mg/dL (ref <130)
Total CHOL/HDL Ratio: 4.3 Ratio (ref <=5.0)
Triglycerides: 304 mg/dL — ABNORMAL HIGH (ref <150)
VLDL: 61 mg/dL — AB (ref <30)

## 2015-04-28 LAB — TSH: TSH: 1.92 mIU/L (ref 0.40–4.50)

## 2015-04-28 LAB — HEMOGLOBIN A1C
Hgb A1c MFr Bld: 7.9 % — ABNORMAL HIGH (ref <5.7)
Mean Plasma Glucose: 180 mg/dL

## 2015-04-29 ENCOUNTER — Other Ambulatory Visit: Payer: Self-pay | Admitting: Internal Medicine

## 2015-04-29 MED ORDER — METFORMIN HCL ER 500 MG PO TB24: ORAL_TABLET | ORAL | Status: DC

## 2015-08-02 ENCOUNTER — Ambulatory Visit: Payer: Self-pay | Admitting: Internal Medicine

## 2015-08-11 ENCOUNTER — Encounter: Payer: Self-pay | Admitting: Internal Medicine

## 2015-08-11 ENCOUNTER — Other Ambulatory Visit: Payer: Self-pay | Admitting: *Deleted

## 2015-08-11 ENCOUNTER — Ambulatory Visit (INDEPENDENT_AMBULATORY_CARE_PROVIDER_SITE_OTHER): Payer: 59 | Admitting: Internal Medicine

## 2015-08-11 VITALS — BP 116/82 | HR 64 | Temp 97.0°F | Resp 16 | Ht 64.0 in | Wt 154.2 lb

## 2015-08-11 DIAGNOSIS — E782 Mixed hyperlipidemia: Secondary | ICD-10-CM

## 2015-08-11 DIAGNOSIS — E119 Type 2 diabetes mellitus without complications: Secondary | ICD-10-CM

## 2015-08-11 DIAGNOSIS — Z79899 Other long term (current) drug therapy: Secondary | ICD-10-CM

## 2015-08-11 DIAGNOSIS — I10 Essential (primary) hypertension: Secondary | ICD-10-CM | POA: Diagnosis not present

## 2015-08-11 DIAGNOSIS — E559 Vitamin D deficiency, unspecified: Secondary | ICD-10-CM | POA: Diagnosis not present

## 2015-08-11 LAB — CBC WITH DIFFERENTIAL/PLATELET
BASOS ABS: 55 {cells}/uL (ref 0–200)
Basophils Relative: 1 %
EOS ABS: 55 {cells}/uL (ref 15–500)
Eosinophils Relative: 1 %
HCT: 47.7 % (ref 38.5–50.0)
HEMOGLOBIN: 16 g/dL (ref 13.2–17.1)
LYMPHS ABS: 1375 {cells}/uL (ref 850–3900)
Lymphocytes Relative: 25 %
MCH: 30.2 pg (ref 27.0–33.0)
MCHC: 33.5 g/dL (ref 32.0–36.0)
MCV: 90 fL (ref 80.0–100.0)
MONO ABS: 440 {cells}/uL (ref 200–950)
MPV: 11.5 fL (ref 7.5–12.5)
Monocytes Relative: 8 %
NEUTROS ABS: 3575 {cells}/uL (ref 1500–7800)
Neutrophils Relative %: 65 %
Platelets: 207 10*3/uL (ref 140–400)
RBC: 5.3 MIL/uL (ref 4.20–5.80)
RDW: 13.8 % (ref 11.0–15.0)
WBC: 5.5 10*3/uL (ref 3.8–10.8)

## 2015-08-11 LAB — BASIC METABOLIC PANEL WITH GFR
BUN: 11 mg/dL (ref 7–25)
CHLORIDE: 101 mmol/L (ref 98–110)
CO2: 24 mmol/L (ref 20–31)
CREATININE: 0.82 mg/dL (ref 0.60–1.35)
Calcium: 9.2 mg/dL (ref 8.6–10.3)
GFR, Est African American: >89 mL/min (ref >=60)
GFR, Est Non African American: >89 mL/min (ref >=60)
Glucose, Bld: 223 mg/dL — ABNORMAL HIGH (ref 65–99)
Potassium: 4 mmol/L (ref 3.5–5.3)
Sodium: 137 mmol/L (ref 135–146)

## 2015-08-11 LAB — HEPATIC FUNCTION PANEL
ALK PHOS: 76 U/L (ref 40–115)
ALT: 29 U/L (ref 9–46)
AST: 18 U/L (ref 10–40)
Albumin: 4.2 g/dL (ref 3.6–5.1)
BILIRUBIN DIRECT: 0.2 mg/dL (ref <=0.2)
Indirect Bilirubin: 0.6 mg/dL (ref 0.2–1.2)
Total Bilirubin: 0.8 mg/dL (ref 0.2–1.2)
Total Protein: 6.8 g/dL (ref 6.1–8.1)

## 2015-08-11 LAB — LIPID PANEL
CHOL/HDL RATIO: 3.5 ratio (ref <=5.0)
CHOLESTEROL: 152 mg/dL (ref 125–200)
HDL: 43 mg/dL (ref >=40)
LDL Cholesterol: 79 mg/dL (ref <130)
TRIGLYCERIDES: 152 mg/dL — AB (ref <150)
VLDL: 30 mg/dL (ref <30)

## 2015-08-11 LAB — TSH: TSH: 1.36 mIU/L (ref 0.40–4.50)

## 2015-08-11 LAB — MAGNESIUM: Magnesium: 2 mg/dL (ref 1.5–2.5)

## 2015-08-11 MED ORDER — ATORVASTATIN CALCIUM 80 MG PO TABS: ORAL_TABLET | ORAL | Status: DC

## 2015-08-11 NOTE — Patient Instructions (Signed)

## 2015-08-11 NOTE — Progress Notes (Signed)
Patient ID: Victor Mcintyre, male   DOB: 11/07/1965, 50 y.o.   MRN: [id number]  [facility]                       Lucky CowboyWilliam Nastassia Bazaldua, M.D.        Dyanne CarrelAmanda R. Steffanie Dunnollier, P.A.-C       Terri Piedraourtney Forcucci, P.A.-C   [facility]                [address]                [place], [place] [id number] Telephone [phone] Telefax [phone] ______________________________________________________________________     This very nice 50 y.o. M [name] presents for 6  month follow up with Hypertension, Hyperlipidemia, Pre-Diabetes and Vitamin D Deficiency.      Patient is monitored expectantly for labile HTN & BP has been controlled at home. Today's BP is 116/82 mmHg. Patient has had no complaints of any cardiac type chest pain, palpitations, dyspnea/orthopnea/PND, dizziness, claudication, or dependent edema.     Hyperlipidemia is controlled with diet & meds. Patient denies myalgias or other med SE's. Last Lipids were at goal with  Cholesterol 166; HDL 39*; LDL 66; Triglycerides 304 on 04/27/2015.     Also, the patient has history of T2_NIDDM since 2013 and has had no symptoms of reactive hypoglycemia, diabetic polys, paresthesias or visual blurring. He is not monitoring his CBG's.  Last A1c was not at goal with A1c 7.9% on 04/27/2015.      Further, the patient also has history of Vitamin D Deficiency of "28" in 2013 and does not supplement as recommended in the past.. Last vitamin D was still very low at 35 on 01/26/2015.   Medication Sig  . Cholecalciferol (VITAMIN D3) 5000 units TABS Take 5,000 Units by mouth daily.  . meloxicam (MOBIC) 15 MG tablet Take once daily as needed daily with food for pain, do not take aleve/ibuprofen with it, can take tylenol  . metFORMIN (GLUCOPHAGE XR) 500 MG 24 hr tablet Take 2 tablets twice daily with meals  . atorvastatin (LIPITOR) 80 MG tablet Take 1/2 to 1 tablet daily or as directed  . cetirizine (ZYRTEC)  10 MG tablet Take 1 tablet (10 mg total) by mouth at bedtime. For cough/allergies   Allergies  Allergen Reactions  . Poison Ivy Extract [Extract Of Poison Ivy] Rash   PMHx:   Past Medical History  Diagnosis Date  . Hyperlipidemia   . Vitamin D deficiency   . Diabetes mellitus without complication (HCC)    Immunization History  Administered Date(s) Administered  . Influenza Split 12/29/2013  . PPD Test 12/29/2012, 12/29/2013, 01/26/2015  . Pneumococcal Polysaccharide-23 12/11/2011  . Tdap 12/11/2011   Past Surgical History  Procedure Laterality Date  . Lithotripsy  2002   FHx:    Reviewed / unchanged  SHx:    Reviewed / unchanged  Systems Review:  Constitutional: Denies fever, chills, wt changes, headaches, insomnia, fatigue, night sweats, change in appetite. Eyes: Denies redness, blurred vision, diplopia, discharge, itchy, watery eyes.  ENT: Denies discharge, congestion, post nasal drip, epistaxis, sore throat, earache, hearing loss, dental pain, tinnitus, vertigo, sinus pain, snoring.  CV: Denies chest pain, palpitations, irregular heartbeat, syncope, dyspnea, diaphoresis, orthopnea, PND, claudication or edema. Respiratory: denies cough, dyspnea, DOE, pleurisy, hoarseness, laryngitis, wheezing.  Gastrointestinal: Denies dysphagia, odynophagia, heartburn, reflux, water brash, abdominal pain or cramps, nausea, vomiting, bloating, diarrhea, constipation, hematemesis,  melena, hematochezia  or hemorrhoids. Genitourinary: Denies dysuria, frequency, urgency, nocturia, hesitancy, discharge, hematuria or flank pain. Musculoskeletal: Denies arthralgias, myalgias, stiffness, jt. swelling, pain, limping or strain/sprain.  Skin: Denies pruritus, rash, hives, warts, acne, eczema or change in skin lesion(s). Neuro: No weakness, tremor, incoordination, spasms, paresthesia or pain. Psychiatric: Denies confusion, memory loss or sensory loss. Endo: Denies change in weight, skin or hair change.   Heme/Lymph: No excessive bleeding, bruising or enlarged lymph nodes.  Physical Exam  BP 116/82 mmHg  Pulse 64  Temp(Src) 97 F (36.1 C)  Resp 16  Ht  (1.626 m)  Wt 154 lb 3.2 oz (69.945 kg)  BMI 26.46 kg/m2  Appears well nourished and in no distress. Eyes: PERRLA, EOMs, conjunctiva no swelling or erythema. Sinuses: No frontal/maxillary tenderness ENT/Mouth: EAC's clear, TM's nl w/o erythema, bulging. Nares clear w/o erythema, swelling, exudates. Oropharynx clear without erythema or exudates. Oral hygiene is good. Tongue normal, non obstructing. Hearing intact.  Neck: Supple. Thyroid nl. Car 2+/2+ without bruits, nodes or JVD. Chest: Respirations nl with BS clear & equal w/o rales, rhonchi, wheezing or stridor.  Cor: Heart sounds normal w/ regular rate and rhythm without sig. murmurs, gallops, clicks, or rubs. Peripheral pulses normal and equal  without edema.  Abdomen: Soft & bowel sounds normal. Non-tender w/o guarding, rebound, hernias, masses, or organomegaly.  Lymphatics: Unremarkable.  Musculoskeletal: Full ROM all peripheral extremities, joint stability, 5/5 strength, and normal gait.  Skin: Warm, dry without exposed rashes, lesions or ecchymosis apparent.  Neuro: Cranial nerves intact, reflexes equal bilaterally. Sensory-motor testing grossly intact. Tendon reflexes grossly intact.  Pysch: Alert & oriented x 3.  Insight and judgement nl & appropriate. No ideations.  Assessment and Plan:   1. Essential hypertension  - Continue medication, monitor blood pressure at home. Continue DASH diet. Reminder to go to the ER if any CP, SOB, nausea, dizziness, severe HA, changes vision/speech, left arm numbness and tingling and jaw pain. - TSH  2. Mixed hyperlipidemia  - Continue diet/meds, exercise,& lifestyle modifications. Continue monitor periodic cholesterol/liver & renal functions  - Lipid panel - TSH  3. T2_NIDDM  - Encouraged to contact hids Ins company re: a  Corporate treasurer or purchase one at [employer].  - [employer], exercise, lifestyle modifications. Monitor appropriate labs. - Hemoglobin A1c - Insulin, random  4. Vitamin D deficiency  - Continue supplementation. - VITAMIN D 25 Hydroxy   5. Medication management  - CBC with Differential/Platelet - BASIC METABOLIC PANEL WITH GFR - Hepatic function panel - Magnesium   Recommended regular exercise, BP monitoring, weight control, and discussed med and SE's. Recommended labs to assess and monitor clinical status. Further disposition pending results of labs. Over 30 minutes of exam, counseling, chart review was performed

## 2015-08-12 ENCOUNTER — Other Ambulatory Visit: Payer: Self-pay | Admitting: Internal Medicine

## 2015-08-12 DIAGNOSIS — E119 Type 2 diabetes mellitus without complications: Secondary | ICD-10-CM

## 2015-08-12 LAB — INSULIN, RANDOM: Insulin: 16.2 u[IU]/mL (ref 2.0–19.6)

## 2015-08-12 LAB — HEMOGLOBIN A1C
HEMOGLOBIN A1C: 8.2 % — AB (ref <5.7)
Mean Plasma Glucose: 189 mg/dL

## 2015-08-12 LAB — VITAMIN D 25 HYDROXY (VIT D DEFICIENCY, FRACTURES): VIT D 25 HYDROXY: 40 ng/mL (ref 30–100)

## 2015-08-12 MED ORDER — GLIPIZIDE 5 MG PO TABS: ORAL_TABLET | ORAL | Status: DC

## 2015-11-11 ENCOUNTER — Encounter: Payer: Self-pay | Admitting: Physician Assistant

## 2015-11-11 ENCOUNTER — Ambulatory Visit (INDEPENDENT_AMBULATORY_CARE_PROVIDER_SITE_OTHER): Payer: 59 | Admitting: Physician Assistant

## 2015-11-11 VITALS — BP 118/68 | HR 86 | Temp 97.7°F | Resp 16 | Ht 64.0 in | Wt 162.4 lb

## 2015-11-11 DIAGNOSIS — Z79899 Other long term (current) drug therapy: Secondary | ICD-10-CM

## 2015-11-11 DIAGNOSIS — E559 Vitamin D deficiency, unspecified: Secondary | ICD-10-CM

## 2015-11-11 DIAGNOSIS — Z23 Encounter for immunization: Secondary | ICD-10-CM | POA: Diagnosis not present

## 2015-11-11 DIAGNOSIS — E782 Mixed hyperlipidemia: Secondary | ICD-10-CM

## 2015-11-11 DIAGNOSIS — I10 Essential (primary) hypertension: Secondary | ICD-10-CM | POA: Diagnosis not present

## 2015-11-11 DIAGNOSIS — E119 Type 2 diabetes mellitus without complications: Secondary | ICD-10-CM | POA: Diagnosis not present

## 2015-11-11 LAB — LIPID PANEL
Cholesterol: 179 mg/dL (ref 125–200)
HDL: 45 mg/dL (ref >=40)
LDL CALC: 102 mg/dL (ref <130)
TRIGLYCERIDES: 162 mg/dL — AB (ref <150)
Total CHOL/HDL Ratio: 4 Ratio (ref <=5.0)
VLDL: 32 mg/dL — AB (ref <30)

## 2015-11-11 LAB — CBC WITH DIFFERENTIAL/PLATELET
BASOS PCT: 1 %
Basophils Absolute: 63 cells/uL (ref 0–200)
EOS PCT: 2 %
Eosinophils Absolute: 126 cells/uL (ref 15–500)
HEMATOCRIT: 49.2 % (ref 38.5–50.0)
HEMOGLOBIN: 16.8 g/dL (ref 13.2–17.1)
LYMPHS ABS: 2079 {cells}/uL (ref 850–3900)
Lymphocytes Relative: 33 %
MCH: 30.7 pg (ref 27.0–33.0)
MCHC: 34.1 g/dL (ref 32.0–36.0)
MCV: 89.8 fL (ref 80.0–100.0)
MONO ABS: 441 {cells}/uL (ref 200–950)
MPV: 12 fL (ref 7.5–12.5)
Monocytes Relative: 7 %
NEUTROS ABS: 3591 {cells}/uL (ref 1500–7800)
Neutrophils Relative %: 57 %
Platelets: 215 10*3/uL (ref 140–400)
RBC: 5.48 MIL/uL (ref 4.20–5.80)
RDW: 14 % (ref 11.0–15.0)
WBC: 6.3 10*3/uL (ref 3.8–10.8)

## 2015-11-11 LAB — HEPATIC FUNCTION PANEL
ALK PHOS: 71 U/L (ref 40–115)
ALT: 39 U/L (ref 9–46)
AST: 25 U/L (ref 10–40)
Albumin: 4.5 g/dL (ref 3.6–5.1)
BILIRUBIN DIRECT: 0.1 mg/dL (ref <=0.2)
BILIRUBIN INDIRECT: 0.6 mg/dL (ref 0.2–1.2)
BILIRUBIN TOTAL: 0.7 mg/dL (ref 0.2–1.2)
Total Protein: 7.2 g/dL (ref 6.1–8.1)

## 2015-11-11 LAB — BASIC METABOLIC PANEL WITH GFR
BUN: 13 mg/dL (ref 7–25)
CO2: 27 mmol/L (ref 20–31)
Calcium: 9.5 mg/dL (ref 8.6–10.3)
Chloride: 104 mmol/L (ref 98–110)
Creat: 0.88 mg/dL (ref 0.60–1.35)
Glucose, Bld: 108 mg/dL — ABNORMAL HIGH (ref 65–99)
POTASSIUM: 4.3 mmol/L (ref 3.5–5.3)
SODIUM: 140 mmol/L (ref 135–146)

## 2015-11-11 LAB — TSH: TSH: 1.79 mIU/L (ref 0.40–4.50)

## 2015-11-11 LAB — HEMOGLOBIN A1C
Hgb A1c MFr Bld: 6.9 % — ABNORMAL HIGH (ref <5.7)
Mean Plasma Glucose: 151 mg/dL

## 2015-11-11 LAB — MAGNESIUM: Magnesium: 2 mg/dL (ref 1.5–2.5)

## 2015-11-11 MED ORDER — GLIPIZIDE 5 MG PO TABS: ORAL_TABLET | ORAL | 5 refills | Status: DC

## 2015-11-11 MED ORDER — METFORMIN HCL ER 500 MG PO TB24: ORAL_TABLET | ORAL | 11 refills | Status: DC

## 2015-11-11 NOTE — Patient Instructions (Addendum)
Add ENTERIC COATED low dose 81 mg Aspirin daily OR can do every other day if you have easy bruising to protect your heart and head. As well as to reduce risk of Colon Cancer by 20 %, Skin Cancer by 26 % , Melanoma by 46% and Pancreatic cancer by 60%   Get artifical tears over the counter at [employer] or any store Can use 2 drops each eye up to 4-6 x a day, make sure you use before and after work Try to wear eye protection at work to block wind/dirt Need to see eye doctor.   Glipizide take 1/2 pill twice daily with food Metformin the goal is 2 pills twice daily with food Need to get there slowly, slowly increase the number of pills you take a day.  Take 2 pills for 1 month, then 3 pills a day for 1 month and goal is 4 pills a day.   Still take lipitor 1/2 pill 3 days a week.   We are starting you on Metformin to prevent or treat diabetes. Metformin does not cause low blood sugars. In order to create energy your cells need insulin and sugar but sometime your cells do not accept the insulin and this can cause increased sugars and decreased energy. The Metformin helps your cells accept insulin and the sugar to give you more energy.   The two most common side effects are nausea and diarrhea, follow these rules to avoid it! You can take imodium per box instructions when starting metformin if needed.   Rules of metformin: 1) start out slow with only one pill daily. Our goal for you is 4 pills a day or 2000mg  total.  2) take with your largest meal. 3) Take with least amount of carbs.   Call if you have any problems.      Bad carbs also include fruit juice, alcohol, and sweet tea. These are empty calories that do not signal to your brain that you are full.   Please remember the good carbs are still carbs which convert into sugar. So please measure them out no more than 1/2-1 cup of rice, oatmeal, pasta, and beans  Veggies are however free foods! Pile them on.   Not all fruit is created equal.  Please see the list below, the fruit at the bottom is higher in sugars than the fruit at the top. Please avoid all dried fruits.

## 2015-11-11 NOTE — Progress Notes (Signed)
Assessment and Plan:   Flu vaccine need -     Flu vaccine 6-4883mo preservative free IM  Diabetes mellitus without complication (HCC) Discussed general issues about diabetes pathophysiology and management., Educational material distributed., Suggested low cholesterol diet., Encouraged aerobic exercise., Discussed foot care., Reminded to get yearly retinal exam. -     glipiZIDE (GLUCOTROL) 5 MG tablet; Take 1/2 to 1 tablet  2x / day before meals for Diabetes -     Hemoglobin A1c - -     metFORMIN (GLUCOPHAGE XR) 500 MG 24 hr tablet; Take 2 tablets twice daily with meals- increase to 4 a day - add ASA 81mg   Essential hypertension - continue medications, DASH diet, exercise and monitor at home. Call if greater than 130/80.  -     CBC with Differential/Platelet -     BASIC METABOLIC PANEL WITH GFR -     Hepatic function panel -     TSH  Mixed hyperlipidemia -continue medications, check lipids, decrease fatty foods, increase activity.  - continue 1/2 pill 3 x a week -     Lipid panel  Vitamin D deficiency -     VITAMIN D 25 Hydroxy (Vit-D Deficiency, Fractures)  Medication management -     Magnesium  Ptyregium Get artifical tears, if not better will refer to eye doctor   Continue diet and meds as discussed. Further disposition pending results of labs.  HPI 50 y.o. male  presents for 3 month follow up  has a past medical history of Diabetes mellitus without complication (HCC); Hyperlipidemia; and Vitamin D deficiency.  His blood pressure has been controlled at home, today their BP is BP: 118/68  He does not workout. He denies chest pain, shortness of breath, dizziness.  He is on cholesterol medication and denies myalgias. His cholesterol is at goal. The cholesterol last visit was:   Lab Results  Component Value Date   CHOL 152 08/11/2015   HDL 43 08/11/2015   LDLCALC 79 08/11/2015   TRIG 152 (H) 08/11/2015   CHOLHDL 3.5 08/11/2015    He has been working on diet and exercise  for diabetes on glipizide 1/2 pill twice daily and metfomrin on 1 pill once daily, and denies paresthesia of the feet, polydipsia, polyuria and visual disturbances. His sugars were worse because he was not able to work out due to right knee meniscal tear, has seen ortho and is now released back to work out. He is on bASA, he is not on ACE  Last A1C in the office was:  Lab Results  Component Value Date   HGBA1C 8.2 (H) 08/11/2015   Patient is on Vitamin D supplement.   Lab Results  Component Value Date   VD25OH 40 08/11/2015     He Current Medications:  Current Outpatient Prescriptions on File Prior to Visit  Medication Sig Dispense Refill  . atorvastatin (LIPITOR) 80 MG tablet Take 1/2 to 1 tablet daily or as directed 30 tablet 5  . Cholecalciferol (VITAMIN D3) 5000 units TABS Take 5,000 Units by mouth daily.    Marland Kitchen. glipiZIDE (GLUCOTROL) 5 MG tablet Take 1/2 to 1 tablet      3 x / day before meals for Diabetes 90 tablet 5  . meloxicam (MOBIC) 15 MG tablet Take once daily as needed daily with food for pain, do not take aleve/ibuprofen with it, can take tylenol 90 tablet 0  . metFORMIN (GLUCOPHAGE XR) 500 MG 24 hr tablet Take 2 tablets twice daily with meals  360 tablet 11   No current facility-administered medications on file prior to visit.    Medical History:  Past Medical History:  Diagnosis Date  . Diabetes mellitus without complication (HCC)   . Hyperlipidemia   . Vitamin D deficiency    Allergies:  Allergies  Allergen Reactions  . Poison Ivy Extract [Poison Ivy Extract] Rash    Review of Systems:  Review of Systems  Constitutional: Negative.   HENT: Negative.   Eyes: Negative.   Respiratory: Negative.   Cardiovascular: Negative.   Gastrointestinal: Negative.   Genitourinary: Negative.   Musculoskeletal: Positive for joint pain and myalgias. Negative for back pain, falls and neck pain.  Skin: Negative.   Neurological: Negative.   Endo/Heme/Allergies: Negative.    Psychiatric/Behavioral: Negative.     Family history- Review and unchanged Social history- Review and unchanged Physical Exam: BP 118/68   Pulse 86   Temp 97.7 F (36.5 C)   Resp 16   Ht 5\' 4"  (1.626 m)   Wt 162 lb 6.4 oz (73.7 kg)   SpO2 98%   BMI 27.88 kg/m  Wt Readings from Last 3 Encounters:  11/11/15 162 lb 6.4 oz (73.7 kg)  08/11/15 154 lb 3.2 oz (69.9 kg)  04/27/15 155 lb (70.3 kg)   General Appearance: Well nourished, in no apparent distress. Eyes: PERRLA, EOMs, conjunctiva no swelling or erythema Sinuses: No Frontal/maxillary tenderness ENT/Mouth: Ext aud canals clear, TMs without erythema, bulging. No erythema, swelling, or exudate on post pharynx.  Tonsils not swollen or erythematous. Hearing normal.  Neck: Supple, thyroid normal.  Respiratory: Respiratory effort normal, BS equal bilaterally without rales, rhonchi, wheezing or stridor.  Cardio: RRR with no MRGs. Brisk peripheral pulses without edema.  Abdomen: Soft, + BS,  Non tender, no guarding, rebound, hernias, masses. Lymphatics: Non tender without lymphadenopathy.  Musculoskeletal: Full ROM, 5/5 strength, Normal gait, Left AC tender and prominent compared to right, full ROM Skin: Warm, dry without rashes, lesions, ecchymosis.  Neuro: Cranial nerves intact. Normal muscle tone, no cerebellar symptoms. Psych: Awake and oriented X 3, normal affect, Insight and Judgment appropriate.    Quentin Mulling, PA-C 10:15 AM [facility]

## 2015-11-12 LAB — VITAMIN D 25 HYDROXY (VIT D DEFICIENCY, FRACTURES): Vit D, 25-Hydroxy: 32 ng/mL (ref 30–100)

## 2016-02-01 ENCOUNTER — Ambulatory Visit (INDEPENDENT_AMBULATORY_CARE_PROVIDER_SITE_OTHER): Payer: 59 | Admitting: Internal Medicine

## 2016-02-01 ENCOUNTER — Encounter: Payer: Self-pay | Admitting: Internal Medicine

## 2016-02-01 VITALS — BP 136/80 | HR 64 | Temp 97.3°F | Resp 16 | Ht 64.5 in | Wt 159.8 lb

## 2016-02-01 DIAGNOSIS — R6889 Other general symptoms and signs: Secondary | ICD-10-CM

## 2016-02-01 DIAGNOSIS — Z79899 Other long term (current) drug therapy: Secondary | ICD-10-CM

## 2016-02-01 DIAGNOSIS — E119 Type 2 diabetes mellitus without complications: Secondary | ICD-10-CM

## 2016-02-01 DIAGNOSIS — I10 Essential (primary) hypertension: Secondary | ICD-10-CM | POA: Diagnosis not present

## 2016-02-01 DIAGNOSIS — R5383 Other fatigue: Secondary | ICD-10-CM | POA: Diagnosis not present

## 2016-02-01 DIAGNOSIS — E782 Mixed hyperlipidemia: Secondary | ICD-10-CM

## 2016-02-01 DIAGNOSIS — Z1212 Encounter for screening for malignant neoplasm of rectum: Secondary | ICD-10-CM

## 2016-02-01 DIAGNOSIS — Z0001 Encounter for general adult medical examination with abnormal findings: Secondary | ICD-10-CM | POA: Diagnosis not present

## 2016-02-01 DIAGNOSIS — Z136 Encounter for screening for cardiovascular disorders: Secondary | ICD-10-CM | POA: Diagnosis not present

## 2016-02-01 DIAGNOSIS — E559 Vitamin D deficiency, unspecified: Secondary | ICD-10-CM

## 2016-02-01 DIAGNOSIS — Z111 Encounter for screening for respiratory tuberculosis: Secondary | ICD-10-CM

## 2016-02-01 DIAGNOSIS — Z125 Encounter for screening for malignant neoplasm of prostate: Secondary | ICD-10-CM

## 2016-02-01 LAB — CBC WITH DIFFERENTIAL/PLATELET
BASOS ABS: 60 {cells}/uL (ref 0–200)
Basophils Relative: 1 %
Eosinophils Absolute: 120 cells/uL (ref 15–500)
Eosinophils Relative: 2 %
HEMATOCRIT: 46.6 % (ref 38.5–50.0)
HEMOGLOBIN: 15.5 g/dL (ref 13.2–17.1)
LYMPHS ABS: 1740 {cells}/uL (ref 850–3900)
Lymphocytes Relative: 29 %
MCH: 30.5 pg (ref 27.0–33.0)
MCHC: 33.3 g/dL (ref 32.0–36.0)
MCV: 91.6 fL (ref 80.0–100.0)
MONO ABS: 360 {cells}/uL (ref 200–950)
MPV: 11.9 fL (ref 7.5–12.5)
Monocytes Relative: 6 %
NEUTROS ABS: 3720 {cells}/uL (ref 1500–7800)
NEUTROS PCT: 62 %
Platelets: 197 10*3/uL (ref 140–400)
RBC: 5.09 MIL/uL (ref 4.20–5.80)
RDW: 13.7 % (ref 11.0–15.0)
WBC: 6 10*3/uL (ref 3.8–10.8)

## 2016-02-01 LAB — TSH: TSH: 1.98 m[IU]/L (ref 0.40–4.50)

## 2016-02-01 LAB — VITAMIN B12: VITAMIN B 12: 509 pg/mL (ref 200–1100)

## 2016-02-01 LAB — HEMOGLOBIN A1C
Hgb A1c MFr Bld: 6.7 % — ABNORMAL HIGH (ref <5.7)
Mean Plasma Glucose: 146 mg/dL

## 2016-02-01 LAB — PSA: PSA: 0.5 ng/mL (ref <=4.0)

## 2016-02-01 NOTE — Progress Notes (Signed)
ADULT & ADOLESCENT INTERNAL MEDICINE   Victor Mcintyre, M.D.    Victor Mcintyre. Victor Mcintyre, P.A.-C      Victor Mcintyre, P.A.-C  [facility]                [address]                [place], [place]. [address] Telephone [phone] Telefax [phone] Annual  Screening/Preventative Visit  & Comprehensive Evaluation & Examination     This very nice 51 y.o. Married Hispanic man presents for a Screening /Preventative Visit & comprehensive evaluation and management of multiple medical co-morbidities.  Patient has been followed for labile HTN, T2_NIDDM, Hyperlipidemia and Vitamin D Deficiency.     Patient has hx/o labile HTN and is expectantly monitored.  Patient's BP has been controlled at home.  Today's BP is at goal - 136/80. Patient denies any cardiac symptoms as chest pain, palpitations, shortness of breath, dizziness or ankle swelling.     Patient's hyperlipidemia is controlled with diet and medications. Patient denies myalgias or other medication SE's. Last lipids were at goal albeit sl elevated Trig's: Lab Results  Component Value Date   CHOL 179 11/11/2015   HDL 45 11/11/2015   LDLCALC 102 11/11/2015   TRIG 162 (H) 11/11/2015   CHOLHDL 4.0 11/11/2015      Patient has T2_NIDDM circa 2013 with A1c 6.7% and patient denies reactive hypoglycemic symptoms, visual blurring, diabetic polys or paresthesias. He does not monitor CBG's as recommended in the past.  Last A1c was not at goal: Lab Results  Component Value Date   HGBA1C 6.9 (H) 11/11/2015       Finally, patient has history of Vitamin D Deficiency in 2013 of "24" and "21" in 2015  And "24 " in 2016  last vitamin D was still very low as patient has been reticent to take recommend Vit D supplements.  Lab Results  Component Value Date   VD25OH 32 11/11/2015   Current Outpatient Prescriptions on File Prior to Visit  Medication Sig  . Cholecalciferol (VITAMIN D3) 5000 units TABS Take 5,000  Units by mouth daily.  Marland Kitchen glipiZIDE (GLUCOTROL) 5 MG tablet Take 1/2 to 1 tablet  2x / day before meals for Diabetes  . meloxicam (MOBIC) 15 MG tablet Take once daily as needed daily with food for pain, do not take aleve/ibuprofen with it, can take tylenol  . metFORMIN (GLUCOPHAGE XR) 500 MG 24 hr tablet Take 2 tablets twice daily with meals  . atorvastatin (LIPITOR) 80 MG tablet Take 1/2 to 1 tablet daily or as directed   No current facility-administered medications on file prior to visit.    Allergies  Allergen Reactions  . Poison Ivy Extract [Poison Ivy Extract] Rash   Past Medical History:  Diagnosis Date  . Diabetes mellitus without complication (HCC)   . Hyperlipidemia   . Vitamin D deficiency    Health Maintenance  Topic Date Due  . FOOT EXAM  01/24/1977  . OPHTHALMOLOGY EXAM  01/24/1977  . URINE MICROALBUMIN  01/26/2016  . HEMOGLOBIN A1C  05/11/2016  . PNEUMOCOCCAL POLYSACCHARIDE VACCINE (2) 12/10/2016  . TETANUS/TDAP  12/10/2021  . INFLUENZA VACCINE  Completed  . HIV Screening  Completed   Immunization History  Administered Date(s) Administered  . Influenza Split 12/29/2013  . Influenza, Seasonal, Injecte, Preservative Fre 11/11/2015  . PPD Test 12/29/2012, 12/29/2013, 01/26/2015  . Pneumococcal Polysaccharide-23 12/11/2011  . Tdap 12/11/2011   Past Surgical History:  Procedure Laterality Date  . LITHOTRIPSY  2002   Family History  Problem Relation Age of Onset  . Cancer Mother   . Diabetes Father    Social History   Social History  . Marital status: Single    Spouse name: N/A  . Number of children: N/A  . Years of education: N/A   Occupational History  .  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 Smoking status: Never Smoker  . Smokeless tobacco: Not on file  . Alcohol use Yes     Comment: rarely drinks  . Drug use: No  . Sexual activity: Not on file    ROS Constitutional: Denies fever,  chills, weight loss/gain, headaches, insomnia,  night sweats or change in appetite. Does c/o fatigue. Eyes: Denies redness, blurred vision, diplopia, discharge, itchy or watery eyes.  ENT: Denies discharge, congestion, post nasal drip, epistaxis, sore throat, earache, hearing loss, dental pain, Tinnitus, Vertigo, Sinus pain or snoring.  Cardio: Denies chest pain, palpitations, irregular heartbeat, syncope, dyspnea, diaphoresis, orthopnea, PND, claudication or edema Respiratory: denies cough, dyspnea, DOE, pleurisy, hoarseness, laryngitis or wheezing.  Gastrointestinal: Denies dysphagia, heartburn, reflux, water brash, pain, cramps, nausea, vomiting, bloating, diarrhea, constipation, hematemesis, melena, hematochezia, jaundice or hemorrhoids Genitourinary: Denies dysuria, frequency, urgency, nocturia, hesitancy, discharge, hematuria or flank pain Musculoskeletal: Denies arthralgia, myalgia, stiffness, Jt. Swelling, pain, limp or strain/sprain. Denies Falls. Skin: Denies puritis, rash, hives, warts, acne, eczema or change in skin lesion Neuro: No weakness, tremor, incoordination, spasms, paresthesia or pain Psychiatric: Denies confusion, memory loss or sensory loss. Denies Depression. Endocrine: Denies change in weight, skin, hair change, nocturia, and paresthesia, diabetic polys, visual blurring or hyper / hypo glycemic episodes.  Heme/Lymph: No excessive bleeding, bruising or enlarged lymph nodes.  Physical Exam  BP 136/80   Pulse 64   Temp 97.3 F (36.3 C)   Resp 16   Ht 5' 4.5" (1.638 m)   Wt 159 lb 12.8 oz (72.5 kg)   BMI 27.01 kg/m   General Appearance: Well nourished, in no apparent distress.  Eyes: PERRLA, EOMs, conjunctiva no swelling or erythema, normal fundi and vessels. Sinuses: No frontal/maxillary tenderness ENT/Mouth: EACs patent / TMs  nl. Nares clear without erythema, swelling, mucoid exudates. Oral hygiene is good. No erythema, swelling, or exudate. Tongue normal,  non-obstructing. Tonsils not swollen or erythematous. Hearing normal.  Neck: Supple, thyroid normal. No bruits, nodes or JVD. Respiratory: Respiratory effort normal.  BS equal and clear bilateral without rales, rhonci, wheezing or stridor. Cardio: Heart sounds are normal with regular rate and rhythm and no murmurs, rubs or gallops. Peripheral pulses are normal and equal bilaterally without edema. No aortic or femoral bruits. Chest: symmetric with normal excursions and percussion.  Abdomen: Soft, with Nl bowel sounds. Nontender, no guarding, rebound, hernias, masses, or organomegaly.  Lymphatics: Non tender without lymphadenopathy.  Genitourinary: No hernias.Testes nl. DRE - prostate nl for age - smooth & firm w/o nodules. Musculoskeletal: Full ROM all peripheral extremities, joint stability, 5/5 strength, and normal gait. Skin: Warm and dry without rashes, lesions, cyanosis, clubbing or  ecchymosis.  Neuro: Cranial nerves intact, reflexes equal bilaterally. Normal muscle tone, no cerebellar symptoms. Sensation intact to touch , Vibratory and Monofilament. testing to the toes bilaterally.  Pysch: Alert and oriented X 3 with normal affect, insight and judgment appropriate.   Assessment and Plan  1. Annual Preventative/Screening Exam    2. Essential hypertension  - Microalbumin /  creatinine urine ratio - EKG 12-Lead - Urinalysis, Routine w reflex microscopic - CBC with Differential/Platelet - BASIC METABOLIC PANEL WITH GFR - TSH  3. Mixed hyperlipidemia  - EKG 12-Lead - Hepatic function panel - TSH - Lipid panel  4. Diabetes mellitus without complication (HCC)  - Microalbumin / creatinine urine ratio - EKG 12-Lead - HM DIABETES FOOT EXAM - LOW EXTREMITY NEUR EXAM DOCUM - Hemoglobin A1c - Insulin, random  5. Vitamin D deficiency  - VITAMIN D 25 Hydroxy  6. Screening for rectal cancer  - POC Hemoccult Bld/Stl   7. Prostate cancer screening  - PSA  8. Screening  examination for pulmonary tuberculosis   9. Screening for ischemic heart disease  - EKG 12-Lead - Lipid panel  10. Fatigue, unspecified type  - Vitamin B12 - Iron and TIBC - Testosterone - CBC with Differential/Platelet - TSH  11. Medication management  - Urinalysis, Routine w reflex microscopic - CBC with Differential/Platelet - BASIC METABOLIC PANEL WITH GFR - Hepatic function panel - Magnesium - VITAMIN D 25 Hydroxy        Continue prudent diet as discussed, weight control, BP monitoring, regular exercise, and medications as discussed.  Discussed med effects and SE's. Routine screening labs and tests as requested with regular follow-up as recommended. Over 40 minutes of exam, counseling, chart review and high complex critical decision making was performed

## 2016-02-01 NOTE — Patient Instructions (Signed)

## 2016-02-02 LAB — URINALYSIS, MICROSCOPIC ONLY
BACTERIA UA: NONE SEEN [HPF]
CRYSTALS: NONE SEEN [HPF]
Casts: NONE SEEN [LPF]
RBC / HPF: NONE SEEN RBC/HPF (ref <=2)
SQUAMOUS EPITHELIAL / LPF: NONE SEEN [HPF] (ref <=5)
Yeast: NONE SEEN [HPF]

## 2016-02-02 LAB — URINALYSIS, ROUTINE W REFLEX MICROSCOPIC
BILIRUBIN URINE: NEGATIVE
HGB URINE DIPSTICK: NEGATIVE
KETONES UR: NEGATIVE
Leukocytes, UA: NEGATIVE
NITRITE: NEGATIVE
PROTEIN: NEGATIVE
Specific Gravity, Urine: 1.02 (ref 1.001–1.035)
pH: 5.5 (ref 5.0–8.0)

## 2016-02-02 LAB — HEPATIC FUNCTION PANEL
ALT: 24 U/L (ref 9–46)
AST: 19 U/L (ref 10–40)
Albumin: 4.2 g/dL (ref 3.6–5.1)
Alkaline Phosphatase: 68 U/L (ref 40–115)
BILIRUBIN DIRECT: 0.1 mg/dL (ref <=0.2)
BILIRUBIN INDIRECT: 0.3 mg/dL (ref 0.2–1.2)
TOTAL PROTEIN: 6.8 g/dL (ref 6.1–8.1)
Total Bilirubin: 0.4 mg/dL (ref 0.2–1.2)

## 2016-02-02 LAB — IRON AND TIBC
%SAT: 40 % (ref 15–60)
Iron: 132 ug/dL (ref 50–180)
TIBC: 333 ug/dL (ref 250–425)
UIBC: 201 ug/dL (ref 125–400)

## 2016-02-02 LAB — LIPID PANEL
CHOLESTEROL: 185 mg/dL (ref <200)
HDL: 44 mg/dL (ref >40)
LDL CALC: 98 mg/dL (ref <100)
TRIGLYCERIDES: 217 mg/dL — AB (ref <150)
Total CHOL/HDL Ratio: 4.2 Ratio (ref <5.0)
VLDL: 43 mg/dL — AB (ref <30)

## 2016-02-02 LAB — VITAMIN D 25 HYDROXY (VIT D DEFICIENCY, FRACTURES): VIT D 25 HYDROXY: 24 ng/mL — AB (ref 30–100)

## 2016-02-02 LAB — BASIC METABOLIC PANEL WITH GFR
BUN: 14 mg/dL (ref 7–25)
CALCIUM: 9.6 mg/dL (ref 8.6–10.3)
CHLORIDE: 102 mmol/L (ref 98–110)
CO2: 21 mmol/L (ref 20–31)
Creat: 0.83 mg/dL (ref 0.60–1.35)
GFR, Est African American: >89 mL/min (ref >=60)
GLUCOSE: 240 mg/dL — AB (ref 65–99)
Potassium: 4.3 mmol/L (ref 3.5–5.3)
Sodium: 140 mmol/L (ref 135–146)

## 2016-02-02 LAB — MICROALBUMIN / CREATININE URINE RATIO
CREATININE, URINE: 74 mg/dL (ref 20–370)
MICROALB UR: 0.6 mg/dL
MICROALB/CREAT RATIO: 8 ug/mg{creat} (ref <30)

## 2016-02-02 LAB — INSULIN, RANDOM: INSULIN: 48.3 u[IU]/mL — AB (ref 2.0–19.6)

## 2016-02-02 LAB — MAGNESIUM: Magnesium: 1.9 mg/dL (ref 1.5–2.5)

## 2016-02-02 LAB — TESTOSTERONE: Testosterone: 250 ng/dL (ref 250–827)

## 2016-02-06 ENCOUNTER — Encounter: Payer: Self-pay | Admitting: *Deleted

## 2016-05-04 ENCOUNTER — Encounter: Payer: Self-pay | Admitting: Physician Assistant

## 2016-05-04 ENCOUNTER — Ambulatory Visit (INDEPENDENT_AMBULATORY_CARE_PROVIDER_SITE_OTHER): Payer: 59 | Admitting: Physician Assistant

## 2016-05-04 VITALS — BP 130/80 | HR 78 | Temp 97.7°F | Resp 16 | Ht 64.5 in | Wt 160.6 lb

## 2016-05-04 DIAGNOSIS — E119 Type 2 diabetes mellitus without complications: Secondary | ICD-10-CM

## 2016-05-04 DIAGNOSIS — R05 Cough: Secondary | ICD-10-CM | POA: Diagnosis not present

## 2016-05-04 DIAGNOSIS — E782 Mixed hyperlipidemia: Secondary | ICD-10-CM

## 2016-05-04 DIAGNOSIS — I10 Essential (primary) hypertension: Secondary | ICD-10-CM

## 2016-05-04 DIAGNOSIS — E559 Vitamin D deficiency, unspecified: Secondary | ICD-10-CM

## 2016-05-04 DIAGNOSIS — Z79899 Other long term (current) drug therapy: Secondary | ICD-10-CM

## 2016-05-04 DIAGNOSIS — R059 Cough, unspecified: Secondary | ICD-10-CM

## 2016-05-04 LAB — CBC WITH DIFFERENTIAL/PLATELET
BASOS ABS: 79 {cells}/uL (ref 0–200)
Basophils Relative: 1 %
EOS ABS: 158 {cells}/uL (ref 15–500)
Eosinophils Relative: 2 %
HCT: 48.1 % (ref 38.5–50.0)
Hemoglobin: 16 g/dL (ref 13.2–17.1)
Lymphocytes Relative: 22 %
Lymphs Abs: 1738 cells/uL (ref 850–3900)
MCH: 29.6 pg (ref 27.0–33.0)
MCHC: 33.3 g/dL (ref 32.0–36.0)
MCV: 89.1 fL (ref 80.0–100.0)
MONOS PCT: 6 %
MPV: 11.2 fL (ref 7.5–12.5)
Monocytes Absolute: 474 cells/uL (ref 200–950)
Neutro Abs: 5451 cells/uL (ref 1500–7800)
Neutrophils Relative %: 69 %
PLATELETS: 238 10*3/uL (ref 140–400)
RBC: 5.4 MIL/uL (ref 4.20–5.80)
RDW: 14.1 % (ref 11.0–15.0)
WBC: 7.9 10*3/uL (ref 3.8–10.8)

## 2016-05-04 LAB — HEPATIC FUNCTION PANEL
ALK PHOS: 68 U/L (ref 40–115)
ALT: 29 U/L (ref 9–46)
AST: 21 U/L (ref 10–40)
Albumin: 4.2 g/dL (ref 3.6–5.1)
BILIRUBIN INDIRECT: 0.4 mg/dL (ref 0.2–1.2)
Bilirubin, Direct: 0.1 mg/dL (ref <=0.2)
TOTAL PROTEIN: 6.8 g/dL (ref 6.1–8.1)
Total Bilirubin: 0.5 mg/dL (ref 0.2–1.2)

## 2016-05-04 LAB — LIPID PANEL
Cholesterol: 209 mg/dL — ABNORMAL HIGH (ref <200)
HDL: 38 mg/dL — ABNORMAL LOW (ref >40)
LDL CALC: 124 mg/dL — AB (ref <100)
Total CHOL/HDL Ratio: 5.5 Ratio — ABNORMAL HIGH (ref <5.0)
Triglycerides: 235 mg/dL — ABNORMAL HIGH (ref <150)
VLDL: 47 mg/dL — AB (ref <30)

## 2016-05-04 LAB — BASIC METABOLIC PANEL WITH GFR
BUN: 11 mg/dL (ref 7–25)
CHLORIDE: 103 mmol/L (ref 98–110)
CO2: 24 mmol/L (ref 20–31)
Calcium: 9.5 mg/dL (ref 8.6–10.3)
Creat: 0.78 mg/dL (ref 0.60–1.35)
GFR, Est African American: >89 mL/min (ref >=60)
GLUCOSE: 162 mg/dL — AB (ref 65–99)
POTASSIUM: 4.2 mmol/L (ref 3.5–5.3)
Sodium: 137 mmol/L (ref 135–146)

## 2016-05-04 LAB — TSH: TSH: 2.03 mIU/L (ref 0.40–4.50)

## 2016-05-04 MED ORDER — ATORVASTATIN CALCIUM 80 MG PO TABS: ORAL_TABLET | ORAL | 5 refills | Status: DC

## 2016-05-04 MED ORDER — PROMETHAZINE-DM 6.25-15 MG/5ML PO SYRP: 5.0000 mL | ORAL_SOLUTION | Freq: Four times a day (QID) | ORAL | 1 refills | Status: DC | PRN

## 2016-05-04 NOTE — Progress Notes (Signed)
Assessment and Plan:   Diabetes mellitus without complication [facility]) Discussed general issues about diabetes pathophysiology and management., Educational material distributed., Suggested low cholesterol diet., Encouraged aerobic exercise., Discussed foot care., Reminded to get yearly retinal exam. -     glipiZIDE (GLUCOTROL) 5 MG tablet; Take 1/2 to 1 tablet  2x / day before meals for Diabetes -     Hemoglobin A1c - -     metFORMIN (GLUCOPHAGE XR) 500 MG 24 hr tablet; Take 2 tablets twice daily with meals- increase to 4 a day - add ASA   Essential hypertension - continue medications, DASH diet, exercise and monitor at home. Call if greater than 130/80.  -     CBC with Differential/Platelet -     BASIC METABOLIC PANEL WITH GFR -     Hepatic function panel -     TSH  Mixed hyperlipidemia -continue medications, check lipids, decrease fatty foods, increase activity.  - continue 1/2 pill 3 x a week -     Lipid panel  Vitamin D deficiency -     VITAMIN D 25 Hydroxy (Vit-D Deficiency, Fractures)  Medication management -     Magnesium  Ptyregium Get artifical tears, if not better will refer to eye doctor  Allergy Get on allergy pill, get on flonase, if worse call the office  Continue diet and meds as discussed. Further disposition pending results of labs. Future Appointments Date Time Provider Department Center  08/10/2016 10:30 AM [name], MD [facility] None  03/05/2017 10:00 AM [name], MD [facility] None    HPI 51 y.o. male  presents for 3 month follow up  has a past medical history of Diabetes mellitus without complication (HCC); Hyperlipidemia; and Vitamin D deficiency.  Has had cough x 2 weeks, no fever chills, with yellow mucus, has taken robtussi  His blood pressure has been controlled at home, today their BP is BP: 130/80  He does not workout. He denies chest pain, shortness of breath, dizziness.  He is on cholesterol medication and denies myalgias. His  cholesterol is not at goal less than 70. The cholesterol last visit was:   Lab Results  Component Value Date   CHOL 185 02/01/2016   HDL 44 02/01/2016   LDLCALC 98 02/01/2016   TRIG 217 (H) 02/01/2016   CHOLHDL 4.2 02/01/2016    He has been working on diet and exercise for diabetes on glipizide 1/2 pill twice daily and metfomrin on 1 pill once daily, and denies paresthesia of the feet, polydipsia, polyuria and visual disturbances. He is on bASA, he is not on ACE  Last A1C in the office was:  Lab Results  Component Value Date   HGBA1C 6.7 (H) 02/01/2016   Patient is on Vitamin D supplement.   Lab Results  Component Value Date   VD25OH 24 (L) 02/01/2016     BMI is Body mass index is 27.14 kg/m., he is working on diet and exercise. Wt Readings from Last 3 Encounters:  05/04/16 160 lb 9.6 oz (72.8 kg)  02/01/16 159 lb 12.8 oz (72.5 kg)  11/11/15 162 lb 6.4 oz (73.7 kg)    Current Medications:  Current Outpatient Prescriptions on File Prior to Visit  Medication Sig Dispense Refill  . Cholecalciferol (VITAMIN D3) 5000 units TABS Take 5,000 Units by mouth daily.    Marland Kitchen glipiZIDE (GLUCOTROL) 5 MG tablet Take 1/2 to 1 tablet  2x / day before meals for Diabetes 60 tablet 5  . meloxicam (MOBIC) 15 MG  tablet Take once daily as needed daily with food for pain, do not take aleve/ibuprofen with it, can take tylenol 90 tablet 0  . metFORMIN (GLUCOPHAGE XR) 500 MG 24 hr tablet Take 2 tablets twice daily with meals 360 tablet 11  . atorvastatin (LIPITOR) 80 MG tablet Take 1/2 to 1 tablet daily or as directed 30 tablet 5   No current facility-administered medications on file prior to visit.    Medical History:  Past Medical History:  Diagnosis Date  . Diabetes mellitus without complication (HCC)   . Hyperlipidemia   . Vitamin D deficiency    Allergies:  Allergies  Allergen Reactions  . Poison Ivy Extract [Poison Ivy Extract] Rash    Review of Systems:  Review of Systems   Constitutional: Negative.  Negative for chills and fever.  HENT: Positive for congestion. Negative for ear discharge, ear pain, hearing loss, nosebleeds, sinus pain, sore throat and tinnitus.   Eyes: Negative.   Respiratory: Positive for cough. Negative for hemoptysis, sputum production, shortness of breath, wheezing and stridor.   Cardiovascular: Negative.   Gastrointestinal: Negative.   Genitourinary: Negative.   Musculoskeletal: Positive for joint pain and myalgias. Negative for back pain, falls and neck pain.  Skin: Negative.   Neurological: Negative.   Endo/Heme/Allergies: Negative.   Psychiatric/Behavioral: Negative.     Family history- Review and unchanged Social history- Review and unchanged Physical Exam: BP 130/80   Pulse 78   Temp 97.7 F (36.5 C)   Resp 16   Ht 5' 4.5" (1.638 m)   Wt 160 lb 9.6 oz (72.8 kg)   SpO2 98%   BMI 27.14 kg/m  Wt Readings from Last 3 Encounters:  05/04/16 160 lb 9.6 oz (72.8 kg)  02/01/16 159 lb 12.8 oz (72.5 kg)  11/11/15 162 lb 6.4 oz (73.7 kg)   General Appearance: Well nourished, in no apparent distress. Eyes: PERRLA, EOMs, conjunctiva no swelling or erythema Sinuses: No Frontal/maxillary tenderness ENT/Mouth: Ext aud canals clear, TMs without erythema, bulging. No erythema, swelling, or exudate on post pharynx.  Tonsils not swollen or erythematous. Hearing normal.  Neck: Supple, thyroid normal.  Respiratory: Respiratory effort normal, BS equal bilaterally without rales, rhonchi, wheezing or stridor.  Cardio: RRR with no MRGs. Brisk peripheral pulses without edema.  Abdomen: Soft, + BS,  Non tender, no guarding, rebound, hernias, masses. Lymphatics: Non tender without lymphadenopathy.  Musculoskeletal: Full ROM, 5/5 strength, Normal gait Skin: Warm, dry without rashes, lesions, ecchymosis.  Neuro: Cranial nerves intact. Normal muscle tone, no cerebellar symptoms. Psych: Awake and oriented X 3, normal affect, Insight and Judgment  appropriate.    Quentin Mulling, PA-C 9:27 AM [facility]

## 2016-05-04 NOTE — Patient Instructions (Signed)
Get on allergy pill below, get on nasal spray below, use cough syrup, if not better or if you get worse call the office and I will send in an antibiotic.   Here are things you can do to help with this: - Try the Flonase or Nasonex. Remember to spray each nostril twice towards the outer part of your eye.  Do not sniff but instead pinch your nose and tilt your head down to help the medicine get into your sinuses.  The best time to do this is at bedtime.Stop if you get blurred vision or nose bleeds.   -Please pick one of the over the counter allergy medications below and take it once daily for allergies/cough.  Claritin or loratadine cheapest but likely the weakest  Zyrtec or certizine at night because it can make you sleepy The strongest is allegra or fexafinadine  Cheapest at [employer], [employer], [employer]   if worsening HA, changes vision/speech, imbalance, weakness go to the ER     Bad carbs also include fruit juice, alcohol, and sweet tea. These are empty calories that do not signal to your brain that you are full.   Please remember the good carbs are still carbs which convert into sugar. So please measure them out no more than 1/2-1 cup of rice, oatmeal, pasta, and beans  Veggies are however free foods! Pile them on.   Not all fruit is created equal. Please see the list below, the fruit at the bottom is higher in sugars than the fruit at the top. Please avoid all dried fruits.

## 2016-05-05 LAB — HEMOGLOBIN A1C
HEMOGLOBIN A1C: 6.9 % — AB (ref <5.7)
MEAN PLASMA GLUCOSE: 151 mg/dL

## 2016-05-09 NOTE — Progress Notes (Signed)
Pt aware of lab results & voiced understanding of those results.  Lab results & lab letter was mailed out per pt.

## 2016-05-29 LAB — HM DIABETES EYE EXAM

## 2016-05-31 ENCOUNTER — Encounter: Payer: Self-pay | Admitting: Physician Assistant

## 2016-05-31 ENCOUNTER — Ambulatory Visit (INDEPENDENT_AMBULATORY_CARE_PROVIDER_SITE_OTHER): Payer: 59 | Admitting: Physician Assistant

## 2016-05-31 ENCOUNTER — Ambulatory Visit (HOSPITAL_COMMUNITY)
Admission: RE | Admit: 2016-05-31 | Discharge: 2016-05-31 | Disposition: A | Discharge status: Home / Self Care | Payer: 59 | Source: Ambulatory Visit | Attending: Physician Assistant | Admitting: Physician Assistant

## 2016-05-31 ENCOUNTER — Other Ambulatory Visit: Payer: Self-pay | Admitting: Physician Assistant

## 2016-05-31 VITALS — BP 124/78 | HR 81 | Temp 97.5°F | Resp 16 | Ht 64.5 in | Wt 162.6 lb

## 2016-05-31 DIAGNOSIS — Z87442 Personal history of urinary calculi: Secondary | ICD-10-CM | POA: Insufficient documentation

## 2016-05-31 DIAGNOSIS — M533 Sacrococcygeal disorders, not elsewhere classified: Secondary | ICD-10-CM | POA: Insufficient documentation

## 2016-05-31 DIAGNOSIS — N200 Calculus of kidney: Secondary | ICD-10-CM

## 2016-05-31 MED ORDER — TAMSULOSIN HCL 0.4 MG PO CAPS: 0.4000 mg | ORAL_CAPSULE | Freq: Every day | ORAL | 1 refills | Status: DC

## 2016-05-31 NOTE — Progress Notes (Signed)
   Subjective:    Patient ID: Victor Mcintyre, male    DOB: 04/18/1965, 51 y.o.   MRN: [id number]  HPI 51 y.o. hispanic male presents after passing kidney stone, has history x 1 in the past was calcium stone. Was having some right AB pain for 1 week, then had inability to urinate and passed stone yesterday, has with him today. He denies any fever, chills, further pain, N/V, hematuria.   Blood pressure 124/78, pulse 81, temperature 97.5 F (36.4 C), resp. rate 16, height 5' 4.5" (1.638 m), weight 162 lb 9.6 oz (73.8 kg), SpO2 98 %.  Medications Current Outpatient Prescriptions on File Prior to Visit  Medication Sig  . atorvastatin (LIPITOR) 80 MG tablet Take 1/2 to 1 tablet daily or as directed  . Cholecalciferol (VITAMIN D3) 5000 units TABS Take 5,000 Units by mouth daily.  . meloxicam (MOBIC) 15 MG tablet Take once daily as needed daily with food for pain, do not take aleve/ibuprofen with it, can take tylenol  . metFORMIN (GLUCOPHAGE XR) 500 MG 24 hr tablet Take 2 tablets twice daily with meals  . glipiZIDE (GLUCOTROL) 5 MG tablet Take 1/2 to 1 tablet  2x / day before meals for Diabetes   No current facility-administered medications on file prior to visit.     Problem list He has Mixed hyperlipidemia; T2_NIDDM; Vitamin D deficiency; Medication management; and HTN (hypertension) on his problem list.  Review of Systems  Constitutional: Negative.   HENT: Negative.   Respiratory: Negative.   Cardiovascular: Negative.   Gastrointestinal: Negative.   Genitourinary: Negative.   Musculoskeletal: Negative.   Skin: Negative.   Neurological: Negative.   Psychiatric/Behavioral: Negative.        Objective:   Physical Exam  Constitutional: He is oriented to person, place, and time. He appears well-developed and well-nourished.  HENT:  Head: Normocephalic and atraumatic.  Right Ear: External ear normal.  Left Ear: External ear normal.  Nose: Nose normal.  Mouth/Throat: No  oropharyngeal exudate.  Eyes: EOM are normal. Pupils are equal, round, and reactive to light. Right eye exhibits no discharge. Left eye exhibits no discharge.  Injected bilaterally, no obvious FB or trauma  Neck: Normal range of motion. Neck supple. No JVD present. No thyromegaly present.  Cardiovascular: Normal rate, regular rhythm, normal heart sounds and intact distal pulses.   Pulmonary/Chest: Effort normal and breath sounds normal.  Abdominal: Soft. Bowel sounds are normal. He exhibits no distension and no mass. There is no tenderness. There is no rebound and no guarding.  Musculoskeletal: Normal range of motion. He exhibits no edema or tenderness.  Lymphadenopathy:    He has no cervical adenopathy.  Neurological: He is alert and oriented to person, place, and time. He has normal reflexes. No cranial nerve deficit. Coordination normal.  Skin: Skin is warm and dry.  Psychiatric: He has a normal mood and affect. His behavior is normal. Judgment and thought content normal.  Nursing note and vitals reviewed.      Assessment & Plan:   Kidney stone -     DG ABD KUB - no pain at this time, will see if any more stones - diet discussed - will send in flomax - will refer to urology

## 2016-05-31 NOTE — Patient Instructions (Signed)
Increase water stop the vitamin D for now No soda or tea If it happens again we can check labs or put you on a medication to help decrease them.   Dietary Guidelines to Help Prevent Kidney Stones Kidney stones are deposits of minerals and salts that form inside your kidneys. Your risk of developing kidney stones may be greater depending on your diet, your lifestyle, the medicines you take, and whether you have certain medical conditions. Most people can reduce their chances of developing kidney stones by following the instructions below. Depending on your overall health and the type of kidney stones you tend to develop, your dietitian may give you more specific instructions. What are tips for following this plan? Reading food labels   Choose foods with "no salt added" or "low-salt" labels. Limit your sodium intake to less than 1500 mg per day.  Choose foods with calcium for each meal and snack. Try to eat about 300 mg of calcium at each meal. Foods that contain 200-500 mg of calcium per serving include:  8 oz (237 ml) of milk, fortified nondairy milk, and fortified fruit juice.  8 oz (237 ml) of kefir, yogurt, and soy yogurt.  4 oz (118 ml) of tofu.  1 oz of cheese.  1 cup (300 g) of dried figs.  1 cup (91 g) of cooked broccoli.  1-3 oz can of sardines or mackerel.  Most people need 1000 to 1500 mg of calcium each day. Talk to your dietitian about how much calcium is recommended for you. Shopping   Buy plenty of fresh fruits and vegetables. Most people do not need to avoid fruits and vegetables, even if they contain nutrients that may contribute to kidney stones.  When shopping for convenience foods, choose:  Whole pieces of fruit.  Premade salads with dressing on the side.  Low-fat fruit and yogurt smoothies.  Avoid buying frozen meals or prepared deli foods.  Look for foods with live cultures, such as yogurt and kefir. Cooking   Do not add salt to food when cooking.  Place a salt shaker on the table and allow each person to add his or her own salt to taste.  Use vegetable protein, such as beans, textured vegetable protein (TVP), or tofu instead of meat in pasta, casseroles, and soups. Meal planning   Eat less salt, if told by your dietitian. To do this:  Avoid eating processed or premade food.  Avoid eating fast food.  Eat less animal protein, including cheese, meat, poultry, or fish, if told by your dietitian. To do this:  Limit the number of times you have meat, poultry, fish, or cheese each week. Eat a diet free of meat at least 2 days a week.  Eat only one serving each day of meat, poultry, fish, or seafood.  When you prepare animal protein, cut pieces into small portion sizes. For most meat and fish, one serving is about the size of one deck of cards.  Eat at least 5 servings of fresh fruits and vegetables each day. To do this:  Keep fruits and vegetables on hand for snacks.  Eat 1 piece of fruit or a handful of berries with breakfast.  Have a salad and fruit at lunch.  Have two kinds of vegetables at dinner.  Limit foods that are high in a substance called oxalate. These include:  Spinach.  Rhubarb.  Beets.  Potato chips and french fries.  Nuts.  If you regularly take a diuretic medicine, make sure  to eat at least 1-2 fruits or vegetables high in potassium each day. These include:  Avocado.  Banana.  Orange, prune, carrot, or tomato juice.  Baked potato.  Cabbage.  Beans and split peas. General instructions   Drink enough fluid to keep your urine clear or pale yellow. This is the most important thing you can do.  Talk to your health care provider and dietitian about taking daily supplements. Depending on your health and the cause of your kidney stones, you may be advised:  Not to take supplements with vitamin C.  Take all medicines and supplements as told by your health care provider.  Limit alcohol intake to  no more than 1 drink a day for nonpregnant women and 2 drinks a day for men. One drink equals 12 oz of beer, 5 oz of wine, or 1 oz of hard liquor.  Lose weight if told by your health care provider. Work with your dietitian to find strategies and an eating plan that works best for you. What foods are not recommended? Limit your intake of the following foods, or as told by your dietitian. Talk to your dietitian about specific foods you should avoid based on the type of kidney stones and your overall health. Grains  Breads. Bagels. Rolls. Baked goods. Salted crackers. Cereal. Pasta. Vegetables  Spinach. Rhubarb. Beets. Canned vegetables. Rosita Fire. Olives. Meats and other protein foods  Nuts. Nut butters. Large portions of meat, poultry, or fish. Salted or cured meats. Deli meats. Hot dogs. Sausages. Dairy  Cheese. Beverages  Regular soft drinks. Regular vegetable juice. Seasonings and other foods  Seasoning blends with salt. Salad dressings. Canned soups. Soy sauce. Ketchup. Barbecue sauce. Canned pasta sauce. Casseroles. Pizza. Lasagna. Frozen meals. Potato chips. [place] fries. Summary  You can reduce your risk of kidney stones by making changes to your diet.  The most important thing you can do is drink enough fluid. You should drink enough fluid to keep your urine clear or pale yellow.  Ask your health care provider or dietitian how much protein from animal sources you should eat each day, and also how much salt and calcium you should have each day. This information is not intended to replace advice given to you by your health care provider. Make sure you discuss any questions you have with your health care provider. Document Released: 05/05/2010 Document Revised: 12/20/2015 Document Reviewed: 12/20/2015 Elsevier Interactive Patient Education  2017 [employer].

## 2016-06-01 NOTE — Progress Notes (Signed)
Pt aware of lab results & voiced understanding of those results.

## 2016-06-08 DIAGNOSIS — H11003 Unspecified pterygium of eye, bilateral: Secondary | ICD-10-CM | POA: Diagnosis not present

## 2016-06-08 DIAGNOSIS — E119 Type 2 diabetes mellitus without complications: Secondary | ICD-10-CM | POA: Diagnosis not present

## 2016-06-08 DIAGNOSIS — H52212 Irregular astigmatism, left eye: Secondary | ICD-10-CM | POA: Diagnosis not present

## 2016-07-04 ENCOUNTER — Encounter: Payer: Self-pay | Admitting: Internal Medicine

## 2016-08-10 ENCOUNTER — Ambulatory Visit (INDEPENDENT_AMBULATORY_CARE_PROVIDER_SITE_OTHER): Payer: 59 | Admitting: Internal Medicine

## 2016-08-10 ENCOUNTER — Encounter: Payer: Self-pay | Admitting: Internal Medicine

## 2016-08-10 VITALS — BP 118/72 | HR 60 | Temp 97.7°F | Resp 16 | Ht 64.5 in | Wt 157.6 lb

## 2016-08-10 DIAGNOSIS — E119 Type 2 diabetes mellitus without complications: Secondary | ICD-10-CM | POA: Diagnosis not present

## 2016-08-10 DIAGNOSIS — Z79899 Other long term (current) drug therapy: Secondary | ICD-10-CM

## 2016-08-10 DIAGNOSIS — E559 Vitamin D deficiency, unspecified: Secondary | ICD-10-CM | POA: Diagnosis not present

## 2016-08-10 DIAGNOSIS — I10 Essential (primary) hypertension: Secondary | ICD-10-CM

## 2016-08-10 DIAGNOSIS — E782 Mixed hyperlipidemia: Secondary | ICD-10-CM | POA: Diagnosis not present

## 2016-08-10 LAB — HEPATIC FUNCTION PANEL
ALBUMIN: 4.2 g/dL (ref 3.6–5.1)
ALK PHOS: 76 U/L (ref 40–115)
ALT: 50 U/L — ABNORMAL HIGH (ref 9–46)
AST: 26 U/L (ref 10–40)
BILIRUBIN TOTAL: 0.6 mg/dL (ref 0.2–1.2)
Bilirubin, Direct: 0.1 mg/dL (ref <=0.2)
Indirect Bilirubin: 0.5 mg/dL (ref 0.2–1.2)
TOTAL PROTEIN: 6.8 g/dL (ref 6.1–8.1)

## 2016-08-10 LAB — CBC WITH DIFFERENTIAL/PLATELET
BASOS ABS: 56 {cells}/uL (ref 0–200)
Basophils Relative: 1 %
EOS ABS: 112 {cells}/uL (ref 15–500)
Eosinophils Relative: 2 %
HEMATOCRIT: 47.6 % (ref 38.5–50.0)
Hemoglobin: 16 g/dL (ref 13.2–17.1)
LYMPHS PCT: 27 %
Lymphs Abs: 1512 cells/uL (ref 850–3900)
MCH: 30.7 pg (ref 27.0–33.0)
MCHC: 33.6 g/dL (ref 32.0–36.0)
MCV: 91.2 fL (ref 80.0–100.0)
MONO ABS: 392 {cells}/uL (ref 200–950)
MONOS PCT: 7 %
MPV: 11.9 fL (ref 7.5–12.5)
Neutro Abs: 3528 cells/uL (ref 1500–7800)
Neutrophils Relative %: 63 %
PLATELETS: 205 10*3/uL (ref 140–400)
RBC: 5.22 MIL/uL (ref 4.20–5.80)
RDW: 14.1 % (ref 11.0–15.0)
WBC: 5.6 10*3/uL (ref 3.8–10.8)

## 2016-08-10 LAB — LIPID PANEL
Cholesterol: 182 mg/dL (ref <200)
HDL: 42 mg/dL (ref >40)
LDL CALC: 108 mg/dL — AB (ref <100)
Total CHOL/HDL Ratio: 4.3 Ratio (ref <5.0)
Triglycerides: 162 mg/dL — ABNORMAL HIGH (ref <150)
VLDL: 32 mg/dL — ABNORMAL HIGH (ref <30)

## 2016-08-10 LAB — BASIC METABOLIC PANEL WITH GFR
BUN: 11 mg/dL (ref 7–25)
CO2: 22 mmol/L (ref 20–31)
CREATININE: 0.8 mg/dL (ref 0.60–1.35)
Calcium: 9.1 mg/dL (ref 8.6–10.3)
Chloride: 101 mmol/L (ref 98–110)
GFR, Est African American: >89 mL/min (ref >=60)
GFR, Est Non African American: >89 mL/min (ref >=60)
GLUCOSE: 176 mg/dL — AB (ref 65–99)
Potassium: 4.3 mmol/L (ref 3.5–5.3)
Sodium: 137 mmol/L (ref 135–146)

## 2016-08-10 NOTE — Patient Instructions (Signed)

## 2016-08-10 NOTE — Progress Notes (Signed)
This very nice 51 y.o.  Married Hispanic Man presents for 6 month follow up with Hypertension, Hyperlipidemia, T2_NIDDM and Vitamin D Deficiency. Patient was seen in may after passing a kidney stone and was referred to Urology , but states they never contacted him for appt. He denies any further difficulty or blood in urine . He never picked up the Rx for Tamsulosin.       Patient is treated for HTN & BP has been controlled at home. Today's BP is at goal - 118/72. Patient has had no complaints of any cardiac type chest pain, palpitations, dyspnea / orthopnea / PND, dizziness, claudication, or dependent edema.     Hyperlipidemia is controlled with diet & meds. Patient denies myalgias or other med SE's. Last Lipids were not at goal: Lab Results  Component Value Date   CHOL 209 (H) 05/04/2016   HDL 38 (L) 05/04/2016   LDLCALC 124 (H) 05/04/2016   TRIG 235 (H) 05/04/2016   CHOLHDL 5.5 (H) 05/04/2016      Also, the patient has history of T2_NIDDM  (A1c 6.7% in 2013) and has had no symptoms of reactive hypoglycemia, diabetic polys, paresthesias or visual blurring.  He is not monitoring CBG's. Last A1c was not at goal: Lab Results  Component Value Date   HGBA1C 6.9 (H) 05/04/2016      Further, the patient also has history of Vitamin D Deficiency ("24" in 2013 and "21" in 2015) and has repeatedly been advised to take Vit D supplements. Last vitamin D was not at goal:  Lab Results  Component Value Date   VD25OH 24 (L) 02/01/2016   Current Outpatient Prescriptions on File Prior to Visit  Medication Sig  . atorvastatin (LIPITOR) 80 MG tablet Take 1/2 to 1 tablet daily or as directed  . Cholecalciferol (VITAMIN D3) 5000 units TABS Take 5,000 Units by mouth daily.  . meloxicam (MOBIC) 15 MG tablet Take once daily as needed daily with food for pain, do not take aleve/ibuprofen with it, can take tylenol  . metFORMIN (GLUCOPHAGE XR) 500 MG 24 hr tablet Take 2 tablets twice daily with meals  .  glipiZIDE (GLUCOTROL) 5 MG tablet Take 1/2 to 1 tablet  2x / day before meals for Diabetes   No current facility-administered medications on file prior to visit.    Allergies  Allergen Reactions  . Poison Ivy Extract [Poison Ivy Extract] Rash   PMHx:   Past Medical History:  Diagnosis Date  . Diabetes mellitus without complication (HCC)   . Hyperlipidemia   . Vitamin D deficiency    Immunization History  Administered Date(s) Administered  . Influenza Split 12/29/2013  . Influenza, Seasonal, Injecte, Preservative Fre 11/11/2015  . PPD Test 12/29/2012, 12/29/2013, 01/26/2015, 02/01/2016  . Pneumococcal Polysaccharide-23 12/11/2011  . Tdap 12/11/2011   Past Surgical History:  Procedure Laterality Date  . LITHOTRIPSY  2002   FHx:    Reviewed / unchanged  SHx:    Reviewed / unchanged  Systems Review:  Constitutional: Denies fever, chills, wt changes, headaches, insomnia, fatigue, night sweats, change in appetite. Eyes: Denies redness, blurred vision, diplopia, discharge, itchy, watery eyes.  ENT: Denies discharge, congestion, post nasal drip, epistaxis, sore throat, earache, hearing loss, dental pain, tinnitus, vertigo, sinus pain, snoring.  CV: Denies chest pain, palpitations, irregular heartbeat, syncope, dyspnea, diaphoresis, orthopnea, PND, claudication or edema. Respiratory: denies cough, dyspnea, DOE, pleurisy, hoarseness, laryngitis, wheezing.  Gastrointestinal: Denies dysphagia, odynophagia, heartburn, reflux, water brash, abdominal pain  or cramps, nausea, vomiting, bloating, diarrhea, constipation, hematemesis, melena, hematochezia  or hemorrhoids. Genitourinary: Denies dysuria, frequency, urgency, nocturia, hesitancy, discharge, hematuria or flank pain. Musculoskeletal: Denies arthralgias, myalgias, stiffness, jt. swelling, pain, limping or strain/sprain.  Skin: Denies pruritus, rash, hives, warts, acne, eczema or change in skin lesion(s). Neuro: No weakness, tremor,  incoordination, spasms, paresthesia or pain. Psychiatric: Denies confusion, memory loss or sensory loss. Endo: Denies change in weight, skin or hair change.  Heme/Lymph: No excessive bleeding, bruising or enlarged lymph nodes.  Physical Exam  BP 118/72   Pulse 60   Temp 97.7 F (36.5 C)   Resp 16   Ht 5' 4.5" (1.638 m)   Wt 157 lb 9.6 oz (71.5 kg)   BMI 26.63 kg/m   Appears well nourished, well groomed  and in no distress.  Eyes: PERRLA, EOMs, conjunctiva no swelling or erythema. Sinuses: No frontal/maxillary tenderness ENT/Mouth: EAC's clear, TM's nl w/o erythema, bulging. Nares clear w/o erythema, swelling, exudates. Oropharynx clear without erythema or exudates. Oral hygiene is good. Tongue normal, non obstructing. Hearing intact.  Neck: Supple. Thyroid nl. Car 2+/2+ without bruits, nodes or JVD. Chest: Respirations nl with BS clear & equal w/o rales, rhonchi, wheezing or stridor.  Cor: Heart sounds normal w/ regular rate and rhythm without sig. murmurs, gallops, clicks or rubs. Peripheral pulses normal and equal  without edema.  Abdomen: Soft & bowel sounds normal. Non-tender w/o guarding, rebound, hernias, masses or organomegaly.  Lymphatics: Unremarkable.  Musculoskeletal: Full ROM all peripheral extremities, joint stability, 5/5 strength and normal gait.  Skin: Warm, dry without exposed rashes, lesions or ecchymosis apparent.  Neuro: Cranial nerves intact, reflexes equal bilaterally. Sensory-motor testing grossly intact. Tendon reflexes grossly intact.  Pysch: Alert & oriented x 3.  Insight and judgement nl & appropriate. No ideations.  Assessment and Plan:  1. Essential hypertension  - Continue medication, monitor blood pressure at home.  - Continue DASH diet. Reminder to go to the ER if any CP,  SOB, nausea, dizziness, severe HA, changes vision/speech.  - CBC with Differential/Platelet - BASIC METABOLIC PANEL WITH GFR - Magnesium - TSH  2. Hyperlipidemia,  mixed  - Continue diet/meds, exercise,& lifestyle modifications.  - Continue monitor periodic cholesterol/liver & renal functions   - Hepatic function panel - Lipid panel - TSH  3. T2_NIDDM  - Continue diet, exercise, lifestyle modifications.  - Monitor appropriate labs.  - encouraged to monitor CBG's. - Hemoglobin A1c - Insulin, random  4. Vitamin D deficiency  - Continue supplementation. - VITAMIN D 25 Hydroxy   5. Medication management  - CBC with Differential/Platelet - BASIC METABOLIC PANEL WITH GFR - Hepatic function panel - Magnesium - Lipid panel - TSH - Hemoglobin A1c - Insulin, random - VITAMIN D 25 Hydroxy        Discussed  regular exercise, BP monitoring, weight control to achieve/maintain BMI less than 25 and discussed med and SE's. Recommended labs to assess and monitor clinical status with further disposition pending results of labs. Over 30 minutes of exam, counseling, chart review was performed.

## 2016-08-11 LAB — MAGNESIUM: MAGNESIUM: 2 mg/dL (ref 1.5–2.5)

## 2016-08-11 LAB — TSH: TSH: 2.06 m[IU]/L (ref 0.40–4.50)

## 2016-08-11 LAB — HEMOGLOBIN A1C
Hgb A1c MFr Bld: 7.7 % — ABNORMAL HIGH (ref <5.7)
Mean Plasma Glucose: 174 mg/dL

## 2016-08-11 LAB — VITAMIN D 25 HYDROXY (VIT D DEFICIENCY, FRACTURES): Vit D, 25-Hydroxy: 25 ng/mL — ABNORMAL LOW (ref 30–100)

## 2016-08-13 LAB — INSULIN, RANDOM: INSULIN: 5.7 u[IU]/mL (ref 2.0–19.6)

## 2016-08-14 ENCOUNTER — Encounter: Payer: Self-pay | Admitting: *Deleted

## 2016-09-20 DIAGNOSIS — H11002 Unspecified pterygium of left eye: Secondary | ICD-10-CM | POA: Diagnosis not present

## 2016-09-20 DIAGNOSIS — H52212 Irregular astigmatism, left eye: Secondary | ICD-10-CM | POA: Diagnosis not present

## 2016-09-20 DIAGNOSIS — H11001 Unspecified pterygium of right eye: Secondary | ICD-10-CM | POA: Diagnosis not present

## 2016-09-22 HISTORY — PX: CATARACT EXTRACTION: SUR2

## 2016-10-04 DIAGNOSIS — H11002 Unspecified pterygium of left eye: Secondary | ICD-10-CM | POA: Diagnosis not present

## 2016-11-04 ENCOUNTER — Other Ambulatory Visit: Payer: Self-pay | Admitting: Internal Medicine

## 2016-11-04 DIAGNOSIS — E119 Type 2 diabetes mellitus without complications: Secondary | ICD-10-CM

## 2016-11-29 ENCOUNTER — Ambulatory Visit: Payer: Self-pay | Admitting: Physician Assistant

## 2016-12-25 ENCOUNTER — Ambulatory Visit: Payer: 59 | Admitting: Physician Assistant

## 2016-12-25 ENCOUNTER — Encounter: Payer: Self-pay | Admitting: Physician Assistant

## 2016-12-25 VITALS — BP 116/74 | HR 68 | Temp 97.2°F | Resp 16 | Ht 64.5 in | Wt 161.0 lb

## 2016-12-25 DIAGNOSIS — Z79899 Other long term (current) drug therapy: Secondary | ICD-10-CM | POA: Diagnosis not present

## 2016-12-25 DIAGNOSIS — E119 Type 2 diabetes mellitus without complications: Secondary | ICD-10-CM

## 2016-12-25 DIAGNOSIS — E782 Mixed hyperlipidemia: Secondary | ICD-10-CM

## 2016-12-25 DIAGNOSIS — I10 Essential (primary) hypertension: Secondary | ICD-10-CM

## 2016-12-25 NOTE — Patient Instructions (Signed)
Your A1C is a measure of your sugar over the past 3 months and is not affected by what you have eaten over the past few days. Diabetes increases your chances of stroke and heart attack over 300 % and is the leading cause of blindness and kidney failure in the [place]. Please make sure you decrease bad carbs like white bread, white rice, potatoes, corn, soft drinks, pasta, cereals, refined sugars, sweet tea, dried fruits, and fruit juice. Good carbs are okay to eat in moderation like sweet potatoes, brown rice, whole grain pasta/bread, most fruit (except dried fruit) and you can eat as many veggies as you want.   Greater than 6.5 is considered diabetic. Between 6.4 and 5.7 is prediabetic If your A1C is less than 5.7 you are NOT diabetic.  Targets for Glucose Readings: Time of Check Target for patients WITHOUT Diabetes Target for DIABETICS  Before Meals Less than 100  less than 150  Two hours after meals Less than 200  Less than 250      Bad carbs also include fruit juice, alcohol, and sweet tea. These are empty calories that do not signal to your brain that you are full.   Please remember the good carbs are still carbs which convert into sugar. So please measure them out no more than 1/2-1 cup of rice, oatmeal, pasta, and beans  Veggies are however free foods! Pile them on.   Not all fruit is created equal. Please see the list below, the fruit at the bottom is higher in sugars than the fruit at the top. Please avoid all dried fruits.      Control del nivel sanguneo de glucosa en los adultos (Blood Glucose Monitoring, Adult) El control del nivel de azcar (glucosa) en la sangre lo ayuda a tener la diabetes bajo control. Tambin ayuda a que usted y el mdico controlen si el tratamiento de la diabetes es Engineer, manufacturingeficaz. El control del nivel sanguneo de glucosa implica realizar controles regulares como lo indique el mdico y Midwifellevar registro de los resultados (registro diario). POR QU DEBO  CONTROLAR EL NIVEL SANGUNEO DE GLUCOSA? Si controla su nivel sanguneo de glucosa con regularidad, podr:  Comprender de United Stationersqu manera los alimentos, la actividad fsica, las enfermedades y los medicamentos inciden en los niveles sanguneos de Morgan Farmglucosa.  Conocer el nivel sanguneo de glucosa en cualquier momento dado. Saber rpidamente si el nivel es bajo (hipoglucemia) o alto (hiperglucemia).  Puede ser de ayuda para que usted y el mdico sepan cmo ajustar los medicamentos. CUNDO DEBO CONTROLAR EL NIVEL SANGUNEO DE GLUCOSA? Siga las indicaciones del mdico acerca de la frecuencia con la que debe controlar el nivel sanguneo de glucosa. La frecuencia puede depender de:  El tipo de diabetes que tenga.  Si su diabetes est bajo control.  Los medicamentos que toma. Si usted tiene diabetes tipo 1:  Controle su nivel sanguneo de glucosa al Rite Aidmenos dos veces al da.  Tambin controle su nivel sanguneo de glucosa: ? Antes de cada inyeccin de insulina. ? Antes y despus de hacer ejercicio. ? Entre las comidas. ? Dos horas despus de una comida. ? Ocasionalmente, entre las 2:00a.m. y las 3:00a.m., como se lo hayan indicado. ? Antes de Education officer, environmentalrealizar tareas peligrosas, Sport and exercise psychologistcomo manejar o usar maquinaria pesada. ? A la hora de acostarse.  Es posible que Chemical engineerdeba controlar con ms frecuencia los niveles sanguneos de [place] Groveglucosa, Pinardvillehasta 6 a 10veces por da: ? Si Botswanausa una bomba de Cragsmoorinsulina. ? Si necesita varias inyecciones diarias. ?  Si su diabetes no est bien controlada. ? Si est enfermo. ? Si tiene antecedentes de hipoglucemia grave. ? Si tiene antecedentes de no darse cuenta cundo est bajando su nivel sanguneo de glucosa (hipoglucemia asintomtica). Si usted tiene diabetes tipo 2:  Si recibe insulina u otro medicamento para la diabetes, controle el nivel sanguneo de glucosa al Borders Groupmenos dos veces al da.  Mientras reciba tratamiento intensivo con insulina, debe medirse el nivel sanguneo de  glucosa al menos 4veces al C.H. Robinson Worldwideda. Ocasionalmente, es posible que deba controlarse entre las 2:00a.m. y las 3:00a.m., segn se lo indiquen.  Tambin controle su nivel sanguneo de glucosa: ? Antes y despus de hacer ejercicio. ? Antes de Education officer, environmentalrealizar tareas peligrosas, Sport and exercise psychologistcomo manejar o usar maquinaria pesada.  Es posible que Chemical engineerdeba controlar con ms frecuencia los niveles sanguneos de glucosa si: ? Es necesario ajustar la dosis de sus medicamentos. ? Su diabetes no est bien controlada. ? Est enfermo. QU ES UN REGISTRO DIARIO DEL NIVEL Villa de SabanaSANGUNEO DE GLUCOSA?  Un registro diario es un registro de los valores de glucosa en la Britt[place]angre. Puede ayudarles a usted y a su mdico a: ? Conservator, museum/galleryBuscar patrones en su nivel sanguneo de glucosa durante el transcurso del Storytiempo. ? Ajustar su plan de control de la diabetes como sea necesario.  Cada vez que controle su nivel sanguneo de glucosa, anote el resultado y aquellos factores que pueden estar afectando su nivel sanguneo de glucosa, como la dieta y la actividad fsica Dietitianrealizada en el da.  La Harley-Davidsonmayora de los medidores de glucosa guardan un registro de las lecturas realizadas con el medidor. Algunos permiten descargar sus registros en una computadora.  CMO ME CONTROLO EL NIVEL SANGUNEO DE GLUCOSA? Siga los siguientes pasos para obtener lecturas precisas de su glucemia: Materiales necesarios  Medidor de Horticulturist, commercialglucosa en la sangre.  Tiras reactivas para el medidor. Cada medidor tiene sus propias tiras reactivas. Debe usar las tiras reactivas que trae su medidor.  Una aguja para pincharse el dedo (lanceta). No utilice la misma lanceta en ms de una ocasin.  Un dispositivo que sujeta la lanceta (dispositivo de puncin).  Un diario o libro de anotaciones para YRC Worldwideanotar los resultados. Procedimiento  BorgWarnerLvese las manos con agua y [place]jabn.  Pnchese el costado del dedo (no la punta) con Optometristla lanceta. Use un dedo diferente cada vez.  Frote suavemente el dedo General Millshasta que  aparezca una pequea gota de Rolandsangre.  Siga las instrucciones que vienen con el medidor para Public affairs consultantinsertar la tira Firefighterreactiva, Contractoraplicar la sangre sobre la tira y usar el medidor de Horticulturist, commercialglucosa en la sangre.  Registre el resultado y las observaciones que desee. Zonas del cuerpo alternativas para realizar las pruebas  Algunos medidores le permiten tomar sangre para la prueba de otras zonas del cuerpo que no son el dedo (zonas alternativas).  Si cree que tiene hipoglucemia o si tiene hipoglucemia asintomtica, no utilice las zonas alternativas del cuerpo. En su lugar, use los dedos.  Es posible que las zonas alternativas no sean tan precisas como los dedos porque el flujo de sangre es ms lento en esas zonas. Esto significa que el resultado que obtiene de estas zonas puede estar retrasado y ser un poco diferente del resultado que obtendra del dedo.  Los sitios alternativos ms comunes son los siguientes: ? Los antebrazos. ? Los muslos. ? La palma de Qwest Communicationsla mano. Consejos adicionales  Siempre tenga los insumos a mano.  Todos los medidores de glucosa incluyen un nmero de telfono "directo", disponible las 24  horas, al que podr llamar si tiene preguntas o necesita ayuda. Tambin puede consultar a su mdico.  Despus de usar algunas cajas de tiras reactivas, ajuste (calibre) el medidor de glucemia segn las instrucciones del medidor. Esta informacin no tiene Theme park manager el consejo del mdico. Asegrese de hacerle al mdico cualquier pregunta que tenga. Document Released: 01/08/2005 Document Revised: 05/02/2015 Document Reviewed: 06/20/2015 Elsevier Interactive Patient Education  2017 Elsevier Inc.  La diabetes mellitus y los alimentos (Diabetes Mellitus and Food) Es importante que controle su nivel de azcar en la sangre (glucosa). El nivel de glucosa en sangre depende en gran medida de lo que usted come. Comer alimentos saludables en las cantidades [place] a lo largo del Futures trader, aproximadamente a  la misma hora [employer], lo ayudar a Chief Operating Officer su nivel de Event organiser. Tambin puede ayudarlo a retrasar o Fish farm manager de la diabetes mellitus. Comer de [employer] saludable incluso puede ayudarlo a Event organiser de presin arterial y a Barista o Pharmacologist un peso saludable. Entre las recomendaciones generales para alimentarse y Water quality scientist los alimentos de forma saludable, se incluyen las siguientes:  Respetar las comidas principales y comer colaciones con regularidad. Evitar pasar largos perodos sin comer con el fin de perder peso.  Seguir una dieta que consista principalmente en alimentos de origen vegetal, como frutas, vegetales, frutos secos, legumbres y cereales integrales.  Utilizar mtodos de coccin a baja temperatura, como hornear, en lugar de mtodos de coccin a alta temperatura, como frer en abundante aceite. Trabaje con el nutricionista para aprender a Acupuncturist nutricional de las etiquetas de los alimentos. CMO PUEDEN AFECTARME LOS ALIMENTOS? Carbohidratos Los carbohidratos afectan el nivel de glucosa en sangre ms que cualquier otro tipo de alimento. El nutricionista lo ayudar a Chief Strategy Officer cuntos carbohidratos puede consumir en cada comida y ensearle a contarlos. El recuento de carbohidratos es importante para mantener la glucosa en sangre en un nivel saludable, en especial si utiliza insulina o toma determinados medicamentos para la diabetes mellitus. Alcohol El alcohol puede provocar disminuciones sbitas de la glucosa en sangre (hipoglucemia), en especial si utiliza insulina o toma determinados medicamentos para la diabetes mellitus. La hipoglucemia es una afeccin que puede poner en peligro la vida. Los sntomas de la hipoglucemia (somnolencia, mareos y Administrator) son similares a los sntomas de haber consumido mucho alcohol. Si el mdico lo autoriza a beber alcohol, hgalo con moderacin y siga estas pautas:  Las mujeres no deben beber  ms de un trago por da, y los hombres no deben beber ms de dos tragos por Futures trader. Un trago es igual a: ? 12 onzas (355 ml) de cerveza ? 5 onzas de vino (150 ml) de vino ? 1,5onzas (45ml) de bebidas espirituosas  No beba con el estmago vaco.  Mantngase hidratado. Beba agua, gaseosas dietticas o t helado sin azcar.  Las gaseosas comunes, los jugos y otros refrescos podran contener muchos carbohidratos y se Heritage manager. QU ALIMENTOS NO SE RECOMIENDAN? Cuando haga las elecciones de alimentos, es importante que recuerde que todos los alimentos son distintos. Algunos tienen menos nutrientes que otros por porcin, aunque podran tener la misma cantidad de caloras o carbohidratos. Es difcil darle al cuerpo lo que necesita cuando consume alimentos con menos nutrientes. Estos son algunos ejemplos de alimentos que debera evitar ya que contienen muchas caloras y carbohidratos, pero pocos nutrientes:  Neurosurgeon trans (la mayora de los alimentos procesados incluyen grasas trans en la etiqueta de Informacin nutricional).  Gaseosas comunes.  Jugos.  Caramelos.  Dulces, como tortas, pasteles, rosquillas y [place].  Comidas fritas. QU ALIMENTOS PUEDO COMER? Consuma alimentos ricos en nutrientes, que nutrirn el cuerpo y lo mantendrn saludable. Los alimentos que debe comer tambin dependern de varios factores, como:  Las caloras que necesita.  Los medicamentos que toma.  Su peso.  El nivel de glucosa en [place].  El Talmo de presin arterial.  El nivel de colesterol. Debe consumir una amplia variedad de alimentos, por ejemplo:  Protenas. ? Cortes de Target Corporation. ? Protenas con bajo contenido de grasas saturadas, como pescado, clara de huevo y frijoles. Evite las carnes procesadas.  Frutas y vegetales. ? [place] y Sports administrator que pueden ayudar a [employer] Corporation niveles sanguneos de [place], como [place], mangos y batatas.  Productos lcteos. ? Elija productos lcteos  sin grasa o con bajo contenido de [place], como [place], yogur y [place].  Cereales, panes, pastas y arroz. ? Elija cereales integrales, como panes multicereales, avena en grano y arroz integral. Estos alimentos pueden ayudar a controlar la presin arterial.  Rosalin Hawking. ? Alimentos que contengan grasas saludables, como frutos secos, Chartered certified accountant, aceite de [place], aceite de canola y pescado. TODOS LOS QUE PADECEN DIABETES MELLITUS TIENEN EL MISMO PLAN DE COMIDAS? Dado que todas las personas que padecen diabetes mellitus son distintas, no hay un solo plan de comidas que funcione para todos. Es muy importante que se rena con un nutricionista que lo ayudar a crear un plan de comidas adecuado para usted. Esta informacin no tiene Theme park manager el consejo del mdico. Asegrese de hacerle al mdico cualquier pregunta que tenga. Document Released: 04/17/2007 Document Revised: 01/29/2014 Document Reviewed: 12/05/2012 Elsevier Interactive Patient Education  2017 [employer].

## 2016-12-25 NOTE — Progress Notes (Signed)
Assessment and Plan:   Diabetes mellitus without complication [facility](HCC) Discussed general issues about diabetes pathophysiology and management., Educational material distributed., Suggested low cholesterol diet., Encouraged aerobic exercise., Discussed foot care., Reminded to get yearly retinal exam. GIVEN METER TO CHECK HIS SUGARS AND INSTRUCTED HOW TO DO IN THE OFFICE WILL STOP/DECREASE ORANGE JUICE  Essential hypertension - continue medications, DASH diet, exercise and monitor at home. Call if greater than 130/80.  -     CBC with Differential/Platelet -     BASIC METABOLIC PANEL WITH GFR -     Hepatic function panel -     TSH  Mixed hyperlipidemia -continue medications, check lipids, decrease fatty foods, increase activity.  - ONLY ON 1/2 PILL 2 DAYS A WEEK, NOT 3 -     Lipid panel  Medication management -     Magnesium   Continue diet and meds as discussed. Further disposition pending results of labs. Future Appointments  Date Time Provider Department Center  03/05/2017 10:00 AM Lucky CowboyMcKeown, William, MD [facility] None    HPI 51 y.o. male  presents for 3 month follow up  has a past medical history of Diabetes mellitus without complication (HCC), Hyperlipidemia, and Vitamin D deficiency.  Has had cough x 2 weeks, no fever chills, with yellow mucus, has taken robtussi  His blood pressure has been controlled at home, today their BP is BP: 116/74  He does not workout, WANTS TO START AGAIN. He denies chest pain, shortness of breath, dizziness.  He is on cholesterol medication, TAKES 1 PILL TWO DAYS A WEEK and denies myalgias. His cholesterol is not at goal less than 70. The cholesterol last visit was:   Lab Results  Component Value Date   CHOL 182 08/10/2016   HDL 42 08/10/2016   LDLCALC 108 (H) 08/10/2016   TRIG 162 (H) 08/10/2016   CHOLHDL 4.3 08/10/2016    He has been working on diet and exercise for diabetes on glipizide 1 pill twice daily and metformin on 2 pills once daily, and  denies paresthesia of the feet, polydipsia, polyuria and visual disturbances. He is on bASA, he is not on ACE  Last A1C in the office was:  Lab Results  Component Value Date   HGBA1C 7.7 (H) 08/10/2016   Patient is on Vitamin D supplement.   Lab Results  Component Value Date   VD25OH 25 (L) 08/10/2016     BMI is Body mass index is 27.21 kg/m., he is working on diet and exercise. Wt Readings from Last 3 Encounters:  12/25/16 161 lb (73 kg)  08/10/16 157 lb 9.6 oz (71.5 kg)  05/31/16 162 lb 9.6 oz (73.8 kg)    Current Medications:  Current Outpatient Medications on File Prior to Visit  Medication Sig Dispense Refill  . Cholecalciferol (VITAMIN D3) 5000 units TABS Take 5,000 Units by mouth daily.    Marland Kitchen. glipiZIDE (GLUCOTROL) 5 MG tablet TAKE ONE-HALF TO ONE TABLET BY MOUTH THREE TIMES DAILY BEFORE  MEALS 270 tablet 1  . atorvastatin (LIPITOR) 80 MG tablet Take 1/2 to 1 tablet daily or as directed 30 tablet 5  . metFORMIN (GLUCOPHAGE XR) 500 MG 24 hr tablet Take 2 tablets twice daily with meals 360 tablet 11   No current facility-administered medications on file prior to visit.    Medical History:  Past Medical History:  Diagnosis Date  . Diabetes mellitus without complication (HCC)   . Hyperlipidemia   . Vitamin D deficiency    Allergies:  Allergies  Allergen Reactions  . Poison Ivy Extract [Poison Ivy Extract] Rash    Review of Systems:  Review of Systems  Constitutional: Negative.  Negative for chills and fever.  HENT: Negative for congestion, ear discharge, ear pain, hearing loss, nosebleeds, sinus pain, sore throat and tinnitus.   Eyes: Negative.   Respiratory: Negative for cough, hemoptysis, sputum production, shortness of breath, wheezing and stridor.   Cardiovascular: Negative.   Gastrointestinal: Negative.   Genitourinary: Negative.   Musculoskeletal: Negative for back pain, falls, joint pain, myalgias and neck pain.  Skin: Negative.   Neurological: Negative.    Endo/Heme/Allergies: Negative.   Psychiatric/Behavioral: Negative.     Family history- Review and unchanged Social history- Review and unchanged Physical Exam: BP 116/74   Pulse 68   Temp (!) 97.2 F (36.2 C)   Resp 16   Ht 5' 4.5" (1.638 m)   Wt 161 lb (73 kg)   SpO2 97%   BMI 27.21 kg/m  Wt Readings from Last 3 Encounters:  12/25/16 161 lb (73 kg)  08/10/16 157 lb 9.6 oz (71.5 kg)  05/31/16 162 lb 9.6 oz (73.8 kg)   General Appearance: Well nourished, in no apparent distress. Eyes: PERRLA, EOMs, conjunctiva no swelling or erythema Sinuses: No Frontal/maxillary tenderness ENT/Mouth: Ext aud canals clear, TMs without erythema, bulging. No erythema, swelling, or exudate on post pharynx.  Tonsils not swollen or erythematous. Hearing normal.  Neck: Supple, thyroid normal.  Respiratory: Respiratory effort normal, BS equal bilaterally without rales, rhonchi, wheezing or stridor.  Cardio: RRR with no MRGs. Brisk peripheral pulses without edema.  Abdomen: Soft, + BS,  Non tender, no guarding, rebound, hernias, masses. Lymphatics: Non tender without lymphadenopathy.  Musculoskeletal: Full ROM, 5/5 strength, Normal gait Skin: Warm, dry without rashes, lesions, ecchymosis.  Neuro: Cranial nerves intact. Normal muscle tone, no cerebellar symptoms. Psych: Awake and oriented X 3, normal affect, Insight and Judgment appropriate.    Quentin MullingAmanda Shraddha Lebron, PA-C 10:56 AM [facility]

## 2016-12-26 LAB — BASIC METABOLIC PANEL WITH GFR
BUN: 12 mg/dL (ref 7–25)
CALCIUM: 9.4 mg/dL (ref 8.6–10.3)
CO2: 26 mmol/L (ref 20–32)
CREATININE: 0.72 mg/dL (ref 0.60–1.35)
Chloride: 98 mmol/L (ref 98–110)
GFR, EST NON AFRICAN AMERICAN: 110 mL/min/{1.73_m2} (ref >=60)
GFR, Est African American: 127 mL/min/{1.73_m2} (ref >=60)
GLUCOSE: 131 mg/dL — AB (ref 65–99)
Potassium: 4.1 mmol/L (ref 3.5–5.3)
SODIUM: 137 mmol/L (ref 135–146)

## 2016-12-26 LAB — CBC WITH DIFFERENTIAL/PLATELET
Basophils Absolute: 70 cells/uL (ref 0–200)
Basophils Relative: 1.1 %
EOS PCT: 1.4 %
Eosinophils Absolute: 90 cells/uL (ref 15–500)
HEMATOCRIT: 49.6 % (ref 38.5–50.0)
HEMOGLOBIN: 17.2 g/dL — AB (ref 13.2–17.1)
LYMPHS ABS: 1798 {cells}/uL (ref 850–3900)
MCH: 30.3 pg (ref 27.0–33.0)
MCHC: 34.7 g/dL (ref 32.0–36.0)
MCV: 87.5 fL (ref 80.0–100.0)
MONOS PCT: 7.2 %
MPV: 12.5 fL (ref 7.5–12.5)
NEUTROS ABS: 3981 {cells}/uL (ref 1500–7800)
Neutrophils Relative %: 62.2 %
Platelets: 209 10*3/uL (ref 140–400)
RBC: 5.67 10*6/uL (ref 4.20–5.80)
RDW: 13.3 % (ref 11.0–15.0)
Total Lymphocyte: 28.1 %
WBC mixed population: 461 cells/uL (ref 200–950)
WBC: 6.4 10*3/uL (ref 3.8–10.8)

## 2016-12-26 LAB — HEMOGLOBIN A1C
HEMOGLOBIN A1C: 7.6 %{Hb} — AB (ref <5.7)
Mean Plasma Glucose: 171 (calc)
eAG (mmol/L): 9.5 (calc)

## 2016-12-26 LAB — LIPID PANEL
Cholesterol: 173 mg/dL (ref <200)
HDL: 52 mg/dL (ref >40)
LDL CHOLESTEROL (CALC): 94 mg/dL
NON-HDL CHOLESTEROL (CALC): 121 mg/dL (ref <130)
TRIGLYCERIDES: 161 mg/dL — AB (ref <150)
Total CHOL/HDL Ratio: 3.3 (calc) (ref <5.0)

## 2016-12-26 LAB — MAGNESIUM: Magnesium: 2.2 mg/dL (ref 1.5–2.5)

## 2016-12-26 LAB — HEPATIC FUNCTION PANEL
AG RATIO: 1.5 (calc) (ref 1.0–2.5)
ALBUMIN MSPROF: 4.7 g/dL (ref 3.6–5.1)
ALKALINE PHOSPHATASE (APISO): 83 U/L (ref 40–115)
ALT: 33 U/L (ref 9–46)
AST: 21 U/L (ref 10–40)
BILIRUBIN TOTAL: 0.7 mg/dL (ref 0.2–1.2)
Bilirubin, Direct: 0.2 mg/dL (ref 0.0–0.2)
GLOBULIN: 3.2 g/dL (ref 1.9–3.7)
Indirect Bilirubin: 0.5 mg/dL (calc) (ref 0.2–1.2)
TOTAL PROTEIN: 7.9 g/dL (ref 6.1–8.1)

## 2016-12-26 LAB — SPECIMEN COMPROMISED

## 2016-12-26 LAB — TSH: TSH: 1.95 m[IU]/L (ref 0.40–4.50)

## 2016-12-26 LAB — FACTOR 10 ASSAY

## 2016-12-26 NOTE — Progress Notes (Signed)
LVM for return call on lab results

## 2017-01-03 NOTE — Progress Notes (Signed)
LVM Lab results were printed out for the pt along with a letter to explain the results.

## 2017-02-17 ENCOUNTER — Other Ambulatory Visit: Payer: Self-pay | Admitting: Physician Assistant

## 2017-03-05 ENCOUNTER — Encounter: Payer: Self-pay | Admitting: Internal Medicine

## 2017-03-28 ENCOUNTER — Encounter: Payer: Self-pay | Admitting: Internal Medicine

## 2017-03-28 NOTE — Patient Instructions (Signed)

## 2017-03-28 NOTE — Progress Notes (Signed)
[facility]   Victor Mcintyre, M.D.     Dyanne CarrelAmanda R. Steffanie Dunnollier, P.A.-C Judd GaudierAshley Corbett, DNP [facility]                [address]                [place], [place] [id number] Telephone [phone] Telefax [phone] Annual  Screening/Preventative Visit  & Comprehensive Evaluation & Examination     This very nice 52 y.o. married Victor Mcintyre Man presents for a Screening/Preventative Visit & comprehensive evaluation and management of multiple medical co-morbidities.  Patient has been followed for labile HTN, HLD, T2_DM  and Vitamin D Deficiency.     BP is followed expectantly.  Patient's BP has been controlled at home.  Today's BP is at goal - 120/72. Patient denies any cardiac symptoms as chest pain, palpitations, shortness of breath, dizziness or ankle swelling.     Patient's hyperlipidemia is not controlled with diet and sporadic use of lipid medications. Patient denies myalgias or other medication SE's. Last lipids were not at goal: Lab Results  Component Value Date   CHOL 173 12/25/2016   HDL 52 12/25/2016   LDLCALC 108 (H) 08/10/2016   TRIG 161 (H) 12/25/2016   CHOLHDL 3.3 12/25/2016      Patient has T2_NIDDM (A1c 6.7%/2013)  and patient denies reactive hypoglycemic symptoms, visual blurring, diabetic polys or paresthesias. Last A1c was not at goal: Lab Results  Component Value Date   HGBA1C 7.6 (H) 12/25/2016       Finally, patient has history of Vitamin D Deficiency ("24"/2013, "21"/2015 and "24"/2016) and last Vitamin D was still very low : Lab Results  Component Value Date   VD25OH 25 (L) 08/10/2016   Current Outpatient Medications on File Prior to Visit  Medication Sig  . VITAMIN D 5000 units  Take 1 daily  - Not taking  . glipiZIDE 5 MG tablet TAKE 1/2 to 1 tab  3 x/ DAILY BEFORE  MEALS  . metFORMIN-XR 500 MG 2 TAKE T2 tabs 2 x / day  WITH MEALS  . atorvastatin  80 MG tablet Take 1/2 - 1 tablet daily     Allergies  Allergen Reactions  . Poison Ivy Extract [Poison Ivy Extract] Rash   Past Medical History:  Diagnosis Date  . Diabetes mellitus without complication (HCC)   . Hyperlipidemia   . Vitamin D deficiency    Health Maintenance  Topic Date Due  . INFLUENZA VACCINE  08/22/2016  . PNEUMOCOCCAL POLYSACCHARIDE VACCINE (2) 12/10/2016  . COLONOSCOPY  01/24/2017  . URINE MICROALBUMIN  01/31/2017  . OPHTHALMOLOGY EXAM  05/29/2017  . HEMOGLOBIN A1C  06/25/2017  . FOOT EXAM  03/29/2018  . TETANUS/TDAP  12/10/2021  . HIV Screening  Completed   Immunization History  Administered Date(s) Administered  . Influenza Split 12/29/2013  . Influenza, Seasonal, Injecte, Preservative Fre 11/11/2015  . PPD Test 12/29/2012, 12/29/2013, 01/26/2015, 02/01/2016, 03/29/2017  . Pneumococcal Polysaccharide-23 12/11/2011, 03/29/2017  . Tdap 12/11/2011   Last Colon - never   Past Surgical History:  Procedure Laterality Date  . CATARACT EXTRACTION Left 09/2016  . LITHOTRIPSY  2002   Family History  Problem Relation Age of Onset  . Cancer Mother   . Diabetes Father    Socioeconomic History  . Marital status: Married - wife resides in [place] - teaches elementary school and she visits in the summer breaks    Spouse name: Not on file  Occupational History  . 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 Smoking status: Never Smoker  . Smokeless tobacco: Never Used  Substance and Sexual Activity  . Alcohol use: Yes    Comment: rarely drinks  . Drug use: No  . Sexual activity: Not on file    ROS Constitutional: Denies fever, chills, weight loss/gain, headaches, insomnia,  night sweats or change in appetite. Does c/o fatigue. Eyes: Denies redness, blurred vision, diplopia, discharge, itchy or watery eyes.  ENT: Denies discharge, congestion, post nasal drip, epistaxis, sore throat, earache, hearing loss, dental pain, Tinnitus, Vertigo, Sinus pain or  snoring.  Cardio: Denies chest pain, palpitations, irregular heartbeat, syncope, dyspnea, diaphoresis, orthopnea, PND, claudication or edema Respiratory: denies cough, dyspnea, DOE, pleurisy, hoarseness, laryngitis or wheezing.  Gastrointestinal: Denies dysphagia, heartburn, reflux, water brash, pain, cramps, nausea, vomiting, bloating, diarrhea, constipation, hematemesis, melena, hematochezia, jaundice or hemorrhoids Genitourinary: Denies dysuria, frequency, urgency, nocturia, hesitancy, discharge, hematuria or flank pain Musculoskeletal: Denies arthralgia, myalgia, stiffness, Jt. Swelling, pain, limp or strain/sprain. Denies Falls. Skin: Denies puritis, rash, hives, warts, acne, eczema or change in skin lesion Neuro: No weakness, tremor, incoordination, spasms, paresthesia or pain Psychiatric: Denies confusion, memory loss or sensory loss. Denies Depression. Endocrine: Denies change in weight, skin, hair change, nocturia, and paresthesia, diabetic polys, visual blurring or hyper / hypo glycemic episodes.  Heme/Lymph: No excessive bleeding, bruising or enlarged lymph nodes.  Physical Exam  BP 120/72   Pulse 72   Temp 97.7 F (36.5 C)   Resp 16   Ht 5' 4.75" (1.645 m)   Wt 159 lb 6.4 oz (72.3 kg)   BMI 26.73 kg/m   General Appearance: Well nourished and well groomed and in no apparent distress.  Eyes: PERRLA, EOMs, conjunctiva no swelling or erythema, normal fundi and vessels. Sinuses: No frontal/maxillary tenderness ENT/Mouth: EACs patent / TMs  nl. Nares clear without erythema, swelling, mucoid exudates. Oral hygiene is good. No erythema, swelling, or exudate. Tongue normal, non-obstructing. Tonsils not swollen or erythematous. Hearing normal.  Neck: Supple, thyroid not palpable. No bruits, nodes or JVD. Respiratory: Respiratory effort normal.  BS equal and clear bilateral without rales, rhonci, wheezing or stridor. Cardio: Heart sounds are normal with regular rate and rhythm and no  murmurs, rubs or gallops. Peripheral pulses are normal and equal bilaterally without edema. No aortic or femoral bruits. Chest: symmetric with normal excursions and percussion.  Abdomen: Soft, with Nl bowel sounds. Nontender, no guarding, rebound, hernias, masses, or organomegaly.  Lymphatics: Non tender without lymphadenopathy.  Genitourinary: No hernias.Testes nl. DRE - prostate nl for age - smooth & firm w/o nodules. Musculoskeletal: Full ROM all peripheral extremities, joint stability, 5/5 strength, and normal gait. Skin: Warm and dry without rashes, lesions, cyanosis, clubbing or  ecchymosis.  Neuro: Cranial nerves intact, reflexes equal bilaterally. Normal muscle tone, no cerebellar symptoms.  Sensation intact to touch, vibratory and Monofilament to the toes bilaterally. Pysch: Alert and oriented X 3 with normal affect, insight and judgment appropriate.   Assessment and Plan  1. Annual Preventative/Screening Exam   2. Labile hypertension  - EKG 12-Lead - [place], RETROPERITNL ABD,  LTD - Urinalysis, Routine w reflex microscopic - Microalbumin / creatinine urine ratio - CBC with Differential/Platelet - BASIC METABOLIC PANEL WITH GFR - Magnesium - TSH  3. Hyperlipidemia, mixed  - EKG 12-Lead - [place], RETROPERITNL ABD,  LTD - Hepatic function panel - Lipid panel  4. T2_NIDDM  - EKG 12-Lead -  [place], RETROPERITNL ABD,  LTD - Urinalysis, Routine w reflex microscopic - Microalbumin / creatinine urine ratio - HM DIABETES FOOT EXAM - LOW EXTREMITY NEUR EXAM DOCUM - Hemoglobin A1c - Insulin, random  5. Vitamin D deficiency  - VITAMIN D 25 Hydroxyl  6. Screening for colorectal cancer  - POC Hemoccult Bld/Stl   7. Prostate cancer screening  - PSA  8. Screening examination for pulmonary tuberculosis   9. Screening for ischemic heart disease  - EKG 12-Lead  10. Screening for AAA (aortic abdominal aneurysm)  - [place], RETROPERITNL ABD,  LTD  11. Fatigue  -  Iron,Total/Total Iron Binding Cap - Vitamin B12 - Testosterone  12. Medication management  - Urinalysis, Routine w reflex microscopic - Microalbumin / creatinine urine ratio - CBC with Differential/Platelet - BASIC METABOLIC PANEL WITH GFR - Hepatic function panel - Magnesium - Lipid panel - TSH - Hemoglobin A1c - Insulin, random - VITAMIN D 25 Hydroxyl         Patient was counseled in prudent diet, weight control to achieve/maintain BMI less than 25, BP monitoring, regular exercise and medications as discussed.  Discussed med effects and SE's. Routine screening labs and tests as requested with regular follow-up as recommended. Over 40 minutes of exam, counseling, chart review and high complex critical decision making was performed

## 2017-03-29 ENCOUNTER — Encounter: Payer: Self-pay | Admitting: Internal Medicine

## 2017-03-29 ENCOUNTER — Ambulatory Visit: Payer: 59 | Admitting: Internal Medicine

## 2017-03-29 VITALS — BP 120/72 | HR 72 | Temp 97.7°F | Resp 16 | Ht 64.75 in | Wt 159.4 lb

## 2017-03-29 DIAGNOSIS — Z23 Encounter for immunization: Secondary | ICD-10-CM | POA: Diagnosis not present

## 2017-03-29 DIAGNOSIS — Z1211 Encounter for screening for malignant neoplasm of colon: Secondary | ICD-10-CM

## 2017-03-29 DIAGNOSIS — R0989 Other specified symptoms and signs involving the circulatory and respiratory systems: Secondary | ICD-10-CM

## 2017-03-29 DIAGNOSIS — E119 Type 2 diabetes mellitus without complications: Secondary | ICD-10-CM | POA: Diagnosis not present

## 2017-03-29 DIAGNOSIS — Z79899 Other long term (current) drug therapy: Secondary | ICD-10-CM

## 2017-03-29 DIAGNOSIS — Z0000 Encounter for general adult medical examination without abnormal findings: Secondary | ICD-10-CM | POA: Diagnosis not present

## 2017-03-29 DIAGNOSIS — E559 Vitamin D deficiency, unspecified: Secondary | ICD-10-CM

## 2017-03-29 DIAGNOSIS — Z111 Encounter for screening for respiratory tuberculosis: Secondary | ICD-10-CM

## 2017-03-29 DIAGNOSIS — I10 Essential (primary) hypertension: Secondary | ICD-10-CM

## 2017-03-29 DIAGNOSIS — Z125 Encounter for screening for malignant neoplasm of prostate: Secondary | ICD-10-CM | POA: Diagnosis not present

## 2017-03-29 DIAGNOSIS — E782 Mixed hyperlipidemia: Secondary | ICD-10-CM

## 2017-03-29 DIAGNOSIS — Z136 Encounter for screening for cardiovascular disorders: Secondary | ICD-10-CM | POA: Diagnosis not present

## 2017-03-29 DIAGNOSIS — R5383 Other fatigue: Secondary | ICD-10-CM

## 2017-03-29 DIAGNOSIS — Z1212 Encounter for screening for malignant neoplasm of rectum: Secondary | ICD-10-CM

## 2017-03-29 DIAGNOSIS — Z0001 Encounter for general adult medical examination with abnormal findings: Secondary | ICD-10-CM

## 2017-04-01 LAB — CBC WITH DIFFERENTIAL/PLATELET
BASOS ABS: 62 {cells}/uL (ref 0–200)
Basophils Relative: 1.1 %
EOS ABS: 101 {cells}/uL (ref 15–500)
Eosinophils Relative: 1.8 %
HCT: 47.1 % (ref 38.5–50.0)
Hemoglobin: 16.2 g/dL (ref 13.2–17.1)
Lymphs Abs: 1473 cells/uL (ref 850–3900)
MCH: 30.6 pg (ref 27.0–33.0)
MCHC: 34.4 g/dL (ref 32.0–36.0)
MCV: 88.9 fL (ref 80.0–100.0)
MONOS PCT: 5.8 %
MPV: 12.5 fL (ref 7.5–12.5)
NEUTROS PCT: 65 %
Neutro Abs: 3640 cells/uL (ref 1500–7800)
PLATELETS: 196 10*3/uL (ref 140–400)
RBC: 5.3 10*6/uL (ref 4.20–5.80)
RDW: 12.8 % (ref 11.0–15.0)
TOTAL LYMPHOCYTE: 26.3 %
WBC: 5.6 10*3/uL (ref 3.8–10.8)
WBCMIX: 325 {cells}/uL (ref 200–950)

## 2017-04-01 LAB — BASIC METABOLIC PANEL WITH GFR
BUN: 13 mg/dL (ref 7–25)
CHLORIDE: 102 mmol/L (ref 98–110)
CO2: 27 mmol/L (ref 20–32)
CREATININE: 0.81 mg/dL (ref 0.70–1.33)
Calcium: 9.3 mg/dL (ref 8.6–10.3)
GFR, EST AFRICAN AMERICAN: 120 mL/min/{1.73_m2} (ref >=60)
GFR, Est Non African American: 104 mL/min/{1.73_m2} (ref >=60)
GLUCOSE: 281 mg/dL — AB (ref 65–99)
Potassium: 4 mmol/L (ref 3.5–5.3)
SODIUM: 136 mmol/L (ref 135–146)

## 2017-04-01 LAB — MAGNESIUM: Magnesium: 2 mg/dL (ref 1.5–2.5)

## 2017-04-01 LAB — TESTOSTERONE: TESTOSTERONE: 187 ng/dL — AB (ref 250–827)

## 2017-04-01 LAB — HEPATIC FUNCTION PANEL
AG RATIO: 1.7 (calc) (ref 1.0–2.5)
ALKALINE PHOSPHATASE (APISO): 75 U/L (ref 40–115)
ALT: 36 U/L (ref 9–46)
AST: 22 U/L (ref 10–35)
Albumin: 4.3 g/dL (ref 3.6–5.1)
BILIRUBIN DIRECT: 0.1 mg/dL (ref 0.0–0.2)
BILIRUBIN INDIRECT: 0.6 mg/dL (ref 0.2–1.2)
GLOBULIN: 2.5 g/dL (ref 1.9–3.7)
TOTAL PROTEIN: 6.8 g/dL (ref 6.1–8.1)
Total Bilirubin: 0.7 mg/dL (ref 0.2–1.2)

## 2017-04-01 LAB — LIPID PANEL
CHOL/HDL RATIO: 3.6 (calc) (ref <5.0)
CHOLESTEROL: 147 mg/dL (ref <200)
HDL: 41 mg/dL (ref >40)
LDL CHOLESTEROL (CALC): 73 mg/dL
Non-HDL Cholesterol (Calc): 106 mg/dL (calc) (ref <130)
Triglycerides: 238 mg/dL — ABNORMAL HIGH (ref <150)

## 2017-04-01 LAB — MICROALBUMIN / CREATININE URINE RATIO
Creatinine, Urine: 46 mg/dL (ref 20–320)
Microalb Creat Ratio: 11 mcg/mg creat (ref <30)
Microalb, Ur: 0.5 mg/dL

## 2017-04-01 LAB — URINALYSIS, ROUTINE W REFLEX MICROSCOPIC
Bilirubin Urine: NEGATIVE
HGB URINE DIPSTICK: NEGATIVE
KETONES UR: NEGATIVE
LEUKOCYTES UA: NEGATIVE
NITRITE: NEGATIVE
PROTEIN: NEGATIVE
Specific Gravity, Urine: 1.031 (ref 1.001–1.03)

## 2017-04-01 LAB — HEMOGLOBIN A1C
EAG (MMOL/L): 10.4 (calc)
HEMOGLOBIN A1C: 8.2 %{Hb} — AB (ref <5.7)
MEAN PLASMA GLUCOSE: 189 (calc)

## 2017-04-01 LAB — VITAMIN D 25 HYDROXY (VIT D DEFICIENCY, FRACTURES): Vit D, 25-Hydroxy: 22 ng/mL — ABNORMAL LOW (ref 30–100)

## 2017-04-01 LAB — PSA: PSA: 0.4 ng/mL (ref <=4.0)

## 2017-04-01 LAB — VITAMIN B12: Vitamin B-12: 784 pg/mL (ref 200–1100)

## 2017-04-01 LAB — IRON, TOTAL/TOTAL IRON BINDING CAP
%SAT: 45 % (ref 15–60)
IRON: 149 ug/dL (ref 50–180)
TIBC: 330 ug/dL (ref 250–425)

## 2017-04-01 LAB — TSH: TSH: 1.26 mIU/L (ref 0.40–4.50)

## 2017-04-01 LAB — INSULIN, RANDOM: INSULIN: 57.4 u[IU]/mL — AB (ref 2.0–19.6)

## 2017-04-08 ENCOUNTER — Encounter: Payer: Self-pay | Admitting: *Deleted

## 2017-04-11 DIAGNOSIS — Z1212 Encounter for screening for malignant neoplasm of rectum: Secondary | ICD-10-CM | POA: Diagnosis not present

## 2017-04-11 DIAGNOSIS — Z1211 Encounter for screening for malignant neoplasm of colon: Secondary | ICD-10-CM | POA: Diagnosis not present

## 2017-04-18 LAB — COLOGUARD: Cologuard: NEGATIVE

## 2017-04-22 ENCOUNTER — Encounter: Payer: Self-pay | Admitting: *Deleted

## 2017-04-30 ENCOUNTER — Ambulatory Visit: Payer: 59 | Admitting: Internal Medicine

## 2017-04-30 VITALS — BP 124/82 | HR 72 | Temp 97.3°F | Resp 16 | Ht 64.75 in | Wt 162.8 lb

## 2017-04-30 DIAGNOSIS — B3742 Candidal balanitis: Secondary | ICD-10-CM | POA: Diagnosis not present

## 2017-04-30 LAB — TB SKIN TEST
Induration: 0 mm
TB SKIN TEST: NEGATIVE

## 2017-04-30 MED ORDER — FLUCONAZOLE 150 MG PO TABS: ORAL_TABLET | ORAL | 1 refills | Status: DC

## 2017-04-30 NOTE — Progress Notes (Signed)
  Subjective:    Patient ID: Victor Mcintyre, male    DOB: 05/11/1965, 52 y.o.   MRN: [id number]  HPI  Patient presents with c/o itching & swelling of his foreskin & c/o dysuria  Medication Sig  . Cholecalciferol (VITAMIN D3) 5000 units TABS Take 5,000 Units by mouth daily.  Marland Kitchen. glipiZIDE (GLUCOTROL) 5 MG tablet TAKE ONE-HALF TO ONE TABLET BY MOUTH THREE TIMES DAILY BEFORE  MEALS  . metFORMIN (GLUCOPHAGE-XR) 500 MG 24 hr tablet TAKE TWO TABLETS BY MOUTH TWICE DAILY WITH MEALS  . atorvastatin (LIPITOR) 80 MG tablet Take 1/2 to 1 tablet daily or as directed   Allergies  Allergen Reactions  . Poison Ivy Extract [Poison Ivy Extract] Rash   Past Medical History:  Diagnosis Date  . Diabetes mellitus without complication (HCC)   . Hyperlipidemia   . Vitamin D deficiency    Review of Systems  10 point systems review negative except as above.    Objective:   Physical Exam   BP 124/82   Pulse 72   Temp (!) 97.3 F (36.3 C)   Resp 16   Ht 5' 4.75" (1.645 m)   Wt 162 lb 12.8 oz (73.8 kg)   BMI 27.30 kg/m   GU exam - finds prepuce retracts and appears sl swollen with a monilial discharge. No ulcerations.    Assessment & Plan:   1. Candidal balanitis  - Recommended OTC anti fungal cream to apply 2-3 x/ day   - fluconazole (DIFLUCAN) 150 MG tablet; Take 1 tablet  2 x / week on Tuesday & Fridays for Yeast infection  Dispense: 8 tablet; Refill: 1  - Discussed hygiene to keep foreskin retracted

## 2017-06-20 DIAGNOSIS — E119 Type 2 diabetes mellitus without complications: Secondary | ICD-10-CM | POA: Diagnosis not present

## 2017-06-20 DIAGNOSIS — H1045 Other chronic allergic conjunctivitis: Secondary | ICD-10-CM | POA: Diagnosis not present

## 2017-06-20 DIAGNOSIS — H11003 Unspecified pterygium of eye, bilateral: Secondary | ICD-10-CM | POA: Diagnosis not present

## 2017-06-28 IMAGING — CR DG ABDOMEN 1V
2 series · 2 of 2 positions shown · non-contrast
Comparison: None

CLINICAL DATA: Kidney stone, history diabetes mellitus

EXAM:
ABDOMEN - 1 VIEW

[abdomen supine (1 of 2)]
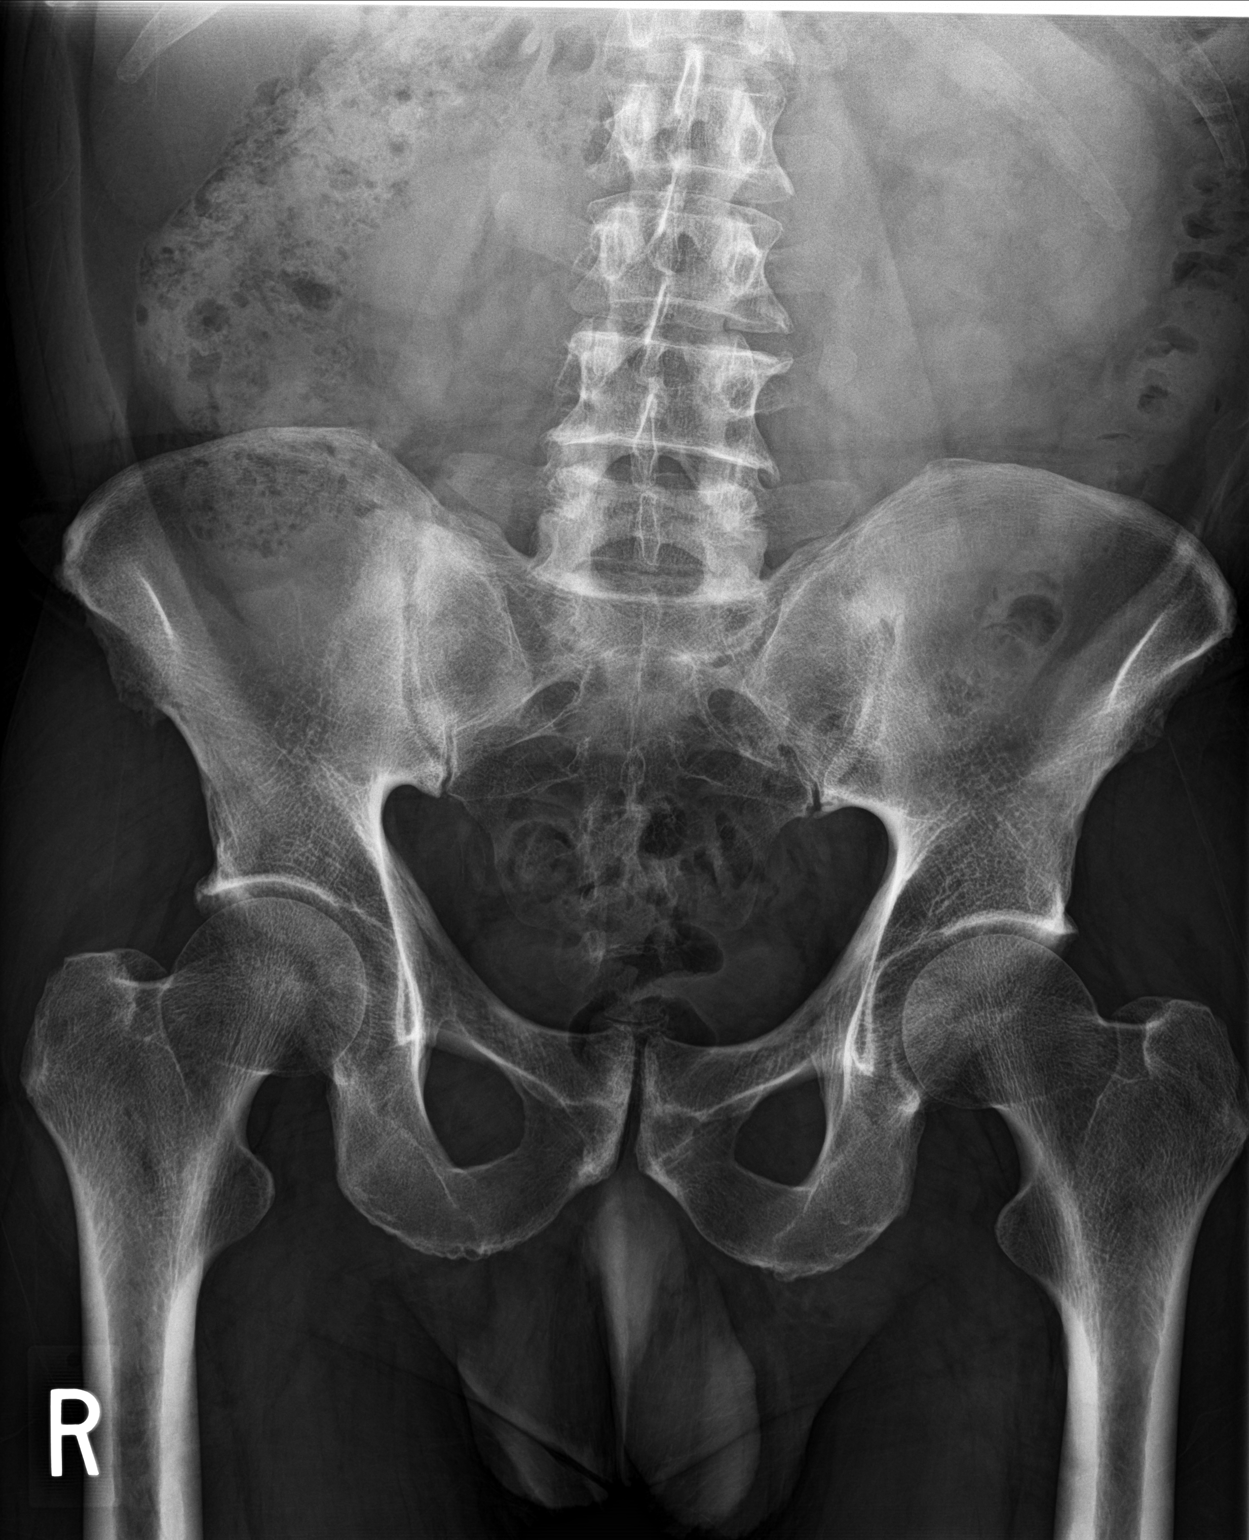

[abdomen supine (2 of 2)]
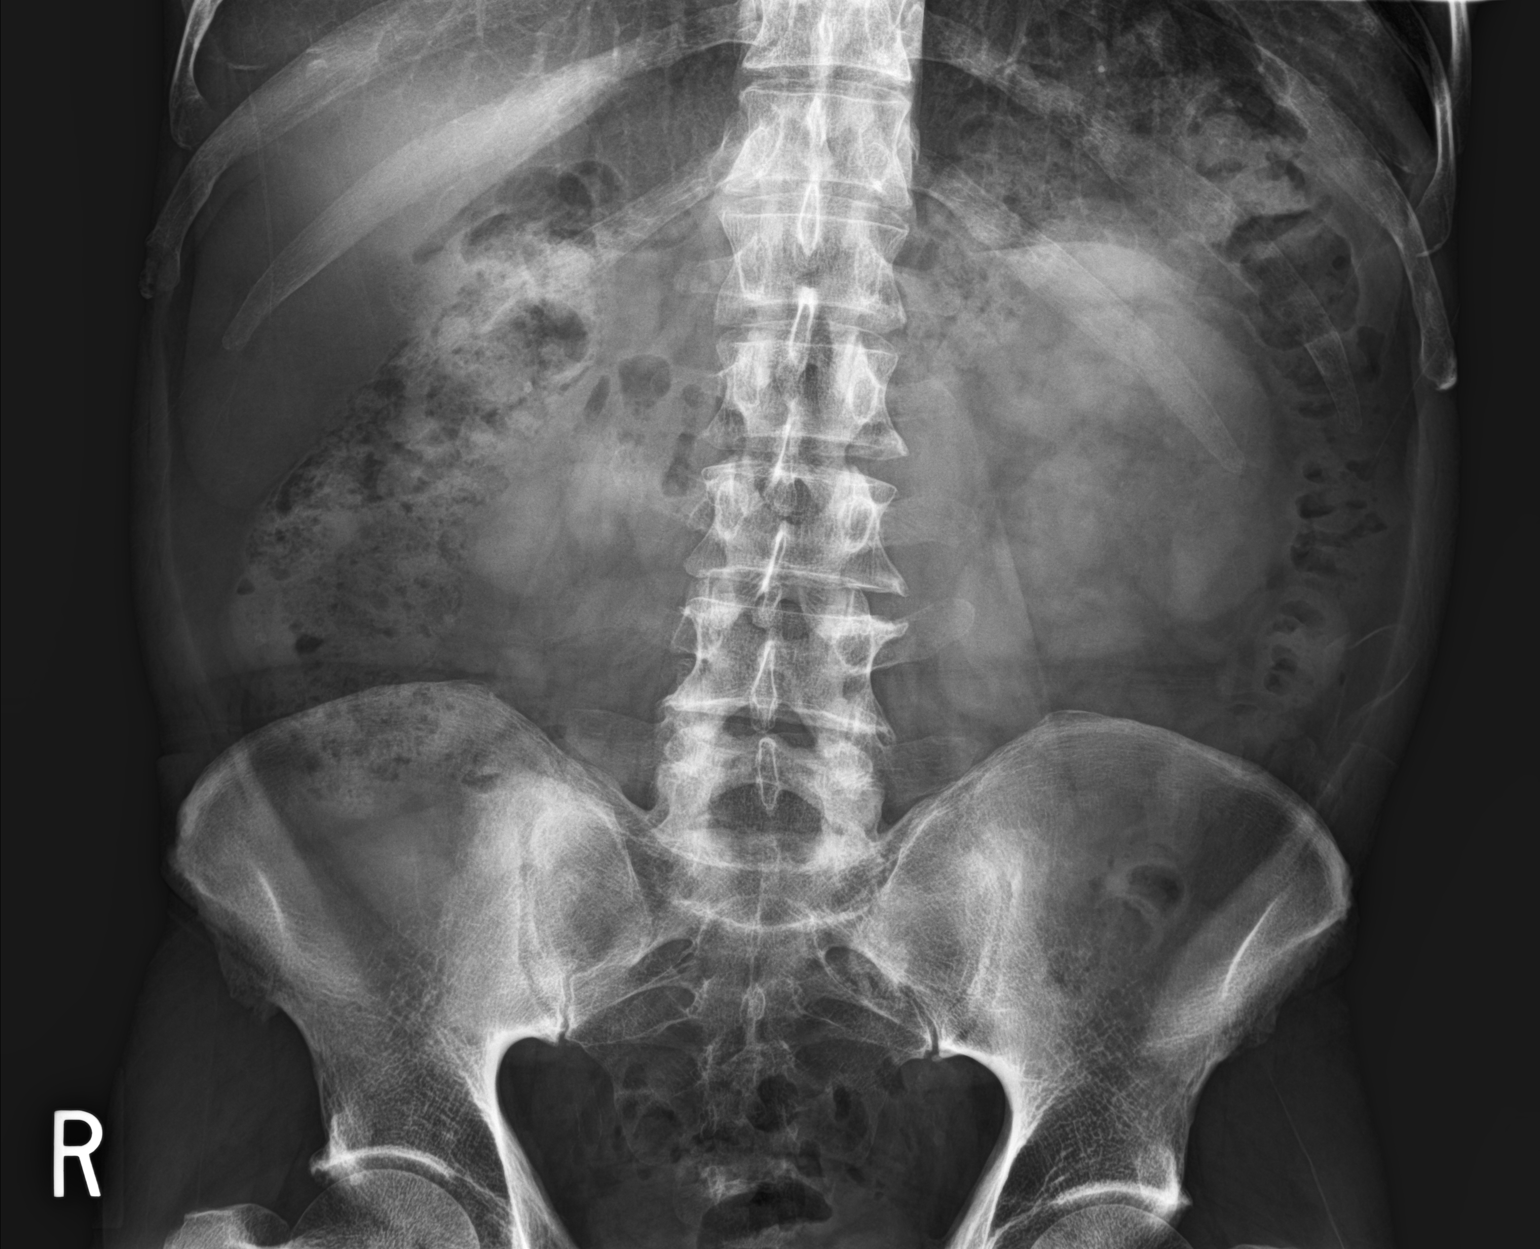

[2 of 2 positions shown; findings below may reference images not displayed]

FINDINGS: Questionable 3 mm diameter nonobstructing calculus versus bowel
artifact projecting over RIGHT kidney.

No additional urinary tract calcification.

Bowel gas pattern normal.

Bones demineralized with areas of sclerosis at both SI joints
greater on RIGHT.

Tiny scattered lumbar spine endplate spurs.
IMPRESSION: Questionable 3 mm nonobstructing RIGHT renal calculus versus bowel
artifact.

Suspected asymmetric sacroiliitis greater on RIGHT.

## 2017-07-02 DIAGNOSIS — Z6826 Body mass index (BMI) 26.0-26.9, adult: Secondary | ICD-10-CM | POA: Insufficient documentation

## 2017-07-02 DIAGNOSIS — E663 Overweight: Secondary | ICD-10-CM

## 2017-07-02 NOTE — Progress Notes (Signed)
FOLLOW UP  Assessment and Plan:   Hypertension Controlled by lifestyle Monitor blood pressure at home; patient to call if consistently greater than 130/80 Continue DASH diet.   Reminder to go to the ER if any CP, SOB, nausea, dizziness, severe HA, changes vision/speech, left arm numbness and tingling and jaw pain.  Cholesterol Currently at goal; continue atorvastatin 80 mg twice weekly - has been out of medication, refilled, defer dose adjustments, emphasized he should call if out of medications to have refilled Continue low cholesterol diet and exercise.  Check lipid panel.   Diabetes with diabetic chronic kidney disease Continue medication: increase metformin to 4 tabs daily, continue glipizide  Continue diet and exercise.  Perform daily foot/skin check, notify office of any concerning changes.  Check A1C  Obesity with co morbidities Long discussion about weight loss, diet, and exercise Recommended diet heavy in fruits and veggies and low in animal meats, cheeses, and dairy products, appropriate calorie intake Discussed ideal weight for height  Will follow up in 3 months  Vitamin D Def At goal at last visit; continue supplementation to maintain goal of 70-100 Defer Vit D level  Right lateral epicondylitis Start NSAIDS, RICE, and exercise given, can get brace If not better with refer to orthopedics.    Continue diet and meds as discussed. Further disposition pending results of labs. Discussed med's effects and SE's.   Over 30 minutes of exam, counseling, chart review, and critical decision making was performed.   Future Appointments  Date Time Provider Department Center  07/03/2017  4:00 PM Judd Gaudierorbett, Kimble Delaurentis, NP [facility] None  10/02/2017  4:00 PM Lucky CowboyMcKeown, William, MD [facility] None  04/25/2018 10:00 AM Lucky CowboyMcKeown, William, MD [facility] None    ----------------------------------------------------------------------------------------------------------------------  HPI 52  y.o. male  presents for 3 month follow up on hypertension, cholesterol, diabetes, weight and vitamin D deficiency.   BMI is Body mass index is 27.17 kg/m., he has been working on diet and exercise; he has cut out white bread, soda, switched to whole wheat/whole grains. He eats fresh fruit (oranges). He uses splenda for sweetener. He tries to run at least twice a week.  Wt Readings from Last 3 Encounters:  07/03/17 162 lb (73.5 kg)  04/30/17 162 lb 12.8 oz (73.8 kg)  03/29/17 159 lb 6.4 oz (72.3 kg)   His blood pressure has been controlled at home, today their BP is BP: 120/84  He does workout. He denies chest pain, shortness of breath, dizziness.   He is on cholesterol medication (atorvastatin 80 mg twice weekly on Tuesday and Thursday) and denies myalgias. He admits he has been out of medication for 3 weeks. His cholesterol is at goal. The cholesterol last visit was:   Lab Results  Component Value Date   CHOL 147 03/29/2017   HDL 41 03/29/2017   LDLCALC 73 03/29/2017   TRIG 238 (H) 03/29/2017   CHOLHDL 3.6 03/29/2017    He has been working on diet and exercise for T2 diabetes, and denies foot ulcerations, increased appetite, nausea, paresthesia of the feet, polydipsia, polyuria, visual disturbances, vomiting and weight loss. He checks a fasting glucose intermittently and reports runs 120-130s with rare outliers. He is currently taking metformin 500 mg BID only, does take glipizide 5 mg BID. Last A1C in the office was:  Lab Results  Component Value Date   HGBA1C 8.2 (H) 03/29/2017   Patient is on Vitamin D supplement.   Lab Results  Component Value Date   VD25OH 22 (L) 03/29/2017  Current Medications:  Current Outpatient Medications on File Prior to Visit  Medication Sig  . atorvastatin (LIPITOR) 80 MG tablet Take 1/2 to 1 tablet daily or as directed  . Cholecalciferol (VITAMIN D3) 5000 units TABS Take 5,000 Units by mouth daily.  Marland Kitchen glipiZIDE (GLUCOTROL) 5 MG tablet TAKE  ONE-HALF TO ONE TABLET BY MOUTH THREE TIMES DAILY BEFORE  MEALS  . metFORMIN (GLUCOPHAGE-XR) 500 MG 24 hr tablet TAKE TWO TABLETS BY MOUTH TWICE DAILY WITH MEALS  . fluconazole (DIFLUCAN) 150 MG tablet Take 1 tablet  2 x / week on Tuesday & Fridays for Yeast infection   No current facility-administered medications on file prior to visit.      Allergies:  Allergies  Allergen Reactions  . Poison Ivy Extract [Poison Ivy Extract] Rash     Medical History:  Past Medical History:  Diagnosis Date  . Diabetes mellitus without complication (HCC)   . Hyperlipidemia   . Vitamin D deficiency    Family history- Reviewed and unchanged Social history- Reviewed and unchanged   Review of Systems:  Review of Systems  Constitutional: Negative for malaise/fatigue and weight loss.  HENT: Negative for hearing loss and tinnitus.   Eyes: Negative for blurred vision and double vision.  Respiratory: Negative for cough, shortness of breath and wheezing.   Cardiovascular: Negative for chest pain, palpitations, orthopnea, claudication and leg swelling.  Gastrointestinal: Negative for abdominal pain, blood in stool, constipation, diarrhea, heartburn, melena, nausea and vomiting.  Genitourinary: Negative.   Musculoskeletal: Positive for joint pain (right lateral epicondyle x 3 weeks). Negative for myalgias.  Skin: Negative for rash.  Neurological: Negative for dizziness, tingling, sensory change, weakness and headaches.  Endo/Heme/Allergies: Negative for polydipsia.  Psychiatric/Behavioral: Negative.   All other systems reviewed and are negative.     Physical Exam: BP 120/84   Pulse 80   Temp (!) 97.3 F (36.3 C)   Ht 5' 4.75" (1.645 m)   Wt 162 lb (73.5 kg)   SpO2 98%   BMI 27.17 kg/m  Wt Readings from Last 3 Encounters:  07/03/17 162 lb (73.5 kg)  04/30/17 162 lb 12.8 oz (73.8 kg)  03/29/17 159 lb 6.4 oz (72.3 kg)   General Appearance: Well nourished, in no apparent distress. Eyes:  PERRLA, EOMs, conjunctiva no swelling or erythema Sinuses: No Frontal/maxillary tenderness ENT/Mouth: Ext aud canals clear, TMs without erythema, bulging. No erythema, swelling, or exudate on post pharynx.  Tonsils not swollen or erythematous. Hearing normal.  Neck: Supple, thyroid normal.  Respiratory: Respiratory effort normal, BS equal bilaterally without rales, rhonchi, wheezing or stridor.  Cardio: RRR with no MRGs. Brisk peripheral pulses without edema.  Abdomen: Soft, + BS.  Non tender, no guarding, rebound, hernias, masses. Lymphatics: Non tender without lymphadenopathy.  Musculoskeletal: Full ROM, 5/5 strength, Normal gait. Right lateral epicondyle tenderness with palpation and rotation of forearm.  Skin: Warm, dry without rashes, lesions, ecchymosis.  Neuro: Cranial nerves intact. No cerebellar symptoms.  Psych: Awake and oriented X 3, normal affect, Insight and Judgment appropriate.    Dan Maker, NP 3:53 PM [facility]

## 2017-07-03 ENCOUNTER — Ambulatory Visit: Payer: 59 | Admitting: Adult Health

## 2017-07-03 ENCOUNTER — Encounter: Payer: Self-pay | Admitting: Adult Health

## 2017-07-03 VITALS — BP 120/84 | HR 80 | Temp 97.3°F | Ht 64.75 in | Wt 162.0 lb

## 2017-07-03 DIAGNOSIS — Z79899 Other long term (current) drug therapy: Secondary | ICD-10-CM

## 2017-07-03 DIAGNOSIS — E782 Mixed hyperlipidemia: Secondary | ICD-10-CM | POA: Diagnosis not present

## 2017-07-03 DIAGNOSIS — E119 Type 2 diabetes mellitus without complications: Secondary | ICD-10-CM | POA: Diagnosis not present

## 2017-07-03 DIAGNOSIS — E559 Vitamin D deficiency, unspecified: Secondary | ICD-10-CM

## 2017-07-03 DIAGNOSIS — M7711 Lateral epicondylitis, right elbow: Secondary | ICD-10-CM

## 2017-07-03 DIAGNOSIS — R0989 Other specified symptoms and signs involving the circulatory and respiratory systems: Secondary | ICD-10-CM

## 2017-07-03 DIAGNOSIS — E663 Overweight: Secondary | ICD-10-CM | POA: Diagnosis not present

## 2017-07-03 MED ORDER — METFORMIN HCL ER 500 MG PO TB24: ORAL_TABLET | ORAL | 1 refills | Status: DC

## 2017-07-03 MED ORDER — ATORVASTATIN CALCIUM 80 MG PO TABS: ORAL_TABLET | ORAL | 1 refills | Status: DC

## 2017-07-03 NOTE — Patient Instructions (Addendum)
Can take ibuprofen or aleve regularly for 1-2 weeks for elbow pain; rest; avoid painful movements, can apply ice at night  You can get a tennis elbow brace/band at a pharmacy (can ask a pharmacist if you are not sure) and wear this while at work - may help with pain  Try stevia for better sweetener   Increase metformin to 4 tabs a day - take with meals -   Codo de [place] (Tennis Elbow) El codo de [place] (epicondilitis lateral) es la inflamacin de los tendones externos del antebrazo cerca del codo. Los tendones [employer] con los [place]. Los tendones externos del antebrazo intervienen en la extensin de la [place] y su punto de unin se encuentra en la parte externa del codo. A menudo, el codo de [place] aparece en aquellos que juegan al tenis, pero cualquier persona puede tener la afeccin si extiende la mueca o gira el antebrazo repetidas veces. CAUSAS La causa de la afeccin es la extensin de la [place] y el uso de las manos de [place] reiterada. Puede ser consecuencia de la prctica de deportes o la realizacin de tareas que exigen movimientos repetitivos del Product manager. El codo de [place] tambin puede deberse a una lesin. FACTORES DE RIESGO Corre un riesgo ms elevado de tener codo de tenista si juega al tenis o practica otro deporte con raqueta. Adems, si [place] frecuentemente las manos para trabajar. Es ms probable que Dietitian en:  Los msicos.  Los carpinteros, los pintores y los plomeros.  Los cocineros.  Los cajeros.  Los trabajadores de fbricas.  Los trabajadores de Paramedic.  Los carniceros.  Los usuarios de computadoras. SNTOMAS Los sntomas de esta afeccin incluyen lo siguiente:  Dolor y sensibilidad a la palpacin en el Product manager y la parte externa del codo. Delle Reining solo sienta dolor cuando use el brazo, o incluso cuando no lo haga.  Una sensacin de ardor que va desde el codo hasta el brazo.  Agarre dbil de las  manos. DIAGNSTICO Este trastorno se puede diagnosticar mediante la historia clnica y un examen fsico. Tambin pueden hacerle otros estudios, por ejemplo:  Radiografas.  Resonancia magntica. TRATAMIENTO El [employer] recomendar que haga cambios en el estilo de vida, por ejemplo, que ponga el brazo en reposo y se aplique hielo. El tratamiento tambin puede incluir lo siguiente:  Medicamentos para la inflamacin. Estos pueden incluir inyecciones de cortisona si el dolor contina.  Fisioterapia. Este tratamiento puede incluir masajes o ejercicios.  Un dispositivo ortopdico para el codo. En ltima instancia, se puede recomendar ciruga si el dolor no desaparece con el tratamiento. INSTRUCCIONES PARA EL CUIDADO EN EL HOGAR Actividad  Mantenga el codo y Warden/ranger en reposo como se lo haya indicado el mdico. Intente no realizar ninguna actividad que pueda haber causado el problema hasta que el mdico se lo permita.  Si un fisioterapeuta le ensea ejercicios, hgalos como se lo hayan indicado.  Si levanta un objeto, hgalo con la palma de la mano [place] arriba. Esto reduce el esfuerzo en el codo. Estilo de vida  Si el codo de [place] se debe a los deportes, revise el equipo y Manufacturing engineer de lo siguiente: ? Que lo [place] correctamente. ? Que es la opcin ms [place] para usted.  Si el codo de [place] se debe al Aleen Campi, tmese descansos con frecuencia, si es posible. Hable con el gerente sobre la mejor forma de Education officer, environmental las tareas de un modo que sea [place]. ? Si el  codo de Bulgariatenista se debe al uso de la computadora, hable con el gerente United Stationerssobre los cambios que pueden hacerse en su lugar de [place] Libertytrabajo. Instrucciones generales  Si se lo indican, aplique hielo sobre la zona dolorida: ? Nature conservation officeronga el hielo en una bolsa plstica. ? Coloque una FirstEnergy Corptoalla entre la piel y la bolsa de hielo. ? Coloque el hielo durante 20minutos, 2 a 3veces por da.  Tome los medicamentos solamente como se lo haya indicado  el mdico.  Si le indicaron que use un dispositivo ortopdico, selo como se lo haya indicado el mdico.  Concurra a todas las visitas de control como se lo haya indicado el mdico. Esto es importante. SOLICITE ATENCIN MDICA SI:  El dolor no mejora con Scientist, research (medical)el tratamiento.  El dolor [place] Cityempeora.  Tiene adormecimiento o debilidad en el antebrazo, la mano o los dedos de la [place] Citymano. Esta informacin no tiene Theme park managercomo fin reemplazar el consejo del mdico. Asegrese de hacerle al mdico cualquier pregunta que tenga. Document Released: 01/08/2005 Document Revised: 05/25/2014 Document Reviewed: 01/04/2014 Elsevier Interactive Patient Education  2018 [employer]    Codo de Bulgariatenista con rehabilitacin Tennis Elbow Rehab Consulte al mdico qu ejercicios son seguros para usted. Haga los ejercicios exactamente como se lo haya indicado el mdico y gradelos como se lo hayan indicado. Es normal sentir tirantez, tensin, presin o molestias leves mientras hace estos ejercicios, pero debe detenerse de inmediato si siente dolor repentino o si el dolor empeora. No comience a hacer estos ejercicios hasta que se lo indique el mdico. EJERCICIOS DE Pilgrim's PrideELONGACIN Y AMPLITUD DE MOVIMIENTOS Estos ejercicios calientan los msculos y las articulaciones, y mejoran la movilidad y la flexibilidad del codo. Estos ejercicios tambin ayudan a Engineer, materialsaliviar el dolor, el adormecimiento y el hormigueo. EjercicioA: Estiramiento del extensor de la mueca 1. Extienda el codo izquierdo/derecho con los dedos apuntando hacia abajo. 2. Tire suavemente la palma de la mano izquierda/derecha hacia usted hasta sentir un ligero estiramiento en la parte superior del antebrazo. 3. Para intensificar el estiramiento, lleve la mano izquierda/derecha hacia el lado del meique o el borde externo del Product managerantebrazo. 4. Mantenga esta posicin durante ___________ segundos. Repita __________ veces. Realice este ejercicio __________ veces al da. Si se lo indic el  mdico, repita el estiramiento, pero esta vez con el codo flexionado. EjercicioB: Estiramiento del flexor de la mueca 1. Extienda el codo izquierdo/derecho y gire la palma [place] arriba. 2. Empuje suavemente con la punta de los dedos y la palma izquierda/derecha hacia atrs, de modo que la [place]ueca se extienda y los dedos apunten hacia el suelo. 3. Debe sentir un ligero estiramiento en la parte interior del antebrazo. 4. Mantenga esta posicin durante ___________ segundos. Repita __________ veces. Realice este ejercicio __________ veces al da. Si se lo indic el mdico, repita el estiramiento, pero esta vez con el codo flexionado. EJERCICIOS DE FORTALECIMIENTO Estos ejercicios fortalecen el codo y le otorgan resistencia. La resistencia es la capacidad de usar los msculos durante un tiempo prolongado, incluso despus de que se cansen. EjercicioC: Extensores de Warden/rangerla mueca 1. Sintese con el antebrazo izquierdo/derecho con la palma hacia abajo y completamente apoyado sobre una mesa o encimera. El codo debe estar en reposo y a la altura de los hombros. 2. Deje que la mueca izquierda/derecha se extienda sobre el borde de la superficie. 3. Sostenga sin apretar una pesa de __________ Cheron Schaumanno una banda de goma para ejercicios en la mano izquierda/derecha. Doble lentamente la mano izquierda/derecha hacia el antebrazo. Si est Liborio Nixonusando una  banda, sostngala en el lugar con la [place], para aumentar la resistencia. 4. Mantenga esta posicin durante ___________ segundos. 5. Vuelva lentamente a la posicin inicial. Repita __________ veces. Realice este ejercicio __________ veces al da. EjercicioD: Desviaciones radiales 1. Prese con una pesa de __________ en la mano izquierda/derecha. O bien, sintese sosteniendo una banda de goma para ejercicios con el otro brazo apoyado en una mesa o encimera. Ubique la mano de forma que el dedo pulgar [place]. 2. Phylliss Bob la mano hacia el frente de forma que el dedo pulgar  apunte hacia el antebrazo o tire hacia arriba la banda de goma. 3. Mantenga esta posicin durante ___________ segundos. 4. Vuelva lentamente a la posicin inicial. Repita __________ veces. Realice este ejercicio __________ veces al da. EjercicioE: Extensores de Warden/ranger, ejercicio excntrico 1. Sintese con el antebrazo izquierdo/derecho con la palma hacia abajo y completamente apoyado sobre una mesa o encimera. El codo debe estar en reposo y a la altura de los hombros. 2. Si el mdico se lo ha indicado, sostenga una pesa de __________ [employer]. 3. Deje que la mueca izquierda/derecha se extienda sobre el borde de la superficie. 4. Use la otra mano para subir la mano izquierda/derecha hacia el [place]. Mantenga el antebrazo en la mesa. 5. Use solo los msculos de la mano izquierda/derecha para bajar lentamente la mano a la posicin inicial. Repita __________ veces. Realice este ejercicio __________ veces al da. Esta informacin no tiene Theme park manager el consejo del mdico. Asegrese de hacerle al mdico cualquier pregunta que tenga. Document Released: 10/25/2005 Document Revised: 05/25/2014 Document Reviewed: 10/07/2014 Elsevier Interactive Patient Education  [employer].

## 2017-07-04 LAB — COMPLETE METABOLIC PANEL WITH GFR
AG Ratio: 1.7 (calc) (ref 1.0–2.5)
ALBUMIN MSPROF: 4.3 g/dL (ref 3.6–5.1)
ALT: 37 U/L (ref 9–46)
AST: 24 U/L (ref 10–35)
Alkaline phosphatase (APISO): 88 U/L (ref 40–115)
BILIRUBIN TOTAL: 0.3 mg/dL (ref 0.2–1.2)
BUN: 17 mg/dL (ref 7–25)
CALCIUM: 10 mg/dL (ref 8.6–10.3)
CO2: 26 mmol/L (ref 20–32)
Chloride: 104 mmol/L (ref 98–110)
Creat: 0.91 mg/dL (ref 0.70–1.33)
GFR, EST AFRICAN AMERICAN: 113 mL/min/{1.73_m2} (ref >=60)
GFR, EST NON AFRICAN AMERICAN: 98 mL/min/{1.73_m2} (ref >=60)
Globulin: 2.6 g/dL (calc) (ref 1.9–3.7)
Glucose, Bld: 79 mg/dL (ref 65–99)
Potassium: 4 mmol/L (ref 3.5–5.3)
Sodium: 137 mmol/L (ref 135–146)
TOTAL PROTEIN: 6.9 g/dL (ref 6.1–8.1)

## 2017-07-04 LAB — LIPID PANEL
CHOL/HDL RATIO: 6 (calc) — AB (ref <5.0)
Cholesterol: 211 mg/dL — ABNORMAL HIGH (ref <200)
HDL: 35 mg/dL — ABNORMAL LOW (ref >40)
NON-HDL CHOLESTEROL (CALC): 176 mg/dL — AB (ref <130)
TRIGLYCERIDES: 744 mg/dL — AB (ref <150)

## 2017-07-04 LAB — CBC WITH DIFFERENTIAL/PLATELET
BASOS PCT: 1.3 %
Basophils Absolute: 91 cells/uL (ref 0–200)
EOS ABS: 140 {cells}/uL (ref 15–500)
Eosinophils Relative: 2 %
HCT: 46.6 % (ref 38.5–50.0)
HEMOGLOBIN: 16.1 g/dL (ref 13.2–17.1)
Lymphs Abs: 2289 cells/uL (ref 850–3900)
MCH: 30.2 pg (ref 27.0–33.0)
MCHC: 34.5 g/dL (ref 32.0–36.0)
MCV: 87.4 fL (ref 80.0–100.0)
MPV: 13.6 fL — AB (ref 7.5–12.5)
Monocytes Relative: 7 %
NEUTROS ABS: 3990 {cells}/uL (ref 1500–7800)
Neutrophils Relative %: 57 %
PLATELETS: 191 10*3/uL (ref 140–400)
RBC: 5.33 10*6/uL (ref 4.20–5.80)
RDW: 13 % (ref 11.0–15.0)
TOTAL LYMPHOCYTE: 32.7 %
WBC mixed population: 490 cells/uL (ref 200–950)
WBC: 7 10*3/uL (ref 3.8–10.8)

## 2017-07-04 LAB — HEMOGLOBIN A1C
HEMOGLOBIN A1C: 8 %{Hb} — AB (ref <5.7)
Mean Plasma Glucose: 183 (calc)
eAG (mmol/L): 10.1 (calc)

## 2017-07-04 LAB — VITAMIN D 25 HYDROXY (VIT D DEFICIENCY, FRACTURES): Vit D, 25-Hydroxy: 20 ng/mL — ABNORMAL LOW (ref 30–100)

## 2017-07-04 LAB — TSH: TSH: 2.14 m[IU]/L (ref 0.40–4.50)

## 2017-07-16 ENCOUNTER — Encounter: Payer: Self-pay | Admitting: Internal Medicine

## 2017-07-16 ENCOUNTER — Ambulatory Visit: Payer: 59 | Admitting: Internal Medicine

## 2017-07-16 VITALS — BP 126/70 | HR 72 | Temp 97.6°F | Resp 16 | Ht 64.75 in | Wt 160.6 lb

## 2017-07-16 DIAGNOSIS — B3742 Candidal balanitis: Secondary | ICD-10-CM | POA: Diagnosis not present

## 2017-07-16 MED ORDER — FLUCONAZOLE 150 MG PO TABS: ORAL_TABLET | ORAL | 1 refills | Status: DC

## 2017-07-16 NOTE — Patient Instructions (Signed)
Skin Yeast Infection  Skin yeast infection is a condition in which there is an overgrowth of yeast (candida) that normally lives on the skin. This condition usually occurs in areas of the skin that are constantly warm and moist, such as the groin. What are the causes? This condition is caused by a change in the normal balance of the yeast and bacteria that live on the skin. What increases the risk? This condition is more likely to develop in:   People who are obese.  People who have diabetes.  People who take antibiotic medicines.  People who are malnourished.  People who have a weak defense (immune) system.  What are the signs or symptoms? Symptoms of this condition include:  A red, swollen area of the skin.  Bumps on the skin.  Itchiness.  How is this diagnosed? This condition is diagnosed with a medical history and physical exam. Your health care provider may check for yeast by taking light scrapings of the skin to be viewed under a microscope. How is this treated? This condition is treated with medicine. Medicines may be prescribed or be available over-the-counter. The medicines may be:  Taken by mouth (orally).  Applied as a cream.  Follow these instructions at home:  Take or apply over-the-counter and prescription medicines only as told by your health care provider.  Eat more yogurt. This may help to keep your yeast infection from returning.  Maintain a healthy weight. If you need help losing weight, talk with your health care provider.  Keep your skin clean and dry.  If you have diabetes, keep your blood sugar under control. Contact a health care provider if:  Your symptoms go away and then return.  Your symptoms do not get better with treatment.  Your symptoms get worse.  Your rash spreads.  You have a fever or chills.  You have new symptoms.  You have new warmth or redness of your skin.

## 2017-07-16 NOTE — Progress Notes (Signed)
  Subjective:    Patient ID: Victor Mcintyre, male    DOB: 01/05/1966, 52 y.o.   MRN: [id number]  HPI    This nice 52 yo married Victor Mcintyre was treated in April for a candidiasis of the glans penis and returns with a recurrence. He [place] in [place] and usually only visits in the month of Dec. He denies any other intimate contacts.  Medication Sig  . atorvastatin (LIPITOR) 80 MG tablet Take 1/2 to 1 tablet daily or as directed  . Cholecalciferol (VITAMIN D3) 5000 units TABS Take 5,000 Units by mouth daily.  Marland Kitchen. glipiZIDE (GLUCOTROL) 5 MG tablet TAKE ONE-HALF TO ONE TABLET BY MOUTH THREE TIMES DAILY BEFORE  MEALS  . metFORMIN (GLUCOPHAGE-XR) 500 MG 24 hr tablet TAKE TWO TABLETS BY MOUTH TWICE DAILY WITH MEALS   Allergies  Allergen Reactions  . Poison Ivy Extract [Poison Ivy Extract] Rash   Past Medical History:  Diagnosis Date  . Diabetes mellitus without complication (HCC)   . Hyperlipidemia   . Vitamin D deficiency    Past Surgical History:  Procedure Laterality Date  . CATARACT EXTRACTION Left 09/2016  . LITHOTRIPSY  2002     10 point systems review negative except as above.  Objective:   Physical Exam  BP 126/70   Pulse 72   Temp 97.6 F (36.4 C)   Resp 16   Ht 5' 4.75" (1.645 m)   Wt 160 lb 9.6 oz (72.8 kg)   BMI 26.93 kg/m   GU exam finds a redundant prepuce and when patient retracts there is a velvety pink confluent rash of the inner prepuce and glans with a  slight curdy discharge.   Assessment & Plan:   1. Candidal balanitis  - fluconazole (DIFLUCAN) 150 MG tablet; Take 1 tablet daily for 1st week, then 2 x/week on Mon & Thurs for next 3 weeks  Dispense: 13 tablet; Refill: 1  - also advised to continue his OTC generic lotrimin

## 2017-09-06 ENCOUNTER — Ambulatory Visit (INDEPENDENT_AMBULATORY_CARE_PROVIDER_SITE_OTHER): Payer: 59 | Admitting: Internal Medicine

## 2017-09-06 VITALS — BP 112/66 | HR 72 | Temp 97.3°F | Resp 16 | Ht 64.75 in | Wt 161.2 lb

## 2017-09-06 DIAGNOSIS — R300 Dysuria: Secondary | ICD-10-CM

## 2017-09-06 MED ORDER — DOXYCYCLINE HYCLATE 100 MG PO CAPS: ORAL_CAPSULE | ORAL | 0 refills | Status: DC

## 2017-09-06 NOTE — Progress Notes (Signed)
   Subjective:    Patient ID: Victor Mcintyre, male    DOB: 04/01/1965, 52 y.o.   MRN: [id number]  HPI   This very nice 52 yo married Victor Mcintyre Man presents with c/o dysuria. He has recently been treated x 2 for Candidal Balanitis. Denies any other LUTS and no discharge or discoloration of urine.   Medication Sig  . atorvastatin  80 MG  Take 1/2 to 1 tablet daily or as directed  . VITAMIN D 5000 units Take 5,000 Units by mouth daily.  Marland Kitchen. glipiZIDE  5 MG TAKE 1/2-1 TABLET  THREE TIMES DAILY BEFORE  MEALS  . metFORMIN-XR 500 MG  TAKE TWO TABLETS  TWICE DAILY WITH MEALS  . fluconazole  150 MG tablet Take 1 tablet daily for 1st week, then 2 x/week on Mon & Thurs for next 3 weeks    Allergies  Allergen Reactions  . Poison Ivy Extract [Poison Ivy Extract] Rash   Past Medical History:  Diagnosis Date  . Diabetes mellitus without complication (HCC)   . Hyperlipidemia   . Vitamin D deficiency    Past Surgical History:  Procedure Laterality Date  . CATARACT EXTRACTION Left 09/2016  . LITHOTRIPSY  2002   Review of Systems    Objective:   Physical Exam  BP 112/66   Pulse 72   Temp (!) 97.3 F (36.3 C)   Resp 16   Ht 5' 4.75" (1.645 m)   Wt 161 lb 3.2 oz (73.1 kg)   BMI 27.03 kg/m   HEENT - WNL. Neck - supple.  Chest - Clear equal BS. Cor - Nl HS. RRR w/o sig m.  Abd - Neg. GU - No hernia. Scrotal contents unremarkable.  Glans appears unremarkable.  MS- FROM w/o deformities.  Gait Nl. Neuro -  Nl w/o focal abnormalities.    Assessment & Plan:   1. Dysuria  - Urinalysis, Routine w reflex microscopic - Urine Culture  - doxycycline 100 MG capsule; Take 1 capsule 2 x /day with food for 5 days, then 1 x /day with food for 10 days  Dispense: 15 capsule; Refill: 0

## 2017-09-07 ENCOUNTER — Encounter: Payer: Self-pay | Admitting: Internal Medicine

## 2017-09-07 NOTE — Patient Instructions (Signed)
Urethritis   Urethritis is an inflammation of the tube through which urine exits  your bladder (urethra). What are the causes? Urethritis is often caused by an infection in your urethra. The infection can  be viral, like herpes. The infection can also be bacterial, like gonorrhea. What increases the risk? Risk factors of urethritis include:  Having sex without using a condom.  Having multiple sexual partners.  Having poor hygiene.  What are the signs or symptoms? Symptoms of urethritis are less noticeable in women than in men. These symptoms include:  Burning feeling when you urinate (dysuria).  Discharge from your urethra.  Blood in your urine (hematuria).  Urinating more than usual.  How is this diagnosed? To confirm a diagnosis of urethritis, your health care provider will do the following:  Ask about your sexual history.  Perform a physical exam.  Have you provide a sample of your urine for lab testing.  Use a cotton swab to gently collect a sample from your urethra for lab testing.  How is this treated? It is important to treat urethritis. Depending on the cause, untreated urethritis may lead to serious genital infections and possibly infertility. Urethritis caused by a bacterial infection is treated with antibiotic medicine. All sexual partners must be treated. Follow these instructions at home:  Do not have sex until the test results are known and treatment is completed, even if your symptoms go away before you finish treatment.  If you were prescribed an antibiotic, finish it all even if you start to feel better. Contact a health care provider if:  Your symptoms are not improved in 3 days.  Your symptoms are getting worse.  You develop abdominal pain or pelvic pain (in women).  You develop joint pain.  You have a fever. Get help right away if:  You have severe pain in the belly, back, or side.  You have repeated vomiting.

## 2017-09-09 ENCOUNTER — Other Ambulatory Visit: Payer: Self-pay | Admitting: Internal Medicine

## 2017-09-09 DIAGNOSIS — N342 Other urethritis: Secondary | ICD-10-CM

## 2017-09-09 LAB — URINALYSIS, ROUTINE W REFLEX MICROSCOPIC
BACTERIA UA: NONE SEEN /HPF
Bilirubin Urine: NEGATIVE
Hyaline Cast: NONE SEEN /LPF
KETONES UR: NEGATIVE
LEUKOCYTES UA: NEGATIVE
Nitrite: NEGATIVE
PROTEIN: NEGATIVE
SQUAMOUS EPITHELIAL / LPF: NONE SEEN /HPF (ref <=5)
Specific Gravity, Urine: 1.032 (ref 1.001–1.03)
pH: <=5 (ref 5.0–8.0)

## 2017-09-09 LAB — URINE CULTURE
MICRO NUMBER:: 90978463
SPECIMEN QUALITY:: ADEQUATE

## 2017-09-09 MED ORDER — AMOXICILLIN 250 MG PO CAPS
ORAL_CAPSULE | ORAL | 0 refills | Status: DC
Start: 2017-09-09 — End: 2017-10-02

## 2017-09-24 DIAGNOSIS — H11001 Unspecified pterygium of right eye: Secondary | ICD-10-CM | POA: Diagnosis not present

## 2017-10-02 ENCOUNTER — Encounter: Payer: Self-pay | Admitting: Internal Medicine

## 2017-10-02 ENCOUNTER — Ambulatory Visit: Payer: 59 | Admitting: Internal Medicine

## 2017-10-02 ENCOUNTER — Ambulatory Visit: Payer: Self-pay | Admitting: Internal Medicine

## 2017-10-02 VITALS — BP 122/80 | HR 68 | Temp 97.3°F | Resp 16 | Ht 64.75 in | Wt 157.8 lb

## 2017-10-02 DIAGNOSIS — R0989 Other specified symptoms and signs involving the circulatory and respiratory systems: Secondary | ICD-10-CM

## 2017-10-02 DIAGNOSIS — E782 Mixed hyperlipidemia: Secondary | ICD-10-CM | POA: Diagnosis not present

## 2017-10-02 DIAGNOSIS — E119 Type 2 diabetes mellitus without complications: Secondary | ICD-10-CM | POA: Diagnosis not present

## 2017-10-02 DIAGNOSIS — E559 Vitamin D deficiency, unspecified: Secondary | ICD-10-CM

## 2017-10-02 DIAGNOSIS — N200 Calculus of kidney: Secondary | ICD-10-CM

## 2017-10-02 DIAGNOSIS — Z79899 Other long term (current) drug therapy: Secondary | ICD-10-CM | POA: Diagnosis not present

## 2017-10-02 NOTE — Progress Notes (Signed)
This very nice 52 y.o. married [name] presents for 6 month follow up with HTN, HLD, T2_NIDDM and Vitamin D Deficiency.      Patient relates 2 episodes of  renal colic & passing stones  - May 2018 and Aug 2019.  He has collected 2 moderate sized kidney stones  ~ 1/8".     Patient is followed expectantly for labile HTN & BP has been controlled and today's BP is at goal - 122/80. Patient has had no complaints of any cardiac type chest pain, palpitations, dyspnea / orthopnea / PND, dizziness, claudication, or dependent edema.     Hyperlipidemia is not controlled with diet & sporadic use of chol meds. Patient denies myalgias or other med SE's. Last Lipids were not at goal with severe hypertriglyceridemia: Lab Results  Component Value Date   CHOL 211 (H) 07/03/2017   HDL 35 (L) 07/03/2017   LDLCALC not calculated. 07/03/2017   TRIG 744 (H) 07/03/2017   CHOLHDL 6.0 (H) 07/03/2017      Also, the patient has history of T2_NIDDM  (A1c 6.7%/2013) and has had no symptoms of reactive hypoglycemia, diabetic polys, paresthesias or visual blurring.  He reports infreq CBG's range up to 140 mg%.  Last A1c was not at goal: Lab Results  Component Value Date   HGBA1C 8.0 (H) 07/03/2017      Further, the patient also has history of Vitamin D Deficiency ("24"/2013, "21"/2015 and "24"/2016)  and supplements vitamin D without any suspected side-effects. Last vitamin D was still very low: Lab Results  Component Value Date   VD25OH 20 (L) 07/03/2017   Current Outpatient Medications on File Prior to Visit  Medication Sig  . atorvastatin (LIPITOR) 80 MG tablet Take 1/2 to 1 tablet daily or as directed  . Cholecalciferol (VITAMIN D3) 5000 units TABS Take 5,000 Units by mouth daily.  Marland Kitchen glipiZIDE (GLUCOTROL) 5 MG tablet TAKE ONE-HALF TO ONE TABLET BY MOUTH THREE TIMES DAILY BEFORE  MEALS  . metFORMIN (GLUCOPHAGE-XR) 500 MG 24 hr tablet TAKE TWO TABLETS BY MOUTH TWICE DAILY WITH MEALS   No current  facility-administered medications on file prior to visit.    Allergies  Allergen Reactions  . Poison Ivy Extract [Poison Ivy Extract] Rash   PMHx:   Past Medical History:  Diagnosis Date  . Diabetes mellitus without complication (HCC)   . Hyperlipidemia   . Vitamin D deficiency    Immunization History  Administered Date(s) Administered  . Influenza Split 12/29/2013  . Influenza, Seasonal, Injecte, Preservative Fre 11/11/2015  . PPD Test 12/29/2012, 12/29/2013, 01/26/2015, 02/01/2016, 03/29/2017  . Pneumococcal Polysaccharide-23 12/11/2011, 03/29/2017  . Tdap 12/11/2011   Past Surgical History:  Procedure Laterality Date  . CATARACT EXTRACTION Left 09/2016  . LITHOTRIPSY  2002   FHx:    Reviewed / unchanged  SHx:    Reviewed / unchanged   Systems Review:  Constitutional: Denies fever, chills, wt changes, headaches, insomnia, fatigue, night sweats, change in appetite. Eyes: Denies redness, blurred vision, diplopia, discharge, itchy, watery eyes.  ENT: Denies discharge, congestion, post nasal drip, epistaxis, sore throat, earache, hearing loss, dental pain, tinnitus, vertigo, sinus pain, snoring.  CV: Denies chest pain, palpitations, irregular heartbeat, syncope, dyspnea, diaphoresis, orthopnea, PND, claudication or edema. Respiratory: denies cough, dyspnea, DOE, pleurisy, hoarseness, laryngitis, wheezing.  Gastrointestinal: Denies dysphagia, odynophagia, heartburn, reflux, water brash, abdominal pain or cramps, nausea, vomiting, bloating, diarrhea, constipation, hematemesis, melena, hematochezia  or hemorrhoids. Genitourinary: Denies dysuria, frequency, urgency, nocturia,  hesitancy, discharge, hematuria or flank pain. Musculoskeletal: Denies arthralgias, myalgias, stiffness, jt. swelling, pain, limping or strain/sprain.  Skin: Denies pruritus, rash, hives, warts, acne, eczema or change in skin lesion(s). Neuro: No weakness, tremor, incoordination, spasms, paresthesia or  pain. Psychiatric: Denies confusion, memory loss or sensory loss. Endo: Denies change in weight, skin or hair change.  Heme/Lymph: No excessive bleeding, bruising or enlarged lymph nodes.  Physical Exam  BP 122/80   Pulse 68   Temp (!) 97.3 F (36.3 C)   Resp 16   Ht 5' 4.75" (1.645 m)   Wt 157 lb 12.8 oz (71.6 kg)   BMI 26.46 kg/m   Appears  well nourished, well groomed  and in no distress.  Eyes: PERRLA, EOMs, conjunctiva no swelling or erythema. Sinuses: No frontal/maxillary tenderness ENT/Mouth: EAC's clear, TM's nl w/o erythema, bulging. Nares clear w/o erythema, swelling, exudates. Oropharynx clear without erythema or exudates. Oral hygiene is good. Tongue normal, non obstructing. Hearing intact.  Neck: Supple. Thyroid not palpable. Car 2+/2+ without bruits, nodes or JVD. Chest: Respirations nl with BS clear & equal w/o rales, rhonchi, wheezing or stridor.  Cor: Heart sounds normal w/ regular rate and rhythm without sig. murmurs, gallops, clicks or rubs. Peripheral pulses normal and equal  without edema.  Abdomen: Soft & bowel sounds normal. Non-tender w/o guarding, rebound, hernias, masses or organomegaly.  Lymphatics: Unremarkable.  Musculoskeletal: Full ROM all peripheral extremities, joint stability, 5/5 strength and normal gait.  Skin: Warm, dry without exposed rashes, lesions or ecchymosis apparent.  Neuro: Cranial nerves intact, reflexes equal bilaterally. Sensory-motor testing grossly intact. Tendon reflexes grossly intact.  Pysch: Alert & oriented x 3.  Insight and judgement nl & appropriate. No ideations.  Assessment and Plan:  1. Labile hypertension  - Continue medication, monitor blood pressure at home.  - Continue DASH diet.  Reminder to go to the ER if any CP,  SOB, nausea, dizziness, severe HA, changes vision/speech.  - CBC with Differential/Platelet - COMPLETE METABOLIC PANEL WITH GFR - Magnesium - TSH  2. Hyperlipidemia, mixed  - Continue  diet/meds, exercise,& lifestyle modifications.  - Continue monitor periodic cholesterol/liver & renal functions   - Lipid panel - TSH  3. Diabetes mellitus without complication (HCC)  - Continue diet, exercise, lifestyle modifications.  - Monitor appropriate labs.  - Hemoglobin A1c - Insulin, random  4. Vitamin D deficiency  - Continue supplementation.   - VITAMIN D 25 Hydroxyl  5. Nephrolithiasis  - Ambulatory referral to Urology  6. Medication management  - CBC with Differential/Platelet - COMPLETE METABOLIC PANEL WITH GFR - Magnesium - Lipid panel - TSH - Hemoglobin A1c - Insulin, random - VITAMIN D 25 Hydroxyl      Discussed  regular exercise, BP monitoring, weight control to achieve/maintain BMI less than 25 and discussed med and SE's. Recommended labs to assess and monitor clinical status with further disposition pending results of labs. Over 30 minutes of exam, counseling, chart review was performed.

## 2017-10-02 NOTE — Patient Instructions (Signed)

## 2017-10-03 LAB — HEMOGLOBIN A1C
Hgb A1c MFr Bld: 9.2 % of total Hgb — ABNORMAL HIGH (ref <5.7)
MEAN PLASMA GLUCOSE: 217 (calc)
eAG (mmol/L): 12 (calc)

## 2017-10-03 LAB — CBC WITH DIFFERENTIAL/PLATELET
BASOS PCT: 1.1 %
Basophils Absolute: 59 cells/uL (ref 0–200)
EOS PCT: 1.8 %
Eosinophils Absolute: 97 cells/uL (ref 15–500)
HCT: 48.1 % (ref 38.5–50.0)
Hemoglobin: 16.5 g/dL (ref 13.2–17.1)
LYMPHS ABS: 1685 {cells}/uL (ref 850–3900)
MCH: 30 pg (ref 27.0–33.0)
MCHC: 34.3 g/dL (ref 32.0–36.0)
MCV: 87.5 fL (ref 80.0–100.0)
MPV: 12.8 fL — AB (ref 7.5–12.5)
Monocytes Relative: 9 %
NEUTROS PCT: 56.9 %
Neutro Abs: 3073 cells/uL (ref 1500–7800)
PLATELETS: 197 10*3/uL (ref 140–400)
RBC: 5.5 10*6/uL (ref 4.20–5.80)
RDW: 12.8 % (ref 11.0–15.0)
Total Lymphocyte: 31.2 %
WBC mixed population: 486 cells/uL (ref 200–950)
WBC: 5.4 10*3/uL (ref 3.8–10.8)

## 2017-10-03 LAB — LIPID PANEL
CHOL/HDL RATIO: 5.3 (calc) — AB (ref <5.0)
CHOLESTEROL: 212 mg/dL — AB (ref <200)
HDL: 40 mg/dL — ABNORMAL LOW (ref >40)
LDL Cholesterol (Calc): 118 mg/dL (calc) — ABNORMAL HIGH
NON-HDL CHOLESTEROL (CALC): 172 mg/dL — AB (ref <130)
Triglycerides: 381 mg/dL — ABNORMAL HIGH (ref <150)

## 2017-10-03 LAB — COMPLETE METABOLIC PANEL WITH GFR
AG Ratio: 1.6 (calc) (ref 1.0–2.5)
ALT: 29 U/L (ref 9–46)
AST: 21 U/L (ref 10–35)
Albumin: 4.4 g/dL (ref 3.6–5.1)
Alkaline phosphatase (APISO): 92 U/L (ref 40–115)
BUN: 12 mg/dL (ref 7–25)
CALCIUM: 9.8 mg/dL (ref 8.6–10.3)
CO2: 25 mmol/L (ref 20–32)
CREATININE: 1.08 mg/dL (ref 0.70–1.33)
Chloride: 99 mmol/L (ref 98–110)
GFR, EST AFRICAN AMERICAN: 92 mL/min/{1.73_m2} (ref >=60)
GFR, EST NON AFRICAN AMERICAN: 80 mL/min/{1.73_m2} (ref >=60)
Globulin: 2.7 g/dL (calc) (ref 1.9–3.7)
Glucose, Bld: 179 mg/dL — ABNORMAL HIGH (ref 65–99)
Potassium: 4.5 mmol/L (ref 3.5–5.3)
SODIUM: 136 mmol/L (ref 135–146)
TOTAL PROTEIN: 7.1 g/dL (ref 6.1–8.1)
Total Bilirubin: 0.7 mg/dL (ref 0.2–1.2)

## 2017-10-03 LAB — MAGNESIUM: Magnesium: 2 mg/dL (ref 1.5–2.5)

## 2017-10-03 LAB — INSULIN, RANDOM: INSULIN: 25.2 u[IU]/mL — AB (ref 2.0–19.6)

## 2017-10-03 LAB — VITAMIN D 25 HYDROXY (VIT D DEFICIENCY, FRACTURES): Vit D, 25-Hydroxy: 22 ng/mL — ABNORMAL LOW (ref 30–100)

## 2017-10-03 LAB — SPECIMEN COMPROMISED

## 2017-10-03 LAB — TSH: TSH: 1.91 m[IU]/L (ref 0.40–4.50)

## 2017-10-07 ENCOUNTER — Encounter: Payer: Self-pay | Admitting: *Deleted

## 2017-10-17 DIAGNOSIS — N200 Calculus of kidney: Secondary | ICD-10-CM | POA: Diagnosis not present

## 2017-10-22 DIAGNOSIS — N201 Calculus of ureter: Secondary | ICD-10-CM | POA: Diagnosis not present

## 2017-10-27 NOTE — Progress Notes (Deleted)
Assessment and Plan:   Diabetes mellitus without complication [facility]) Discussed general issues about diabetes pathophysiology and management., Educational material distributed., Suggested low cholesterol diet., Encouraged aerobic exercise., Discussed foot care., Reminded to get yearly retinal exam. GIVEN METER TO CHECK HIS SUGARS AND INSTRUCTED HOW TO DO IN THE OFFICE WILL STOP/DECREASE ORANGE JUICE  Essential hypertension - continue medications, DASH diet, exercise and monitor at home. Call if greater than 130/80.  -     CBC with Differential/Platelet -     BASIC METABOLIC PANEL WITH GFR -     Hepatic function panel -     TSH  Mixed hyperlipidemia -continue medications, check lipids, decrease fatty foods, increase activity.  - ONLY ON 1/2 PILL 2 DAYS A WEEK, NOT 3 -     Lipid panel    Continue diet and meds as discussed. Further disposition pending results of labs. Future Appointments  Date Time Provider Department Center  10/29/2017 11:30 AM Quentin Mulling, PA-C [facility] None  01/03/2018 11:00 AM Judd Gaudier, NP [facility] None  04/25/2018 10:00 AM [name], MD [facility] None    HPI 52 y.o. male  presents for DM follow up  has a past medical history of Diabetes mellitus without complication (HCC), Hyperlipidemia, and Vitamin D deficiency.    His blood pressure has been controlled at home, today their BP is    He does not workout. He denies chest pain, shortness of breath, dizziness.  He is on cholesterol medication, TAKES 1 PILL TWO DAYS A WEEK and denies myalgias. His cholesterol is not at goal less than 70. The cholesterol last visit was:   Lab Results  Component Value Date   CHOL 212 (H) 10/02/2017   HDL 40 (L) 10/02/2017   LDLCALC 118 (H) 10/02/2017   TRIG 381 (H) 10/02/2017   CHOLHDL 5.3 (H) 10/02/2017    He has been working on diet and exercise for diabetes with CKD stage 2 and hyperlipidemia on glipizide 1 pill twice daily and metformin on 2 pills once  daily, and denies paresthesia of the feet, polydipsia, polyuria and visual disturbances. He is on bASA, he is not on ACE  Last A1C in the office was:  Lab Results  Component Value Date   HGBA1C 9.2 (H) 10/02/2017     Lab Results  Component Value Date   GFRNONAA 80 10/02/2017   BMI is There is no height or weight on file to calculate BMI., he is working on diet and exercise. Wt Readings from Last 3 Encounters:  10/02/17 157 lb 12.8 oz (71.6 kg)  09/06/17 161 lb 3.2 oz (73.1 kg)  07/16/17 160 lb 9.6 oz (72.8 kg)    Current Medications:  Current Outpatient Medications on File Prior to Visit  Medication Sig Dispense Refill  . atorvastatin (LIPITOR) 80 MG tablet Take 1/2 to 1 tablet daily or as directed 90 tablet 1  . Cholecalciferol (VITAMIN D3) 5000 units TABS Take 5,000 Units by mouth daily.    Marland Kitchen glipiZIDE (GLUCOTROL) 5 MG tablet TAKE ONE-HALF TO ONE TABLET BY MOUTH THREE TIMES DAILY BEFORE  MEALS 270 tablet 1  . metFORMIN (GLUCOPHAGE-XR) 500 MG 24 hr tablet TAKE TWO TABLETS BY MOUTH TWICE DAILY WITH MEALS 360 tablet 1   No current facility-administered medications on file prior to visit.    Medical History:  Past Medical History:  Diagnosis Date  . Diabetes mellitus without complication (HCC)   . Hyperlipidemia   . Vitamin D deficiency    Allergies:  Allergies  Allergen Reactions  . Poison Ivy Extract [Poison Ivy Extract] Rash    Review of Systems:  Review of Systems  Constitutional: Negative.  Negative for chills and fever.  HENT: Negative for congestion, ear discharge, ear pain, hearing loss, nosebleeds, sinus pain, sore throat and tinnitus.   Eyes: Negative.   Respiratory: Negative for cough, hemoptysis, sputum production, shortness of breath, wheezing and stridor.   Cardiovascular: Negative.   Gastrointestinal: Negative.   Genitourinary: Negative.   Musculoskeletal: Negative for back pain, falls, joint pain, myalgias and neck pain.  Skin: Negative.   Neurological:  Negative.   Endo/Heme/Allergies: Negative.   Psychiatric/Behavioral: Negative.     Family history- Review and unchanged Social history- Review and unchanged Physical Exam: There were no vitals taken for this visit. Wt Readings from Last 3 Encounters:  10/02/17 157 lb 12.8 oz (71.6 kg)  09/06/17 161 lb 3.2 oz (73.1 kg)  07/16/17 160 lb 9.6 oz (72.8 kg)   General Appearance: Well nourished, in no apparent distress. Eyes: PERRLA, EOMs, conjunctiva no swelling or erythema Sinuses: No Frontal/maxillary tenderness ENT/Mouth: Ext aud canals clear, TMs without erythema, bulging. No erythema, swelling, or exudate on post pharynx.  Tonsils not swollen or erythematous. Hearing normal.  Neck: Supple, thyroid normal.  Respiratory: Respiratory effort normal, BS equal bilaterally without rales, rhonchi, wheezing or stridor.  Cardio: RRR with no MRGs. Brisk peripheral pulses without edema.  Abdomen: Soft, + BS,  Non tender, no guarding, rebound, hernias, masses. Lymphatics: Non tender without lymphadenopathy.  Musculoskeletal: Full ROM, 5/5 strength, Normal gait Skin: Warm, dry without rashes, lesions, ecchymosis.  Neuro: Cranial nerves intact. Normal muscle tone, no cerebellar symptoms. Psych: Awake and oriented X 3, normal affect, Insight and Judgment appropriate.    Quentin Mulling, PA-C 5:52 PM [facility]

## 2017-10-29 ENCOUNTER — Ambulatory Visit: Payer: Self-pay | Admitting: Physician Assistant

## 2017-11-03 NOTE — Progress Notes (Signed)
Assessment and Plan:   Diabetes mellitus without complication [facility]) Discussed general issues about diabetes pathophysiology and management., Educational material distributed., Suggested low cholesterol diet., Encouraged aerobic exercise., Discussed foot care., Reminded to get yearly retinal exam. GIVEN METER TO CHECK HIS SUGARS AND INSTRUCTED HOW TO DO IN THE OFFICE INCREASE METFORMIN TO 4 PILLS WILL STOP/DECREASE TORTILLAS- long discussion diet in the office  Essential hypertension - continue medications, DASH diet, exercise and monitor at home. Call if greater than 130/80.  -     CBC with Differential/Platelet -     BASIC METABOLIC PANEL WITH GFR -     Hepatic function panel -     TSH  Mixed hyperlipidemia -continue medications, check lipids, decrease fatty foods, increase activity.  - ONLY ON 1/2 PILL 2 DAYS A WEEK, NOT 3- increase to 3 days -     Lipid panel    Continue diet and meds as discussed. Further disposition pending results of labs. Future Appointments  Date Time Provider Department Center  11/05/2017 11:15 AM Quentin Mulling, PA-C [facility] None  01/03/2018 11:00 AM Judd Gaudier, NP [facility] None  04/25/2018 10:00 AM [name], MD [facility] None    HPI 52 y.o. male  presents for DM follow up  has a past medical history of Diabetes mellitus without complication (HCC), Hyperlipidemia, and Vitamin D deficiency.    His blood pressure has been controlled at home, today their BP is    He does not workout. He denies chest pain, shortness of breath, dizziness.  He is on cholesterol medication, TAKES 1 PILL TWO DAYS A WEEK and denies myalgias. His cholesterol is not at goal less than 70. The cholesterol last visit was:   Lab Results  Component Value Date   CHOL 212 (H) 10/02/2017   HDL 40 (L) 10/02/2017   LDLCALC 118 (H) 10/02/2017   TRIG 381 (H) 10/02/2017   CHOLHDL 5.3 (H) 10/02/2017    He has been working on diet and exercise for diabetes with CKD stage  2 and hyperlipidemia on glipizide 1 pill twice daily and metformin on 2 pills once daily, and denies paresthesia of the feet, polydipsia, polyuria and visual disturbances. He is on bASA, he is not on ACE  Last A1C in the office was:  Lab Results  Component Value Date   HGBA1C 9.2 (H) 10/02/2017     Lab Results  Component Value Date   GFRNONAA 80 10/02/2017   BMI is There is no height or weight on file to calculate BMI., he is working on diet and exercise. Wt Readings from Last 3 Encounters:  10/02/17 157 lb 12.8 oz (71.6 kg)  09/06/17 161 lb 3.2 oz (73.1 kg)  07/16/17 160 lb 9.6 oz (72.8 kg)    Current Medications:  Current Outpatient Medications on File Prior to Visit  Medication Sig Dispense Refill  . atorvastatin (LIPITOR) 80 MG tablet Take 1/2 to 1 tablet daily or as directed 90 tablet 1  . Cholecalciferol (VITAMIN D3) 5000 units TABS Take 5,000 Units by mouth daily.    Marland Kitchen glipiZIDE (GLUCOTROL) 5 MG tablet TAKE ONE-HALF TO ONE TABLET BY MOUTH THREE TIMES DAILY BEFORE  MEALS 270 tablet 1  . metFORMIN (GLUCOPHAGE-XR) 500 MG 24 hr tablet TAKE TWO TABLETS BY MOUTH TWICE DAILY WITH MEALS 360 tablet 1   No current facility-administered medications on file prior to visit.    Medical History:  Past Medical History:  Diagnosis Date  . Diabetes mellitus without complication (HCC)   .  Hyperlipidemia   . Vitamin D deficiency    Allergies:  Allergies  Allergen Reactions  . Poison Ivy Extract [Poison Ivy Extract] Rash    Review of Systems:  Review of Systems  Constitutional: Negative.  Negative for chills and fever.  HENT: Negative for congestion, ear discharge, ear pain, hearing loss, nosebleeds, sinus pain, sore throat and tinnitus.   Eyes: Negative.   Respiratory: Negative for cough, hemoptysis, sputum production, shortness of breath, wheezing and stridor.   Cardiovascular: Negative.   Gastrointestinal: Negative.   Genitourinary: Negative.   Musculoskeletal: Negative for back  pain, falls, joint pain, myalgias and neck pain.  Skin: Negative.   Neurological: Negative.   Endo/Heme/Allergies: Negative.   Psychiatric/Behavioral: Negative.     Family history- Review and unchanged Social history- Review and unchanged Physical Exam: There were no vitals taken for this visit. Wt Readings from Last 3 Encounters:  10/02/17 157 lb 12.8 oz (71.6 kg)  09/06/17 161 lb 3.2 oz (73.1 kg)  07/16/17 160 lb 9.6 oz (72.8 kg)   General Appearance: Well nourished, in no apparent distress. Eyes: PERRLA, EOMs, conjunctiva no swelling or erythema Sinuses: No Frontal/maxillary tenderness ENT/Mouth: Ext aud canals clear, TMs without erythema, bulging. No erythema, swelling, or exudate on post pharynx.  Tonsils not swollen or erythematous. Hearing normal.  Neck: Supple, thyroid normal.  Respiratory: Respiratory effort normal, BS equal bilaterally without rales, rhonchi, wheezing or stridor.  Cardio: RRR with no MRGs. Brisk peripheral pulses without edema.  Abdomen: Soft, + BS,  Non tender, no guarding, rebound, hernias, masses. Lymphatics: Non tender without lymphadenopathy.  Musculoskeletal: Full ROM, 5/5 strength, Normal gait Skin: Warm, dry without rashes, lesions, ecchymosis.  Neuro: Cranial nerves intact. Normal muscle tone, no cerebellar symptoms. Psych: Awake and oriented X 3, normal affect, Insight and Judgment appropriate.    Quentin Mulling, PA-C 9:18 AM [facility]

## 2017-11-05 ENCOUNTER — Encounter: Payer: Self-pay | Admitting: Physician Assistant

## 2017-11-05 ENCOUNTER — Ambulatory Visit: Payer: 59 | Admitting: Physician Assistant

## 2017-11-05 VITALS — BP 120/68 | HR 68 | Temp 97.5°F | Resp 16 | Ht 64.75 in | Wt 161.0 lb

## 2017-11-05 DIAGNOSIS — Z23 Encounter for immunization: Secondary | ICD-10-CM

## 2017-11-05 DIAGNOSIS — R0989 Other specified symptoms and signs involving the circulatory and respiratory systems: Secondary | ICD-10-CM

## 2017-11-05 DIAGNOSIS — E119 Type 2 diabetes mellitus without complications: Secondary | ICD-10-CM

## 2017-11-05 NOTE — Patient Instructions (Addendum)
INCREASE THE ATORVASTATIN TO 3 DAYS A WEEK CONTINUE THE GLUCOTROL 2 PILLS A DAY WITH LARGEST MEAL CONTINUE METFORMIN 4 PILLS TOTAL A DAY  CUT BACK ON TORTILLAS- THIS HAS A LOT OF CARBS MY FAVORITE BREADS ARE RYE BREAD AND DAVE'S KILLER BREAD INCREASE VEGGIES  INCREASE WATER, TRY GET 5 BOTTLES A DAY    Diabetes mellitus y nutricin Diabetes Mellitus and Nutrition Si sufre de diabetes (diabetes mellitus), es muy importante tener hbitos alimenticios saludables debido a que sus niveles de Psychologist, counselling sangre (glucosa) se ven afectados en gran medida por lo que come y bebe. [place] alimentos saludables en las cantidades [place], aproximadamente a la [employer], [place] ayudar a:  Scientist, physiological glucemia.  Disminuir el riesgo de sufrir una enfermedad cardaca.  Mejorar la presin arterial.  Barista o mantener un peso saludable.  Todas las personas que sufren de diabetes son diferentes y cada una tiene necesidades diferentes en cuanto a un plan de alimentacin. El mdico puede recomendarle que trabaje con un especialista en dietas y nutricin (nutricionista) para Tax adviser plan para usted. Su plan de alimentacin puede variar segn factores como:  Las caloras que necesita.  Los medicamentos que toma.  Su peso.  Sus niveles de glucemia, presin arterial y colesterol.  Su nivel de [place].  Otras afecciones que tenga, como enfermedades cardacas o renales.  Cmo me afectan los carbohidratos? Los carbohidratos afectan el nivel de glucemia ms que cualquier otro tipo de alimento. La ingesta de carbohidratos naturalmente aumenta la cantidad glucosa en la sangre. El recuento de carbohidratos es un mtodo destinado a Midwife un registro de la cantidad de carbohidratos que se ingieren. El recuento de carbohidratos es importante para Pharmacologist la glucemia a un nivel saludable, en especial si utiliza insulina o toma determinados medicamentos por va oral para la  diabetes. Es importante saber la cantidad de carbohidratos que se pueden ingerir en cada comida sin correr Surveyor, minerals. Esto es Government social research officer. El nutricionista puede ayudarlo a calcular la cantidad de carbohidratos que debe ingerir en cada comida y colacin. Los alimentos que contienen carbohidratos incluyen:  Pan, cereal, arroz, pasta y galletas.  Papas y maz.  Guisantes, frijoles y lentejas.  Leche y Dentist.  Nils Pyle y [place].  Postres, como pasteles, galletitas, helado y caramelos.  Cmo me afecta el alcohol? El alcohol puede provocar disminuciones sbitas de la glucemia (hipoglucemia), en especial si utiliza insulina o toma determinados medicamentos por va oral para la diabetes. La hipoglucemia es una afeccin potencialmente mortal. Los sntomas de la hipoglucemia (somnolencia, mareos y confusin) son similares a los sntomas de haber consumido demasiado alcohol. Si el mdico afirma que el alcohol es seguro para usted, siga estas pautas:  Limite el consumo de alcohol a no ms de 1 medida por da si es mujer y no est Orthoptist, y a 2 medidas si es hombre. Una medida equivale a 12oz ( ) de cerveza, 5oz ( ) de vino o 1oz (44ml) de bebidas de alta graduacin alcohlica.  No beba con el estmago vaco.  Mantngase hidratado con agua, gaseosas dietticas o t helado sin azcar.  Tenga en cuenta que las gaseosas comunes, los jugos y otros refrescos pueden contener mucha azcar y se deben contar como carbohidratos.  Consejos para seguir Consulting civil engineer las etiquetas de los alimentos  Comience por controlar el tamao de la porcin en la etiqueta. La cantidad de caloras, carbohidratos, grasas y otros nutrientes mencionados en la etiqueta se basan  en una porcin del alimento. Muchos alimentos contienen ms de una porcin por envase.  Verifique la cantidad total de gramos (g) de carbohidratos totales en una porcin. Puede calcular la cantidad de porciones de  carbohidratos al dividir el total de carbohidratos por 15. Por ejemplo, si un alimento posee un total de 30g de carbohidratos, equivale a 2 porciones de carbohidratos.  Verifique la cantidad de gramos (g) de grasas saturadas y grasas trans en una porcin. Escoja alimentos que no contengan grasa o que tengan un bajo contenido.  Controle la cantidad de miligramos (mg) de sodio en una porcin. La mayora de las personas deben limitar la ingesta de sodio total a menos de 2300mg  por Futures trader.  Siempre consulte la informacin nutricional de los alimentos etiquetados como "con bajo contenido de grasa" o "sin grasa". Estos alimentos pueden ser ms altos en azcar agregada o en carbohidratos refinados y deben evitarse.  Hable con el nutricionista para identificar sus objetivos diarios en cuanto a los nutrientes mencionados en la etiqueta. De compras  Evite comprar alimentos procesados, enlatados o prehechos. Estos alimentos tienden a Civil Service fast streamer cantidad de [place], sodio y azcar agregada.  Compre en la zona exterior de la tienda de comestibles. Esta incluye frutas y [employer], granos a granel, carnes frescas y productos lcteos frescos. Coccin  Utilice mtodos de coccin a baja temperatura, como hornear, en lugar de mtodos de coccin a alta temperatura, como frer en abundante aceite.  Cocine con aceites saludables, como el aceite de [place], canola o [place].  Evite cocinar con manteca, crema o carnes con alto contenido de grasa. Planificacin de las comidas  [employer] comidas y las colaciones de forma regular, preferentemente a la misma hora todos [place]. Evite pasar largos perodos de tiempo sin comer.  Consuma alimentos ricos en fibra, como frutas frescas, verduras, frijoles y cereales integrales. Consulte al nutricionista sobre cuntas porciones de carbohidratos puede consumir en cada comida.  Consuma entre 4 y 6 onzas de protenas magras por da, como carnes [place], pollo, pescado,  [employer] o tofu. 1 onza equivale a 1 onza de carne, pollo o pescado, 1 huevo, o 1/4 taza de tofu.  Coma algunos alimentos por da que contengan grasas saludables, como aguacates, frutos secos, semillas y pescado. Estilo de vida   Controle su nivel de glucemia con regularidad.  Haga ejercicio al menos , 5das o ms por semana, o como se lo haya indicado el mdico.  Tome los [employer] se lo haya indicado el mdico.  No consuma ningn producto que contenga nicotina o tabaco, como cigarrillos y Administrator, Civil Service. Si necesita ayuda para dejar de fumar, consulte al CIGNA con un asesor o instructor en diabetes para identificar estrategias para controlar el estrs y cualquier desafo emocional y social. Cules son algunas de las preguntas que puedo hacerle a mi mdico?  Es necesario que me rena con IT trainer en diabetes?  Es necesario que me rena con un nutricionista?  A qu nmero puedo llamar si tengo preguntas?  Cules son los mejores momentos para controlar la glucemia? Dnde encontrar ms informacin:  Asociacin Americana de la Diabetes (American Diabetes Association): diabetes.org/food-and-fitness/food  Academia de Nutricin y Pension scheme manager (Academy of Nutrition and Dietetics): https://www.vargas.com/  [employer] de la Diabetes y las Enfermedades Digestivas y Special educational needs teacher [facility] of Diabetes and Digestive and Kidney Diseases) (Institutos Nacionales de [place], NIH): FindJewelers.cz Resumen  Un plan de alimentacin saludable lo ayudar a Scientist, physiological glucemia y Pharmacologist un estilo de vida  saludable.  Trabajar con un especialista en dietas y nutricin (nutricionista) puede ayudarlo a Designer, television/film set de alimentacin para usted.  Tenga en cuenta que los carbohidratos y el alcohol tienen efectos inmediatos en sus  niveles de glucemia. Es importante contar los carbohidratos y consumir alcohol con prudencia. Esta informacin no tiene Theme park manager el consejo del mdico. Asegrese de hacerle al mdico cualquier pregunta que tenga. Document Released: 04/17/2007 Document Revised: 04/30/2016 Document Reviewed: 04/30/2016 Elsevier Interactive Patient Education  2018 [employer].

## 2017-11-14 DIAGNOSIS — N200 Calculus of kidney: Secondary | ICD-10-CM | POA: Diagnosis not present

## 2017-11-26 ENCOUNTER — Other Ambulatory Visit: Payer: Self-pay | Admitting: *Deleted

## 2017-11-26 DIAGNOSIS — E119 Type 2 diabetes mellitus without complications: Secondary | ICD-10-CM

## 2017-11-26 MED ORDER — GLIPIZIDE 5 MG PO TABS: ORAL_TABLET | ORAL | 1 refills | Status: DC

## 2018-01-03 ENCOUNTER — Encounter: Payer: Self-pay | Admitting: Adult Health

## 2018-01-06 NOTE — Progress Notes (Signed)
FOLLOW UP  Assessment and Plan:   Hypertension Controlled by lifestyle Monitor blood pressure at home; patient to call if consistently greater than 130/80 Continue DASH diet.   Reminder to go to the ER if any CP, SOB, nausea, dizziness, severe HA, changes vision/speech, left arm numbness and tingling and jaw pain.  Cholesterol Currently at goal; start atorvastatin 40 mg daily to simplify routine Continue low cholesterol diet and exercise.  Check lipid panel.   Diabetes with diabetic chronic kidney disease Continue medication: metformin to 4 tabs daily, increase glipizide to 5 mg TID Continue diet and exercise.  Perform daily foot/skin check, notify office of any concerning changes.  Check A1C  Obesity with co morbidities Long discussion about weight loss, diet, and exercise Recommended diet heavy in fruits and veggies and low in animal meats, cheeses, and dairy products, appropriate calorie intake Discussed ideal weight for height  Will follow up in 3 months  Vitamin D Def Below goal at last visit; continue supplementation to maintain goal of 70-100 Check Vit D level  Continue diet and meds as discussed. Further disposition pending results of labs. Discussed med's effects and SE's.   Over 30 minutes of exam, counseling, chart review, and critical decision making was performed.   Future Appointments  Date Time Provider Department Center  04/25/2018 10:00 AM Lucky CowboyMcKeown, William, MD [id number] None    ----------------------------------------------------------------------------------------------------------------------  HPI 52 y.o. male  presents for 3 month follow up on hypertension, cholesterol, diabetes, weight and vitamin D deficiency.   BMI is Body mass index is 26.76 kg/m., he has been working on diet and exercise; he has cut out white bread, soda, switched to whole wheat/whole grains. He eats fresh fruit (oranges). He uses splenda for sweetener. He tries to run at least  2-3 times a week but hasn't been able to due to weather. He does have access to gym.  Wt Readings from Last 3 Encounters:  01/07/18 159 lb 9.6 oz (72.4 kg)  11/05/17 161 lb (73 kg)  10/02/17 157 lb 12.8 oz (71.6 kg)   His blood pressure has been controlled at home, today their BP is BP: 110/76  He does workout. He denies chest pain, shortness of breath, dizziness.   He is on cholesterol medication (atorvastatin 80 mg twice weekly on Tuesday and Thursday) and denies myalgias. He admitted he had been out of medication for 3 weeks prior to last cholesterol check. His cholesterol is not at goal. The cholesterol last visit was:   Lab Results  Component Value Date   CHOL 212 (H) 10/02/2017   HDL 40 (L) 10/02/2017   LDLCALC 118 (H) 10/02/2017   TRIG 381 (H) 10/02/2017   CHOLHDL 5.3 (H) 10/02/2017    He has been working on diet and exercise for T2 diabetes, and denies foot ulcerations, increased appetite, nausea, paresthesia of the feet, polydipsia, polyuria, visual disturbances, vomiting and weight loss. He checks a fasting glucose intermittently and reports runs he reports typically in 120-140 range with rare outliers. He is currently taking metformin 500 mg taking 2000 mg since last visit , does take glipizide 5 mg BID. Last A1C in the office was:  Lab Results  Component Value Date   HGBA1C 9.2 (H) 10/02/2017   Patient is on Vitamin D supplement, takes 5000 IU but admits forgets some time    Lab Results  Component Value Date   VD25OH 22 (L) 10/02/2017        Current Medications:  Current Outpatient Medications on File  Prior to Visit  Medication Sig  . atorvastatin (LIPITOR) 80 MG tablet Take 1/2 to 1 tablet daily or as directed  . Cholecalciferol (VITAMIN D3) 5000 units TABS Take 5,000 Units by mouth daily.  Marland Kitchen glipiZIDE (GLUCOTROL) 5 MG tablet TAKE ONE-HALF TO ONE TABLET BY MOUTH THREE TIMES DAILY BEFORE  MEALS  . metFORMIN (GLUCOPHAGE-XR) 500 MG 24 hr tablet TAKE TWO TABLETS BY MOUTH  TWICE DAILY WITH MEALS   No current facility-administered medications on file prior to visit.      Allergies:  Allergies  Allergen Reactions  . Poison Ivy Extract [Poison Ivy Extract] Rash     Medical History:  Past Medical History:  Diagnosis Date  . Diabetes mellitus without complication (HCC)   . Hyperlipidemia   . Vitamin D deficiency    Family history- Reviewed and unchanged Social history- Reviewed and unchanged   Review of Systems:  Review of Systems  Constitutional: Negative for malaise/fatigue and weight loss.  HENT: Negative for hearing loss and tinnitus.   Eyes: Negative for blurred vision and double vision.  Respiratory: Negative for cough, shortness of breath and wheezing.   Cardiovascular: Negative for chest pain, palpitations, orthopnea, claudication and leg swelling.  Gastrointestinal: Negative for abdominal pain, blood in stool, constipation, diarrhea, heartburn, melena, nausea and vomiting.  Genitourinary: Negative.   Musculoskeletal: Negative for joint pain and myalgias.  Skin: Negative for rash.  Neurological: Negative for dizziness, tingling, sensory change, weakness and headaches.  Endo/Heme/Allergies: Negative for polydipsia.  Psychiatric/Behavioral: Negative.   All other systems reviewed and are negative.    Physical Exam: BP 110/76   Pulse 70   Temp 97.7 F (36.5 C)   Ht 5' 4.75" (1.645 m)   Wt 159 lb 9.6 oz (72.4 kg)   SpO2 94%   BMI 26.76 kg/m  Wt Readings from Last 3 Encounters:  01/07/18 159 lb 9.6 oz (72.4 kg)  11/05/17 161 lb (73 kg)  10/02/17 157 lb 12.8 oz (71.6 kg)   General Appearance: Well nourished, in no apparent distress. Eyes: PERRLA, EOMs, conjunctiva no swelling or erythema Sinuses: No Frontal/maxillary tenderness ENT/Mouth: Ext aud canals clear, TMs without erythema, bulging. No erythema, swelling, or exudate on post pharynx.  Tonsils not swollen or erythematous. Hearing normal.  Neck: Supple, thyroid normal.   Respiratory: Respiratory effort normal, BS equal bilaterally without rales, rhonchi, wheezing or stridor.  Cardio: RRR with no MRGs. Brisk peripheral pulses without edema.  Abdomen: Soft, + BS.  Non tender, no guarding, rebound, hernias, masses. Lymphatics: Non tender without lymphadenopathy.  Musculoskeletal: Full ROM, 5/5 strength, Normal gait. He has R thumb DIP amputation.  Skin: Warm, dry without rashes, lesions, ecchymosis.  Neuro: Cranial nerves intact. No cerebellar symptoms.  Psych: Awake and oriented X 3, normal affect, Insight and Judgment appropriate.    Dan Maker, NP 9:37 AM [facility]

## 2018-01-07 ENCOUNTER — Encounter: Payer: Self-pay | Admitting: Adult Health

## 2018-01-07 ENCOUNTER — Ambulatory Visit: Payer: 59 | Admitting: Adult Health

## 2018-01-07 VITALS — BP 110/76 | HR 70 | Temp 97.7°F | Ht 64.75 in | Wt 159.6 lb

## 2018-01-07 DIAGNOSIS — E785 Hyperlipidemia, unspecified: Secondary | ICD-10-CM | POA: Diagnosis not present

## 2018-01-07 DIAGNOSIS — E559 Vitamin D deficiency, unspecified: Secondary | ICD-10-CM

## 2018-01-07 DIAGNOSIS — Z79899 Other long term (current) drug therapy: Secondary | ICD-10-CM

## 2018-01-07 DIAGNOSIS — R0989 Other specified symptoms and signs involving the circulatory and respiratory systems: Secondary | ICD-10-CM | POA: Diagnosis not present

## 2018-01-07 DIAGNOSIS — E663 Overweight: Secondary | ICD-10-CM

## 2018-01-07 DIAGNOSIS — E119 Type 2 diabetes mellitus without complications: Secondary | ICD-10-CM | POA: Diagnosis not present

## 2018-01-07 DIAGNOSIS — E1169 Type 2 diabetes mellitus with other specified complication: Secondary | ICD-10-CM | POA: Diagnosis not present

## 2018-01-07 DIAGNOSIS — E782 Mixed hyperlipidemia: Secondary | ICD-10-CM

## 2018-01-07 MED ORDER — ATORVASTATIN CALCIUM 40 MG PO TABS: ORAL_TABLET | ORAL | 1 refills | Status: DC

## 2018-01-07 NOTE — Patient Instructions (Addendum)
Goals    . Exercise 150 min/wk Moderate Activity    . HEMOGLOBIN A1C < 7.0    . LDL CALC < 70       Please take your cholesterol medication every day (take 40 mg - 1/2 tab of 80 mg, or full tab of 40 mg)  Start taking aspirin 81 mg daily  Start taking vitamin D 5000 IU daily   Increase glipizide to 5 mg three times a day (with each meal)     Know what a healthy weight is for you (roughly BMI <25) and aim to maintain this  Aim for 7+ servings of fruits and vegetables daily  65-80+ fluid ounces of water or unsweet tea for healthy kidneys  Limit to max 1 drink of alcohol per day; avoid smoking/tobacco  Limit animal fats in diet for cholesterol and heart health - choose grass fed whenever available  Avoid highly processed foods, and foods high in saturated/trans fats  Aim for low stress - take time to unwind and care for your mental health  Aim for 150 min of moderate intensity exercise weekly for heart health, and weights twice weekly for bone health  Aim for 7-9 hours of sleep daily     Diabetes mellitus y nutricin Diabetes Mellitus and Nutrition Si sufre de diabetes (diabetes mellitus), es muy importante tener hbitos alimenticios saludables debido a que sus niveles de Psychologist, counselling sangre (glucosa) se ven afectados en gran medida por lo que come y bebe. [place] alimentos saludables en las cantidades [place], aproximadamente a la [employer], [place] ayudar a:  Scientist, physiological glucemia.  Disminuir el riesgo de sufrir una enfermedad cardaca.  Mejorar la presin arterial.  Barista o mantener un peso saludable.  Todas las personas que sufren de diabetes son diferentes y cada una tiene necesidades diferentes en cuanto a un plan de alimentacin. El mdico puede recomendarle que trabaje con un especialista en dietas y nutricin (nutricionista) para Tax adviser plan para usted. Su plan de alimentacin puede variar segn factores como:  Las caloras que  necesita.  Los medicamentos que toma.  Su peso.  Sus niveles de glucemia, presin arterial y colesterol.  Su nivel de [place].  Otras afecciones que tenga, como enfermedades cardacas o renales.  Cmo me afectan los carbohidratos? Los carbohidratos afectan el nivel de glucemia ms que cualquier otro tipo de alimento. La ingesta de carbohidratos naturalmente aumenta la cantidad glucosa en la sangre. El recuento de carbohidratos es un mtodo destinado a Midwife un registro de la cantidad de carbohidratos que se ingieren. El recuento de carbohidratos es importante para Pharmacologist la glucemia a un nivel saludable, en especial si utiliza insulina o toma determinados medicamentos por va oral para la diabetes. Es importante saber la cantidad de carbohidratos que se pueden ingerir en cada comida sin correr Surveyor, minerals. Esto es Government social research officer. El nutricionista puede ayudarlo a calcular la cantidad de carbohidratos que debe ingerir en cada comida y colacin. Los alimentos que contienen carbohidratos incluyen:  Pan, cereal, arroz, pasta y galletas.  Papas y maz.  Guisantes, frijoles y lentejas.  Leche y Dentist.  Nils Pyle y [place].  Postres, como pasteles, galletitas, helado y caramelos.  Cmo me afecta el alcohol? El alcohol puede provocar disminuciones sbitas de la glucemia (hipoglucemia), en especial si utiliza insulina o toma determinados medicamentos por va oral para la diabetes. La hipoglucemia es una afeccin potencialmente mortal. Los sntomas de la hipoglucemia (somnolencia, mareos y confusin) son  similares a los sntomas de haber consumido demasiado alcohol. Si el mdico afirma que el alcohol es seguro para usted, siga estas pautas:  Limite el consumo de alcohol a no ms de 1 medida por da si es mujer y no est Orthoptistembarazada, y a 2 medidas si es hombre. Una medida equivale a 12oz (355ml) de cerveza, 5oz (148ml) de vino o 1oz (44ml) de bebidas de alta graduacin  alcohlica.  No beba con el estmago vaco.  Mantngase hidratado con agua, gaseosas dietticas o t helado sin azcar.  Tenga en cuenta que las gaseosas comunes, los jugos y otros refrescos pueden contener mucha azcar y se deben contar como carbohidratos.  Consejos para seguir Consulting civil engineereste plan Leer las etiquetas de los alimentos  Comience por controlar el tamao de la porcin en la etiqueta. La cantidad de caloras, carbohidratos, grasas y otros nutrientes mencionados en la etiqueta se basan en una porcin del alimento. Muchos alimentos contienen ms de una porcin por envase.  Verifique la cantidad total de gramos (g) de carbohidratos totales en una porcin. Puede calcular la cantidad de porciones de carbohidratos al dividir el total de carbohidratos por 15. Por ejemplo, si un alimento posee un total de 30g de carbohidratos, equivale a 2 porciones de carbohidratos.  Verifique la cantidad de gramos (g) de grasas saturadas y grasas trans en una porcin. Escoja alimentos que no contengan grasa o que tengan un bajo contenido.  Controle la cantidad de miligramos (mg) de sodio en una porcin. La mayora de las personas deben limitar la ingesta de sodio total a menos de 2300mg  por Futures traderda.  Siempre consulte la informacin nutricional de los alimentos etiquetados como "con bajo contenido de grasa" o "sin grasa". Estos alimentos pueden ser ms altos en azcar agregada o en carbohidratos refinados y deben evitarse.  Hable con el nutricionista para identificar sus objetivos diarios en cuanto a los nutrientes mencionados en la etiqueta. De compras  Evite comprar alimentos procesados, enlatados o prehechos. Estos alimentos tienden a Civil Service fast streamertener mayor cantidad de [place], sodio y azcar agregada.  Compre en la zona exterior de la tienda de comestibles. Esta incluye frutas y Medtronicvegetales frescos, granos a granel, carnes frescas y productos lcteos frescos. Coccin  Utilice mtodos de coccin a baja temperatura, como  hornear, en lugar de mtodos de coccin a alta temperatura, como frer en abundante aceite.  Cocine con aceites saludables, como el aceite de Kendalloliva, canola o [place]ir[place]ol.  Evite cocinar con manteca, crema o carnes con alto contenido de grasa. Planificacin de las comidas  TRW AutomotiveConsuma las comidas y las colaciones de forma regular, preferentemente a la misma hora todos Reynoldslos das. Evite pasar largos perodos de tiempo sin comer.  Consuma alimentos ricos en fibra, como frutas frescas, verduras, frijoles y cereales integrales. Consulte al nutricionista sobre cuntas porciones de carbohidratos puede consumir en cada comida.  Consuma entre 4 y 6 onzas de protenas magras por da, como carnes Deferietmagras, pollo, pescado, Dillard'shuevos o tofu. 1 onza equivale a 1 onza de carne, pollo o pescado, 1 huevo, o 1/4 taza de tofu.  Coma algunos alimentos por da que contengan grasas saludables, como aguacates, frutos secos, semillas y pescado. Estilo de vida   Controle su nivel de glucemia con regularidad.  Haga ejercicio al menos 30minutos, 5das o ms por semana, o como se lo haya indicado el mdico.  Tome los [employer] Companymedicamentos como se lo haya indicado el mdico.  No consuma ningn producto que contenga nicotina o tabaco, como cigarrillos y Administrator, Civil Servicecigarrillos electrnicos. Si necesita  ayuda para dejar de fumar, consulte al [employer] con un asesor o instructor en diabetes para identificar estrategias para controlar el estrs y cualquier desafo emocional y social. Cules son algunas de las preguntas que puedo hacerle a mi mdico?  Es necesario que me rena con IT trainer en diabetes?  Es necesario que me rena con un nutricionista?  A qu nmero puedo llamar si tengo preguntas?  Cules son los mejores momentos para controlar la glucemia? Dnde encontrar ms informacin:  Asociacin Americana de la Diabetes (American Diabetes Association): diabetes.org/food-and-fitness/food  Academia de Nutricin y Pension scheme manager  (Academy of Nutrition and Dietetics): https://www.vargas.com/  [employer] de la Diabetes y las Enfermedades Digestivas y Special educational needs teacher [facility] of Diabetes and Digestive and Kidney Diseases) (Institutos Nacionales de [place], NIH): FindJewelers.cz Resumen  Un plan de alimentacin saludable lo ayudar a Scientist, physiological glucemia y Pharmacologist un estilo de vida saludable.  Trabajar con un especialista en dietas y nutricin (nutricionista) puede ayudarlo a Designer, television/film set de alimentacin para usted.  Tenga en cuenta que los carbohidratos y el alcohol tienen efectos inmediatos en sus niveles de glucemia. Es importante contar los carbohidratos y consumir alcohol con prudencia. Esta informacin no tiene Theme park manager el consejo del mdico. Asegrese de hacerle al mdico cualquier pregunta que tenga. Document Released: 04/17/2007 Document Revised: 04/30/2016 Document Reviewed: 04/30/2016 Elsevier Interactive Patient Education  2018 [employer].

## 2018-01-08 ENCOUNTER — Other Ambulatory Visit: Payer: Self-pay | Admitting: Adult Health

## 2018-01-08 LAB — COMPLETE METABOLIC PANEL WITH GFR
AG RATIO: 1.7 (calc) (ref 1.0–2.5)
ALKALINE PHOSPHATASE (APISO): 88 U/L (ref 40–115)
ALT: 20 U/L (ref 9–46)
AST: 15 U/L (ref 10–35)
Albumin: 4.5 g/dL (ref 3.6–5.1)
BILIRUBIN TOTAL: 0.6 mg/dL (ref 0.2–1.2)
BUN: 14 mg/dL (ref 7–25)
CHLORIDE: 102 mmol/L (ref 98–110)
CO2: 26 mmol/L (ref 20–32)
Calcium: 10 mg/dL (ref 8.6–10.3)
Creat: 0.8 mg/dL (ref 0.70–1.33)
GFR, Est African American: 121 mL/min/{1.73_m2} (ref >=60)
GFR, Est Non African American: 104 mL/min/{1.73_m2} (ref >=60)
GLUCOSE: 137 mg/dL — AB (ref 65–99)
Globulin: 2.6 g/dL (calc) (ref 1.9–3.7)
POTASSIUM: 4.4 mmol/L (ref 3.5–5.3)
Sodium: 136 mmol/L (ref 135–146)
TOTAL PROTEIN: 7.1 g/dL (ref 6.1–8.1)

## 2018-01-08 LAB — LIPID PANEL
Cholesterol: 199 mg/dL (ref <200)
HDL: 43 mg/dL (ref >40)
LDL Cholesterol (Calc): 117 mg/dL (calc) — ABNORMAL HIGH
NON-HDL CHOLESTEROL (CALC): 156 mg/dL — AB (ref <130)
Total CHOL/HDL Ratio: 4.6 (calc) (ref <5.0)
Triglycerides: 270 mg/dL — ABNORMAL HIGH (ref <150)

## 2018-01-08 LAB — TSH: TSH: 1.7 m[IU]/L (ref 0.40–4.50)

## 2018-01-08 LAB — HEMOGLOBIN A1C
EAG (MMOL/L): 10.1 (calc)
HEMOGLOBIN A1C: 8 %{Hb} — AB (ref <5.7)
Mean Plasma Glucose: 183 (calc)

## 2018-01-08 LAB — VITAMIN D 25 HYDROXY (VIT D DEFICIENCY, FRACTURES): Vit D, 25-Hydroxy: 20 ng/mL — ABNORMAL LOW (ref 30–100)

## 2018-01-08 LAB — CBC WITH DIFFERENTIAL/PLATELET
Absolute Monocytes: 413 cells/uL (ref 200–950)
BASOS PCT: 1 %
Basophils Absolute: 70 cells/uL (ref 0–200)
EOS PCT: 1.9 %
Eosinophils Absolute: 133 cells/uL (ref 15–500)
HCT: 49.3 % (ref 38.5–50.0)
HEMOGLOBIN: 16.5 g/dL (ref 13.2–17.1)
Lymphs Abs: 1470 cells/uL (ref 850–3900)
MCH: 29.6 pg (ref 27.0–33.0)
MCHC: 33.5 g/dL (ref 32.0–36.0)
MCV: 88.5 fL (ref 80.0–100.0)
MONOS PCT: 5.9 %
MPV: 12.1 fL (ref 7.5–12.5)
NEUTROS ABS: 4914 {cells}/uL (ref 1500–7800)
Neutrophils Relative %: 70.2 %
Platelets: 225 10*3/uL (ref 140–400)
RBC: 5.57 10*6/uL (ref 4.20–5.80)
RDW: 13 % (ref 11.0–15.0)
Total Lymphocyte: 21 %
WBC: 7 10*3/uL (ref 3.8–10.8)

## 2018-01-08 LAB — MAGNESIUM: MAGNESIUM: 2.1 mg/dL (ref 1.5–2.5)

## 2018-01-08 MED ORDER — VITAMIN D3 125 MCG (5000 UT) PO TABS: 10000.0000 [IU] | ORAL_TABLET | Freq: Every day | ORAL | Status: DC

## 2018-04-24 ENCOUNTER — Encounter: Payer: Self-pay | Admitting: Internal Medicine

## 2018-04-24 NOTE — Patient Instructions (Signed)
Coronavirus (COVID-19) Are you at risk?  Are you at risk for the Coronavirus (COVID-19)?  To be considered HIGH RISK for Coronavirus (COVID-19), you have to meet the following criteria:  . Traveled to [place], [place], [place], [place] or [place]; or in the [place] to [place], [place], [place]  . or [place]; and have fever, cough, and shortness of breath within the last 2 weeks of travel OR . Been in close contact with a person diagnosed with COVID-19 within the last 2 weeks and have  . fever, cough,and shortness of breath .  . IF YOU DO NOT MEET THESE CRITERIA, YOU ARE CONSIDERED LOW RISK FOR COVID-19.  What to do if you are HIGH RISK for COVID-19?  . If you are having a medical emergency, call 911. . Seek medical care right away. Before you go to a doctor's office, urgent care or emergency department, .  call ahead and tell them about your recent travel, contact with someone diagnosed with COVID-19  .  and your symptoms.  . You should receive instructions from your physician's office regarding next steps of care.  . When you arrive at healthcare provider, tell the healthcare staff immediately you have returned from  . visiting [place], [place], [place], [place] or [place]; or traveled in the [place] to [place], [place],  . [place] or [place] in the last two weeks or you have been in close contact with a person diagnosed with  . COVID-19 in the last 2 weeks.   . Tell the health care staff about your symptoms: fever, cough and shortness of breath. . After you have been seen by a medical provider, you will be either: o Tested for (COVID-19) and discharged home on quarantine except to seek medical care if  o symptoms worsen, and asked to  - Stay home and avoid contact with others until you get your results (4-5 days)  - Avoid travel on public transportation if possible (such as bus, train, or airplane) or o Sent to the Emergency Department by EMS for evaluation,  COVID-19 testing  and  o possible admission depending on your condition and test results.  What to do if you are LOW RISK for COVID-19?  Reduce your risk of any infection by using the same precautions used for avoiding the common cold or flu:  . Wash your hands often with soap and warm water for at least 20 seconds.  If soap and water are not readily available,  . use an alcohol-based hand sanitizer with at least 60% alcohol.  . If coughing or sneezing, cover your mouth and nose by coughing or sneezing into the elbow areas of your shirt or coat, .  into a tissue or into your sleeve (not your hands). . Avoid shaking hands with others and consider head nods or verbal greetings only. . Avoid touching your eyes, nose, or mouth with unwashed hands.  . Avoid close contact with people who are sick. . Avoid places or events with large numbers of people in one location, like concerts or sporting events. . Carefully consider travel plans you have or are making. . If you are planning any travel outside or inside the [place], visit the CDC's Travelers' Health webpage for the latest health notices. . If you have some symptoms but not all symptoms, continue to monitor at home and seek medical attention  . if your symptoms worsen. . If you are having a medical emergency, call 911. >>>>>>>>>>>>>>>>>>>>>>>>>>>>   Preventive Care for Adults  A healthy lifestyle and preventive care can promote health and wellness. Preventive health guidelines for men include the following key practices:  A routine yearly physical is a good way to check with your health care provider about your health and preventative screening. It is a chance to share any concerns and updates on your health and to receive a thorough exam.  Visit your dentist for a routine exam and preventative care every 6 months. Brush your teeth twice a day and floss once a day. Good oral hygiene prevents tooth decay and gum disease.  The frequency of eye exams  is based on your age, health, family medical history, use of contact lenses, and other factors. Follow your health care provider's recommendations for frequency of eye exams.  Eat a healthy diet. Foods such as vegetables, fruits, whole grains, low-fat dairy products, and lean protein foods contain the nutrients you need without too many calories. Decrease your intake of foods high in solid fats, added sugars, and salt. Eat the right amount of calories for you. Get information about a proper diet from your health care provider, if necessary.  Regular physical exercise is one of the most important things you can do for your health. Most adults should get at least 150 minutes of moderate-intensity exercise (any activity that increases your heart rate and causes you to sweat) each week. In addition, most adults need muscle-strengthening exercises on 2 or more days a week.  Maintain a healthy weight. The body mass index (BMI) is a screening tool to identify possible weight problems. It provides an estimate of body fat based on height and weight. Your health care provider can find your BMI and can help you achieve or maintain a healthy weight. For adults 20 years and older:  A BMI below 18.5 is considered underweight.  A BMI of 18.5 to 24.9 is normal.  A BMI of 25 to 29.9 is considered overweight.  A BMI of 30 and above is considered obese.  Maintain normal blood lipids and cholesterol levels by exercising and minimizing your intake of saturated fat. Eat a balanced diet with plenty of fruit and vegetables. Blood tests for lipids and cholesterol should begin at age 20 and be repeated every 5 years. If your lipid or cholesterol levels are high, you are over 50, or you are at high risk for heart disease, you may need your cholesterol levels checked more frequently. Ongoing high lipid and cholesterol levels should be treated with medicines if diet and exercise are not working.  If you smoke, find out from  your health care provider how to quit. If you do not use tobacco, do not start.  Lung cancer screening is recommended for adults aged 55-80 years who are at high risk for developing lung cancer because of a history of smoking. A yearly low-dose CT scan of the lungs is recommended for people who have at least a 30-pack-year history of smoking and are a current smoker or have quit within the past 15 years. A pack year of smoking is smoking an average of 1 pack of cigarettes a day for 1 year (for example: 1 pack a day for 30 years or 2 packs a day for 15 years). Yearly screening should continue until the smoker has stopped smoking for at least 15 years. Yearly screening should be stopped for people who develop a health problem that would prevent them from having lung cancer treatment.  If you choose to drink alcohol,   do not have more than 2 drinks per day. One drink is considered to be 12 ounces (355 mL) of beer, 5 ounces (148 mL) of wine, or 1.5 ounces (44 mL) of liquor.  Avoid use of street drugs. Do not share needles with anyone. Ask for help if you need support or instructions about stopping the use of drugs.  High blood pressure causes heart disease and increases the risk of stroke. Your blood pressure should be checked at least every 1-2 years. Ongoing high blood pressure should be treated with medicines, if weight loss and exercise are not effective.  If you are 45-79 years old, ask your health care provider if you should take aspirin to prevent heart disease.  Diabetes screening involves taking a blood sample to check your fasting blood sugar level. This should be done once every 3 years, after age 45, if you are within normal weight and without risk factors for diabetes. Testing should be considered at a younger age or be carried out more frequently if you are overweight and have at least 1 risk factor for diabetes.  Colorectal cancer can be detected and often prevented. Most routine colorectal  cancer screening begins at the age of 50 and continues through age 75. However, your health care provider may recommend screening at an earlier age if you have risk factors for colon cancer. On a yearly basis, your health care provider may provide home test kits to check for hidden blood in the stool. Use of a small camera at the end of a tube to directly examine the colon (sigmoidoscopy or colonoscopy) can detect the earliest forms of colorectal cancer. Talk to your health care provider about this at age 50, when routine screening begins. Direct exam of the colon should be repeated every 5-10 years through age 75, unless early forms of precancerous polyps or small growths are found.   Talk with your health care provider about prostate cancer screening.  Testicular cancer screening isrecommended for adult males. Screening includes self-exam, a health care provider exam, and other screening tests. Consult with your health care provider about any symptoms you have or any concerns you have about testicular cancer.  Use sunscreen. Apply sunscreen liberally and repeatedly throughout the day. You should seek shade when your shadow is shorter than you. Protect yourself by wearing long sleeves, pants, a wide-brimmed hat, and sunglasses year round, whenever you are outdoors.  Once a month, do a whole-body skin exam, using a mirror to look at the skin on your back. Tell your health care provider about new moles, moles that have irregular borders, moles that are larger than a pencil eraser, or moles that have changed in shape or color.  Stay current with required vaccines (immunizations).  Influenza vaccine. All adults should be immunized every year.  Tetanus, diphtheria, and acellular pertussis (Td, Tdap) vaccine. An adult who has not previously received Tdap or who does not know his vaccine status should receive 1 dose of Tdap. This initial dose should be followed by tetanus and diphtheria toxoids (Td) booster  doses every 10 years. Adults with an unknown or incomplete history of completing a 3-dose immunization series with Td-containing vaccines should begin or complete a primary immunization series including a Tdap dose. Adults should receive a Td booster every 10 years.  Varicella vaccine. An adult without evidence of immunity to varicella should receive 2 doses or a second dose if he has previously received 1 dose.  Human papillomavirus (HPV) vaccine. Males aged 13-21   years who have not received the vaccine previously should receive the 3-dose series. Males aged 22-26 years may be immunized. Immunization is recommended through the age of 26 years for any male who has sex with males and did not get any or all doses earlier. Immunization is recommended for any person with an immunocompromised condition through the age of 26 years if he did not get any or all doses earlier. During the 3-dose series, the second dose should be obtained 4-8 weeks after the first dose. The third dose should be obtained 24 weeks after the first dose and 16 weeks after the second dose.  Zoster vaccine. One dose is recommended for adults aged 60 years or older unless certain conditions are present.    PREVNAR  - Pneumococcal 13-valent conjugate (PCV13) vaccine. When indicated, a person who is uncertain of his immunization history and has no record of immunization should receive the PCV13 vaccine. An adult aged 19 years or older who has certain medical conditions and has not been previously immunized should receive 1 dose of PCV13 vaccine. This PCV13 should be followed with a dose of pneumococcal polysaccharide (PPSV23) vaccine. The PPSV23 vaccine dose should be obtained at least 1 r more year(s) after the dose of PCV13 vaccine. An adult aged 19 years or older who has certain medical conditions and previously received 1 or more doses of PPSV23 vaccine should receive 1 dose of PCV13. The PCV13 vaccine dose should be obtained 1 or more  years after the last PPSV23 vaccine dose.    PNEUMOVAX - Pneumococcal polysaccharide (PPSV23) vaccine. When PCV13 is also indicated, PCV13 should be obtained first. All adults aged 65 years and older should be immunized. An adult younger than age 65 years who has certain medical conditions should be immunized. Any person who resides in a nursing home or long-term care facility should be immunized. An adult smoker should be immunized. People with an immunocompromised condition and certain other conditions should receive both PCV13 and PPSV23 vaccines. People with human immunodeficiency virus (HIV) infection should be immunized as soon as possible after diagnosis. Immunization during chemotherapy or radiation therapy should be avoided. Routine use of PPSV23 vaccine is not recommended for American Indians, [place] Natives, or people younger than 65 years unless there are medical conditions that require PPSV23 vaccine. When indicated, people who have unknown immunization and have no record of immunization should receive PPSV23 vaccine. One-time revaccination 5 years after the first dose of PPSV23 is recommended for people aged 19-64 years who have chronic kidney failure, nephrotic syndrome, asplenia, or immunocompromised conditions. People who received 1-2 doses of PPSV23 before age 65 years should receive another dose of PPSV23 vaccine at age 65 years or later if at least 5 years have passed since the previous dose. Doses of PPSV23 are not needed for people immunized with PPSV23 at or after age 65 years.    Hepatitis A vaccine. Adults who wish to be protected from this disease, have certain high-risk conditions, work with hepatitis A-infected animals, work in hepatitis A research labs, or travel to or work in countries with a high rate of hepatitis A should be immunized. Adults who were previously unvaccinated and who anticipate close contact with an international adoptee during the first 60 days after arrival  in the [place] from a country with a high rate of hepatitis A should be immunized.    Hepatitis B vaccine. Adults should be immunized if they wish to be protected from this disease, have certain   high-risk conditions, may be exposed to blood or other infectious body fluids, are household contacts or sex partners of hepatitis B positive people, are clients or workers in certain care facilities, or travel to or work in countries with a high rate of hepatitis B.   Preventive Service / Frequency   Ages 40 to 64  Blood pressure check.  Lipid and cholesterol check  Lung cancer screening. / Every year if you are aged 55-80 years and have a 30-pack-year history of smoking and currently smoke or have quit within the past 15 years. Yearly screening is stopped once you have quit smoking for at least 15 years or develop a health problem that would prevent you from having lung cancer treatment.  Fecal occult blood test (FOBT) of stool. / Every year beginning at age 50 and continuing until age 75. You may not have to do this test if you get a colonoscopy every 10 years.  Flexible sigmoidoscopy** or colonoscopy.** / Every 5 years for a flexible sigmoidoscopy or every 10 years for a colonoscopy beginning at age 50 and continuing until age 75. Screening for abdominal aortic aneurysm (AAA)  by ultrasound is recommended for people who have history of high blood pressure or who are current or former smokers. +++++++++++ Recommend Adult Low Dose Aspirin or  coated  Aspirin 81 mg daily  To reduce risk of Colon Cancer 20 %,  Skin Cancer 26 % ,  Malignant Melanoma 46%  and  Pancreatic cancer 60% ++++++++++++++++++++ Vitamin D goal  is between 70-100.  Please make sure that you are taking your Vitamin D as directed.  It is very important as a natural anti-inflammatory  helping hair, skin, and nails, as well as reducing stroke and heart attack risk.  It helps your bones and helps with mood. It also  decreases numerous cancer risks so please take it as directed.  Low Vit D is associated with a 200-300% higher risk for CANCER  and 200-300% higher risk for HEART   ATTACK  &  STROKE.   ...................................... It is also associated with higher death rate at younger ages,  autoimmune diseases like Rheumatoid arthritis, Lupus, Multiple Sclerosis.    Also many other serious conditions, like depression, Alzheimer's Dementia, infertility, muscle aches, fatigue, fibromyalgia - just to name a few. +++++++++++++++++++++ Recommend the book "The END of DIETING" by Dr Joel Fuhrman  & the book "The END of DIABETES " by Dr Joel Fuhrman At [employer] - get book & Audio CD's    Being diabetic has a  300% increased risk for heart attack, stroke, cancer, and alzheimer- type vascular dementia. It is very important that you work harder with diet by avoiding all foods that are white. Avoid white rice (brown & wild rice is OK), white potatoes (sweetpotatoes in moderation is OK), White bread or wheat bread or anything made out of white flour like bagels, donuts, rolls, buns, biscuits, cakes, pastries, cookies, pizza crust, and pasta (made from white flour & egg whites) - vegetarian pasta or spinach or wheat pasta is OK. Multigrain breads like Arnold's or [employer], or multigrain sandwich thins or flatbreads.  Diet, exercise and weight loss can reverse and cure diabetes in the early stages.  Diet, exercise and weight loss is very important in the control and prevention of complications of diabetes which affects every system in your body, ie. Brain - dementia/stroke, eyes - glaucoma/blindness, heart - heart attack/heart failure, kidneys - dialysis, stomach - gastric paralysis, intestines - malabsorption,   nerves - severe painful neuritis, circulation - gangrene & loss of a leg(s), and finally cancer and Alzheimers.    I recommend avoid fried & greasy foods,  sweets/candy, white rice (brown or wild rice or  Quinoa is OK), white potatoes (sweet potatoes are OK) - anything made from white flour - bagels, doughnuts, rolls, buns, biscuits,white and wheat breads, pizza crust and traditional pasta made of white flour & egg white(vegetarian pasta or spinach or wheat pasta is OK).  Multi-grain bread is OK - like multi-grain flat bread or sandwich thins. Avoid alcohol in excess. Exercise is also important.    Eat all the vegetables you want - avoid meat, especially red meat and dairy - especially cheese.  Cheese is the most concentrated form of trans-fats which is the worst thing to clog up our arteries. Veggie cheese is OK which can be found in the fresh produce section at [employer] or [employer] or [employer]  ++++++++++++++++++++++ DASH Eating Plan  DASH stands for "Dietary Approaches to Stop Hypertension."   The DASH eating plan is a healthy eating plan that has been shown to reduce high blood pressure (hypertension). Additional health benefits may include reducing the risk of type 2 diabetes mellitus, heart disease, and stroke. The DASH eating plan may also help with weight loss. WHAT DO I NEED TO KNOW ABOUT THE DASH EATING PLAN? For the DASH eating plan, you will follow these general guidelines:  Choose foods with a percent daily value for sodium of less than 5% (as listed on the food label).  Use salt-free seasonings or herbs instead of table salt or sea salt.  Check with your health care provider or pharmacist before using salt substitutes.  Eat lower-sodium products, often labeled as "lower sodium" or "no salt added."  Eat fresh foods.  Eat more vegetables, fruits, and low-fat dairy products.  Choose whole grains. Look for the word "whole" as the first word in the ingredient list.  Choose fish   Limit sweets, desserts, sugars, and sugary drinks.  Choose heart-healthy fats.  Eat veggie cheese   Eat more home-cooked food and less restaurant, buffet, and fast food.  Limit fried  foods.  Cook foods using methods other than frying.  Limit canned vegetables. If you do use them, rinse them well to decrease the sodium.  When eating at a restaurant, ask that your food be prepared with less salt, or no salt if possible.                      WHAT FOODS CAN I EAT? Read Dr Joel Fuhrman's books on The End of Dieting & The End of Diabetes  Grains Whole grain or whole wheat bread. Brown rice. Whole grain or whole wheat pasta. Quinoa, bulgur, and whole grain cereals. Low-sodium cereals. Corn or whole wheat flour tortillas. Whole grain cornbread. Whole grain crackers. Low-sodium crackers.  Vegetables Fresh or frozen vegetables (raw, steamed, roasted, or grilled). Low-sodium or reduced-sodium tomato and vegetable juices. Low-sodium or reduced-sodium tomato sauce and paste. Low-sodium or reduced-sodium canned vegetables.   Fruits All fresh, canned (in natural juice), or frozen fruits.  Protein Products  All fish and seafood.  Dried beans, peas, or lentils. Unsalted nuts and seeds. Unsalted canned beans.  Dairy Low-fat dairy products, such as skim or 1% milk, 2% or reduced-fat cheeses, low-fat ricotta or cottage cheese, or plain low-fat yogurt. Low-sodium or reduced-sodium cheeses.  Fats and Oils Tub margarines without trans fats. Light or reduced-fat mayonnaise   and salad dressings (reduced sodium). Avocado. Safflower, olive, or canola oils. Natural peanut or almond butter.  Other Unsalted popcorn and pretzels. The items listed above may not be a complete list of recommended foods or beverages. Contact your dietitian for more options.  +++++++++++++++++++  WHAT FOODS ARE NOT RECOMMENDED? Grains/ White flour or wheat flour White bread. White pasta. White rice. Refined cornbread. Bagels and croissants. Crackers that contain trans fat.  Vegetables  Creamed or fried vegetables. Vegetables in a . Regular canned vegetables. Regular canned tomato sauce and paste. Regular  tomato and vegetable juices.  Fruits Dried fruits. Canned fruit in light or heavy syrup. Fruit juice.  Meat and Other Protein Products Meat in general - RED meat & White meat.  Fatty cuts of meat. Ribs, chicken wings, all processed meats as bacon, sausage, bologna, salami, fatback, hot dogs, bratwurst and packaged luncheon meats.  Dairy Whole or 2% milk, cream, half-and-half, and cream cheese. Whole-fat or sweetened yogurt. Full-fat cheeses or blue cheese. Non-dairy creamers and whipped toppings. Processed cheese, cheese spreads, or cheese curds.  Condiments Onion and garlic salt, seasoned salt, table salt, and sea salt. Canned and packaged gravies. Worcestershire sauce. Tartar sauce. Barbecue sauce. Teriyaki sauce. Soy sauce, including reduced sodium. Steak sauce. Fish sauce. Oyster sauce. Cocktail sauce. Horseradish. Ketchup and mustard. Meat flavorings and tenderizers. Bouillon cubes. Hot sauce. Tabasco sauce. Marinades. Taco seasonings. Relishes.  Fats and Oils Butter, stick margarine, lard, shortening and bacon fat. Coconut, palm kernel, or palm oils. Regular salad dressings.  Pickles and olives. Salted popcorn and pretzels.  The items listed above may not be a complete list of foods and beverages to avoid.    

## 2018-04-24 NOTE — Progress Notes (Signed)
[facility]   Victor Mcintyre, M.D.     Victor Mcintyre. Victor Mcintyre, P.A.-C Victor Gaudier, DNP [facility]                [address]                [place], [place]. [address] Telephone [phone] Telefax [phone] Annual  Screening/Preventative Visit  & Comprehensive Evaluation & Examination     This very nice 53 y.o.  Married Victor Mcintyre presents for a Screening /Preventative Visit & comprehensive evaluation and management of multiple medical co-morbidities.  Patient has been followed for HTN, HLD, T2_NIDDM  and Vitamin D Deficiency. Patient has hx/o candidiasis if his foresking and reports intermittant discomfort & discharge     Patient has labile HTN being followed expectantly. Patient's BP has been controlled and today's BP is at goal - 112/70. Patient denies any cardiac symptoms as chest pain, palpitations, shortness of breath, dizziness or ankle swelling.     Patient's hyperlipidemia has not been controlled with diet and infrequent use of his medications. Patient denies myalgias or other medication SE's. Last lipids were not at goal:  Lab Results  Component Value Date   CHOL 199 01/07/2018   HDL 43 01/07/2018   LDLCALC 117 (H) 01/07/2018   TRIG 270 (H) 01/07/2018   CHOLHDL 4.6 01/07/2018      Patient has hx/o T2_NIDDM (A1c 6.7% / 2013)  and patient denies reactive hypoglycemic symptoms, visual blurring, diabetic polys or paresthesias. Last A1c was not at goal: Lab Results  Component Value Date   HGBA1C 8.0 (H) 01/07/2018       Finally, patient has history of Vitamin D Deficiency ("24" / 2013 and "24" / 2016) and last vitamin D was still very low: Lab Results  Component Value Date   VD25OH 20 (L) 01/07/2018   Current Outpatient Medications on File Prior to Visit  Medication Sig  . aspirin EC 81 MG tablet Take 81 mg by mouth daily.  Marland Kitchen atorvastatin (LIPITOR) 40 MG tablet Take 40 mg every day in the evening for  cholesterol.  . Cholecalciferol (VITAMIN D3) 125 MCG (5000 UT) TABS Take 2 tablets (10,000 Units total) by mouth daily.  Marland Kitchen glipiZIDE (GLUCOTROL) 5 MG tablet TAKE ONE-HALF TO ONE TABLET BY MOUTH THREE TIMES DAILY BEFORE  MEALS  . metFORMIN (GLUCOPHAGE-XR) 500 MG 24 hr tablet TAKE TWO TABLETS BY MOUTH TWICE DAILY WITH MEALS   No current facility-administered medications on file prior to visit.    Allergies  Allergen Reactions  . Poison Ivy Extract [Poison Ivy Extract] Rash   Past Medical History:  Diagnosis Date  . Diabetes mellitus without complication (HCC)   . Hyperlipidemia   . Vitamin D deficiency    Health Maintenance  Topic Date Due  . OPHTHALMOLOGY EXAM  05/29/2017  . URINE MICROALBUMIN  03/30/2018  . HEMOGLOBIN A1C  07/09/2018  . INFLUENZA VACCINE  08/23/2018  . FOOT EXAM  04/24/2019  . Fecal DNA (Cologuard)  04/18/2020  . TETANUS/TDAP  12/10/2021  . PNEUMOCOCCAL POLYSACCHARIDE VACCINE AGE 22-64 HIGH RISK  Completed  . HIV Screening  Completed   Immunization History  Administered Date(s) Administered  . Influenza Inj Mdck Quad With Preservative 11/05/2017  . Influenza Split 12/29/2013  . Influenza, Seasonal, Injecte, Preservative Fre 11/11/2015  . PPD Test 12/29/2012, 12/29/2013, 01/26/2015, 02/01/2016, 03/29/2017, 04/25/2018  . Pneumococcal Polysaccharide-23 12/11/2011, 03/29/2017  . Tdap 12/11/2011   Cologard - 04/18/2017- Negative &  recommended 3 year f/u - due Apr 2022  Past Surgical History:  Procedure Laterality Date  . CATARACT EXTRACTION Left 09/2016  . LITHOTRIPSY  2002   Family History  Problem Relation Age of Onset  . Cancer Mother   . Diabetes Father    Social History   Socioeconomic History  . Marital status: Single    Spouse name: Not on file  . Number of children: Not on file  . Years of education: Not on file  . Highest education level: Not on file  Occupational History  . Works for a [employer] consultant  Tobacco Use  . Smoking status:  Never Smoker  . Smokeless tobacco: Never Used  Substance and Sexual Activity  . Alcohol use: Yes    Comment: rarely drinks  . Drug use: No  . Sexual activity: Not on file    ROS Constitutional: Denies fever, chills, weight loss/gain, headaches, insomnia,  night sweats or change in appetite. Does c/o fatigue. Eyes: Denies redness, blurred vision, diplopia, discharge, itchy or watery eyes.  ENT: Denies discharge, congestion, post nasal drip, epistaxis, sore throat, earache, hearing loss, dental pain, Tinnitus, Vertigo, Sinus pain or snoring.  Cardio: Denies chest pain, palpitations, irregular heartbeat, syncope, dyspnea, diaphoresis, orthopnea, PND, claudication or edema Respiratory: denies cough, dyspnea, DOE, pleurisy, hoarseness, laryngitis or wheezing.  Gastrointestinal: Denies dysphagia, heartburn, reflux, water brash, pain, cramps, nausea, vomiting, bloating, diarrhea, constipation, hematemesis, melena, hematochezia, jaundice or hemorrhoids Genitourinary: Denies dysuria, frequency, urgency, nocturia, hesitancy, discharge, hematuria or flank pain Musculoskeletal: Denies arthralgia, myalgia, stiffness, Jt. Swelling, pain, limp or strain/sprain. Denies Falls. Skin: Denies puritis, rash, hives, warts, acne, eczema or change in skin lesion Neuro: No weakness, tremor, incoordination, spasms, paresthesia or pain Psychiatric: Denies confusion, memory loss or sensory loss. Denies Depression. Endocrine: Denies change in weight, skin, hair change, nocturia, and paresthesia, diabetic polys, visual blurring or hyper / hypo glycemic episodes.  Heme/Lymph: No excessive bleeding, bruising or enlarged lymph nodes.  Physical Exam  BP 112/70   Pulse 72   Temp (!) 97.3 F (36.3 C)   Resp 16   Ht 5' 4.5" (1.638 m)   Wt 156 lb 9.6 oz (71 kg)   BMI 26.47 kg/m   General Appearance: Well nourished and well groomed and in no apparent distress.  Eyes: PERRLA, EOMs, conjunctiva no swelling or erythema,  normal fundi and vessels. Sinuses: No frontal/maxillary tenderness ENT/Mouth: EACs patent / TMs  nl. Nares clear without erythema, swelling, mucoid exudates. Oral hygiene is good. No erythema, swelling, or exudate. Tongue normal, non-obstructing. Tonsils not swollen or erythematous. Hearing normal.  Neck: Supple, thyroid not palpable. No bruits, nodes or JVD. Respiratory: Respiratory effort normal.  BS equal and clear bilateral without rales, rhonci, wheezing or stridor. Cardio: Heart sounds are normal with regular rate and rhythm and no murmurs, rubs or gallops. Peripheral pulses are normal and equal bilaterally without edema. No aortic or femoral bruits. Chest: symmetric with normal excursions and percussion.  Abdomen: Soft, with Nl bowel sounds. Nontender, no guarding, rebound, hernias, masses, or organomegaly.  Lymphatics: Non tender without lymphadenopathy.  Musculoskeletal: Full ROM all peripheral extremities, joint stability, 5/5 strength, and normal gait. Skin: Warm and dry without rashes, lesions, cyanosis, clubbing or  ecchymosis.  Neuro: Cranial nerves intact, reflexes equal bilaterally. Normal muscle tone, no cerebellar symptoms. Sensation intact to touch, vibratory and Monofilament to the toes bilaterally.  Pysch: Alert and oriented X 3 with normal affect, insight and judgment appropriate.   Assessment and Plan  1. Annual Preventative/Screening Exam   2. Labile hypertension  - EKG 12-Lead - [place], RETROPERITNL ABD,  LTD - Urinalysis, Routine w reflex microscopic - Microalbumin / creatinine urine ratio - CBC with Differential/Platelet - COMPLETE METABOLIC PANEL WITH GFR - Magnesium - TSH  3. Hyperlipidemia, mixed  - EKG 12-Lead - [place], RETROPERITNL ABD,  LTD - Lipid panel - TSH  4. Type 2 diabetes mellitus with hyperlipidemia (HCC)  - EKG 12-Lead - [place], RETROPERITNL ABD,  LTD - Urinalysis, Routine w reflex microscopic - Microalbumin / creatinine urine ratio - HM  DIABETES FOOT EXAM - LOW EXTREMITY NEUR EXAM DOCUM - Hemoglobin A1c - Insulin, random  5. Vitamin D deficiency  - VITAMIN D 25 Hydroxyl  6. Screening for colorectal cancer  - POC Hemoccult Bld/Stl  7. Prostate cancer screening  - PSA  8. Screening-pulmonary TB  - TB Skin Test  9. Screening for ischemic heart disease  - EKG 12-Lead  10. Screening for AAA (aortic abdominal aneurysm)  - [place], RETROPERITNL ABD,  LTD  11. Fatigue, unspecified type  - Iron,Total/Total Iron Binding Cap - Vitamin B12 - Testosterone - CBC with Differential/Platelet - TSH  12. Medication management  - Urinalysis, Routine w reflex microscopic - Microalbumin / creatinine urine ratio - COMPLETE METABOLIC PANEL WITH GFR - Magnesium - Lipid panel - TSH - Hemoglobin A1c - Insulin, random - VITAMIN D 25 Hydroxyl         Patient was counseled in prudent diet, weight control to achieve/maintain BMI less than 25, BP monitoring, regular exercise and medications as discussed.  Discussed med effects and SE's. Routine screening labs and tests as requested with regular follow-up as recommended. Over 40 minutes of exam, counseling, chart review and high complex critical decision making was performed

## 2018-04-25 ENCOUNTER — Ambulatory Visit: Payer: 59 | Admitting: Internal Medicine

## 2018-04-25 ENCOUNTER — Other Ambulatory Visit: Payer: Self-pay

## 2018-04-25 VITALS — BP 112/70 | HR 72 | Temp 97.3°F | Resp 16 | Ht 64.5 in | Wt 156.6 lb

## 2018-04-25 DIAGNOSIS — E782 Mixed hyperlipidemia: Secondary | ICD-10-CM | POA: Diagnosis not present

## 2018-04-25 DIAGNOSIS — Z125 Encounter for screening for malignant neoplasm of prostate: Secondary | ICD-10-CM | POA: Diagnosis not present

## 2018-04-25 DIAGNOSIS — E1169 Type 2 diabetes mellitus with other specified complication: Secondary | ICD-10-CM

## 2018-04-25 DIAGNOSIS — Z79899 Other long term (current) drug therapy: Secondary | ICD-10-CM

## 2018-04-25 DIAGNOSIS — E785 Hyperlipidemia, unspecified: Secondary | ICD-10-CM

## 2018-04-25 DIAGNOSIS — R5383 Other fatigue: Secondary | ICD-10-CM

## 2018-04-25 DIAGNOSIS — Z0001 Encounter for general adult medical examination with abnormal findings: Secondary | ICD-10-CM

## 2018-04-25 DIAGNOSIS — Z111 Encounter for screening for respiratory tuberculosis: Secondary | ICD-10-CM

## 2018-04-25 DIAGNOSIS — Z1212 Encounter for screening for malignant neoplasm of rectum: Secondary | ICD-10-CM

## 2018-04-25 DIAGNOSIS — B3742 Candidal balanitis: Secondary | ICD-10-CM

## 2018-04-25 DIAGNOSIS — Z0000 Encounter for general adult medical examination without abnormal findings: Secondary | ICD-10-CM | POA: Diagnosis not present

## 2018-04-25 DIAGNOSIS — Z136 Encounter for screening for cardiovascular disorders: Secondary | ICD-10-CM | POA: Diagnosis not present

## 2018-04-25 DIAGNOSIS — Z1211 Encounter for screening for malignant neoplasm of colon: Secondary | ICD-10-CM

## 2018-04-25 DIAGNOSIS — E559 Vitamin D deficiency, unspecified: Secondary | ICD-10-CM

## 2018-04-25 DIAGNOSIS — I10 Essential (primary) hypertension: Secondary | ICD-10-CM

## 2018-04-25 DIAGNOSIS — R0989 Other specified symptoms and signs involving the circulatory and respiratory systems: Secondary | ICD-10-CM

## 2018-04-25 MED ORDER — FLUCONAZOLE 150 MG PO TABS: ORAL_TABLET | ORAL | 3 refills | Status: DC

## 2018-04-28 ENCOUNTER — Encounter: Payer: Self-pay | Admitting: *Deleted

## 2018-04-28 LAB — COMPLETE METABOLIC PANEL WITH GFR
AG Ratio: 1.7 (calc) (ref 1.0–2.5)
ALT: 21 U/L (ref 9–46)
AST: 18 U/L (ref 10–35)
Albumin: 4.5 g/dL (ref 3.6–5.1)
Alkaline phosphatase (APISO): 78 U/L (ref 35–144)
BUN: 11 mg/dL (ref 7–25)
CO2: 24 mmol/L (ref 20–32)
Calcium: 10 mg/dL (ref 8.6–10.3)
Chloride: 104 mmol/L (ref 98–110)
Creat: 0.75 mg/dL (ref 0.70–1.33)
GFR, Est African American: 123 mL/min/{1.73_m2} (ref >=60)
GFR, Est Non African American: 106 mL/min/{1.73_m2} (ref >=60)
Globulin: 2.6 g/dL (calc) (ref 1.9–3.7)
Glucose, Bld: 120 mg/dL — ABNORMAL HIGH (ref 65–99)
Potassium: 4.1 mmol/L (ref 3.5–5.3)
Sodium: 137 mmol/L (ref 135–146)
Total Bilirubin: 0.4 mg/dL (ref 0.2–1.2)
Total Protein: 7.1 g/dL (ref 6.1–8.1)

## 2018-04-28 LAB — CBC WITH DIFFERENTIAL/PLATELET
Absolute Monocytes: 452 cells/uL (ref 200–950)
Basophils Absolute: 58 cells/uL (ref 0–200)
Basophils Relative: 1 %
Eosinophils Absolute: 81 cells/uL (ref 15–500)
Eosinophils Relative: 1.4 %
HCT: 48.4 % (ref 38.5–50.0)
Hemoglobin: 16.8 g/dL (ref 13.2–17.1)
Lymphs Abs: 1723 cells/uL (ref 850–3900)
MCH: 30.3 pg (ref 27.0–33.0)
MCHC: 34.7 g/dL (ref 32.0–36.0)
MCV: 87.2 fL (ref 80.0–100.0)
MPV: 12.1 fL (ref 7.5–12.5)
Monocytes Relative: 7.8 %
Neutro Abs: 3486 cells/uL (ref 1500–7800)
Neutrophils Relative %: 60.1 %
Platelets: 217 10*3/uL (ref 140–400)
RBC: 5.55 10*6/uL (ref 4.20–5.80)
RDW: 13.1 % (ref 11.0–15.0)
Total Lymphocyte: 29.7 %
WBC: 5.8 10*3/uL (ref 3.8–10.8)

## 2018-04-28 LAB — LIPID PANEL
Cholesterol: 208 mg/dL — ABNORMAL HIGH (ref <200)
HDL: 43 mg/dL (ref >=40)
LDL Cholesterol (Calc): 126 mg/dL (calc) — ABNORMAL HIGH
Non-HDL Cholesterol (Calc): 165 mg/dL (calc) — ABNORMAL HIGH (ref <130)
Total CHOL/HDL Ratio: 4.8 (calc) (ref <5.0)
Triglycerides: 239 mg/dL — ABNORMAL HIGH (ref <150)

## 2018-04-28 LAB — VITAMIN D 25 HYDROXY (VIT D DEFICIENCY, FRACTURES): Vit D, 25-Hydroxy: 20 ng/mL — ABNORMAL LOW (ref 30–100)

## 2018-04-28 LAB — PSA: PSA: 0.5 ng/mL (ref <=4.0)

## 2018-04-28 LAB — INSULIN, RANDOM: Insulin: 12.3 u[IU]/mL

## 2018-04-28 LAB — HEMOGLOBIN A1C
Hgb A1c MFr Bld: 8.1 % of total Hgb — ABNORMAL HIGH (ref <5.7)
Mean Plasma Glucose: 186 (calc)
eAG (mmol/L): 10.3 (calc)

## 2018-04-28 LAB — MAGNESIUM: Magnesium: 2 mg/dL (ref 1.5–2.5)

## 2018-04-28 LAB — URINALYSIS, ROUTINE W REFLEX MICROSCOPIC
Bilirubin Urine: NEGATIVE
Glucose, UA: NEGATIVE
Hgb urine dipstick: NEGATIVE
Ketones, ur: NEGATIVE
Leukocytes,Ua: NEGATIVE
Nitrite: NEGATIVE
Protein, ur: NEGATIVE
Specific Gravity, Urine: 1.011 (ref 1.001–1.03)
pH: <=5 (ref 5.0–8.0)

## 2018-04-28 LAB — IRON, TOTAL/TOTAL IRON BINDING CAP
%SAT: 31 % (calc) (ref 20–48)
Iron: 105 ug/dL (ref 50–180)
TIBC: 340 mcg/dL (calc) (ref 250–425)

## 2018-04-28 LAB — MICROALBUMIN / CREATININE URINE RATIO
Creatinine, Urine: 55 mg/dL (ref 20–320)
Microalb Creat Ratio: 9 mcg/mg creat (ref <30)
Microalb, Ur: 0.5 mg/dL

## 2018-04-28 LAB — TESTOSTERONE: Testosterone: 390 ng/dL (ref 250–827)

## 2018-04-28 LAB — VITAMIN B12: Vitamin B-12: 647 pg/mL (ref 200–1100)

## 2018-04-28 LAB — TSH: TSH: 2.14 mIU/L (ref 0.40–4.50)

## 2018-05-10 ENCOUNTER — Other Ambulatory Visit: Payer: Self-pay | Admitting: Adult Health

## 2018-05-13 DIAGNOSIS — N200 Calculus of kidney: Secondary | ICD-10-CM | POA: Diagnosis not present

## 2018-05-13 DIAGNOSIS — N481 Balanitis: Secondary | ICD-10-CM | POA: Diagnosis not present

## 2018-08-01 ENCOUNTER — Ambulatory Visit: Payer: 59 | Admitting: Adult Health

## 2018-08-06 NOTE — Progress Notes (Signed)
FOLLOW UP  Assessment and Plan:   Hypertension Controlled by lifestyle Monitor blood pressure at home; patient to call if consistently greater than 130/80 Continue DASH diet.   Reminder to go to the ER if any CP, SOB, nausea, dizziness, severe HA, changes vision/speech, left arm numbness and tingling and jaw pain.  Cholesterol Currently above goal; atorvastatin 40 mg daily - has been consistently above goal, STOP atorvastatin, START rosuvastatin 40 mg daily  LDL goal is <70 Continue low cholesterol diet and exercise.  Check lipid panel.   Diabetes with diabetic chronic kidney disease Continue medication: metformin 4 tabs daily, glipizide 5 mg - taking BID - discussed increase to TID, then can add additional 1/2 -1 tab with largest meal (dinner) for fasting glucose goal of 100-130; hypoglycemia reviewed; information provided in Spanish  Continue diet and exercise.  Perform daily foot/skin check, notify office of any concerning changes.  Check A1C  BMI 26  Long discussion about weight loss, diet, and exercise Recommended diet heavy in fruits and veggies and low in animal meats, cheeses, and dairy products, appropriate calorie intake Discussed ideal weight for height  Will follow up in 3 months  Vitamin D Def Below goal at last visit; admits forgetting to take regularly but will restart continue supplementation to maintain goal of 60-100 defer Vit D level  Continue diet and meds as discussed. Further disposition pending results of labs. Discussed med's effects and SE's.   Over 30 minutes of exam, counseling, chart review, and critical decision making was performed.   Future Appointments  Date Time Provider [place]  11/07/2018  9:30 AM Unk Pinto, MD [facility] None  06/01/2019 10:00 AM Unk Pinto, MD [facility] None    ----------------------------------------------------------------------------------------------------------------------  HPI 53 y.o.  male  presents for 3 month follow up on hypertension, cholesterol, diabetes, weight and vitamin D deficiency.   BMI is Body mass index is 26.47 kg/m., he has been working on diet and exercise; he has cut out white bread, soda, switched to whole wheat/whole grains. He eats fresh fruit (oranges). He uses splenda for sweetener. He tries to run at least 2-3 times a week.   Wt Readings from Last 3 Encounters:  08/07/18 156 lb 9.6 oz (71 kg)  04/25/18 156 lb 9.6 oz (71 kg)  01/07/18 159 lb 9.6 oz (72.4 kg)   His blood pressure has been controlled at home, today their BP is BP: 108/64  He does workout. He denies chest pain, shortness of breath, dizziness.   He is on cholesterol medication (atorvastatin 40 mg daily, admits has forgotten some in the past few weeks due to working night shift) and denies myalgias. His cholesterol is not at goal. The cholesterol last visit was:   Lab Results  Component Value Date   CHOL 208 (H) 04/25/2018   HDL 43 04/25/2018   LDLCALC 126 (H) 04/25/2018   TRIG 239 (H) 04/25/2018   CHOLHDL 4.8 04/25/2018    He has been working on diet and exercise for T2 diabetes, and denies foot ulcerations, increased appetite, nausea, paresthesia of the feet, polydipsia, polyuria, visual disturbances, vomiting and weight loss. He checks a fasting glucose intermittently and reports runs he reports typically in 120-130 range, but with recent night shift has been up in 140s. He is currently taking metformin 500 mg taking 2000 mg, glipizide 5 mg - written for TID but per patient takes 2 tabs daily - with breakfast and dinner. Last A1C in the office was:  Lab Results  Component Value Date   HGBA1C 8.1 (H) 04/25/2018   Patient is on Vitamin D supplement, takes 5000 IU but admits forgets   Lab Results  Component Value Date   VD25OH 20 (L) 04/25/2018        Current Medications:  Current Outpatient Medications on File Prior to Visit  Medication Sig  . aspirin EC 81 MG tablet Take 81  mg by mouth daily.  Marland Kitchen. atorvastatin (LIPITOR) 40 MG tablet Take 40 mg every day in the evening for cholesterol.  . Cholecalciferol (VITAMIN D3) 125 MCG (5000 UT) TABS Take 2 tablets (10,000 Units total) by mouth daily. (Patient taking differently: Take 5,000 Units by mouth daily. )  . glipiZIDE (GLUCOTROL) 5 MG tablet TAKE ONE-HALF TO ONE TABLET BY MOUTH THREE TIMES DAILY BEFORE  MEALS  . metFORMIN (GLUCOPHAGE-XR) 500 MG 24 hr tablet Take 2 tablets 2 x /Day with Meals for Diabetes  . fluconazole (DIFLUCAN) 150 MG tablet Take 1 tablet 2 x /week (Mon & Thurs) if needed for yeast infection   No current facility-administered medications on file prior to visit.      Allergies:  Allergies  Allergen Reactions  . Poison Ivy Extract [Poison Ivy Extract] Rash     Medical History:  Past Medical History:  Diagnosis Date  . Diabetes mellitus without complication (HCC)   . Hyperlipidemia   . Vitamin D deficiency    Family history- Reviewed and unchanged Social history- Reviewed and unchanged   Review of Systems:  Review of Systems  Constitutional: Negative for malaise/fatigue and weight loss.  HENT: Negative for hearing loss and tinnitus.   Eyes: Negative for blurred vision and double vision.  Respiratory: Negative for cough, shortness of breath and wheezing.   Cardiovascular: Negative for chest pain, palpitations, orthopnea, claudication and leg swelling.  Gastrointestinal: Negative for abdominal pain, blood in stool, constipation, diarrhea, heartburn, melena, nausea and vomiting.  Genitourinary: Negative.   Musculoskeletal: Negative for joint pain and myalgias.  Skin: Negative for rash.  Neurological: Negative for dizziness, tingling, sensory change, weakness and headaches.  Endo/Heme/Allergies: Negative for polydipsia.  Psychiatric/Behavioral: Negative.   All other systems reviewed and are negative.    Physical Exam: BP 108/64   Pulse 69   Temp (!) 97.5 F (36.4 C)   Wt 156 lb  9.6 oz (71 kg)   SpO2 97%   BMI 26.47 kg/m  Wt Readings from Last 3 Encounters:  08/07/18 156 lb 9.6 oz (71 kg)  04/25/18 156 lb 9.6 oz (71 kg)  01/07/18 159 lb 9.6 oz (72.4 kg)   General Appearance: Well nourished, in no apparent distress. Eyes: PERRLA, EOMs, conjunctiva no swelling or erythema Sinuses: No Frontal/maxillary tenderness ENT/Mouth: Ext aud canals clear, TMs without erythema, bulging. No erythema, swelling, or exudate on post pharynx.  Tonsils not swollen or erythematous. Hearing normal.  Neck: Supple, thyroid normal.  Respiratory: Respiratory effort normal, BS equal bilaterally without rales, rhonchi, wheezing or stridor.  Cardio: RRR with no MRGs. Brisk peripheral pulses without edema.  Abdomen: Soft, + BS.  Non tender, no guarding, rebound, hernias, masses. Lymphatics: Non tender without lymphadenopathy.  Musculoskeletal: Full ROM, 5/5 strength, Normal gait. He has R thumb DIP amputation.  Skin: Warm, dry without rashes, lesions, ecchymosis.  Neuro: Cranial nerves intact. No cerebellar symptoms.  Psych: Awake and oriented X 3, normal affect, Insight and Judgment appropriate.    Dan MakerAshley C Hazen Brumett, NP 3:45 PM [facility]

## 2018-08-07 ENCOUNTER — Encounter: Payer: Self-pay | Admitting: Adult Health

## 2018-08-07 ENCOUNTER — Other Ambulatory Visit: Payer: Self-pay

## 2018-08-07 ENCOUNTER — Ambulatory Visit (INDEPENDENT_AMBULATORY_CARE_PROVIDER_SITE_OTHER): Payer: 59 | Admitting: Adult Health

## 2018-08-07 VITALS — BP 108/64 | HR 69 | Temp 97.5°F | Wt 156.6 lb

## 2018-08-07 DIAGNOSIS — E119 Type 2 diabetes mellitus without complications: Secondary | ICD-10-CM | POA: Diagnosis not present

## 2018-08-07 DIAGNOSIS — E1169 Type 2 diabetes mellitus with other specified complication: Secondary | ICD-10-CM | POA: Diagnosis not present

## 2018-08-07 DIAGNOSIS — E559 Vitamin D deficiency, unspecified: Secondary | ICD-10-CM

## 2018-08-07 DIAGNOSIS — Z6826 Body mass index (BMI) 26.0-26.9, adult: Secondary | ICD-10-CM

## 2018-08-07 DIAGNOSIS — E785 Hyperlipidemia, unspecified: Secondary | ICD-10-CM

## 2018-08-07 DIAGNOSIS — R0989 Other specified symptoms and signs involving the circulatory and respiratory systems: Secondary | ICD-10-CM

## 2018-08-07 MED ORDER — GLIPIZIDE 5 MG PO TABS: ORAL_TABLET | ORAL | 1 refills | Status: DC

## 2018-08-07 MED ORDER — ROSUVASTATIN CALCIUM 40 MG PO TABS: 40.0000 mg | ORAL_TABLET | Freq: Every day | ORAL | 1 refills | Status: DC

## 2018-08-07 NOTE — Patient Instructions (Addendum)
Goals    . Exercise 150 min/wk Moderate Activity    . HEMOGLOBIN A1C < 7.0    . LDL CALC < 70        Ask pharmacist if your metformin was one that was recalled -    STOP atorvastatin and switch to taking new medication - rosuvastatin 40 mg daily   Glipizide - start taking 1 tab with each meal - breakfast lunch and dinner - 3 tabs daily  Check fasting (morning) blood sugar - if 100-130, ok to stay with this dose  If consistently 130+, then increase dinner dose to 1.5 tabs x 1 week  If consistently 130+, and no LOW blood sugars, can increase to 2 tabs with dinner (4 tabs daily)     Glipizide tablets Qu es este medicamento? La GLIPIZIDA ayuda a tratar la diabetes tipo 2. El tratamiento se [place] con ejercicios y [place]. El medicamento ayuda a su cuerpo a usar la insulina de manera ms eficiente. Este medicamento puede ser utilizado para otros usos; si tiene alguna pregunta consulte con su proveedor de atencin mdica o con su farmacutico. MARCAS COMUNES: Glucotrol Qu le debo informar a mi profesional de la salud antes de tomar este medicamento? Necesita saber si usted presenta alguno de los siguientes problemas o situaciones:  cetoacidosis diabtica  deficiencia de glucosa -6- fosfato deshidrogenasa  enfermedad cardiaca  enfermedad renal  enfermedad heptica  porfiria  infeccin o lesin severa  enfermedad tiroidea  una reaccin alrgica o inusual a la glipizida, sulfonamidas, otros medicamentos, alimentos, colorantes o conservantes  si est embarazada o buscando quedar embarazada  si est amamantando a un beb Cmo debo utilizar este medicamento? Tome este medicamento por va oral. Ingiralo con un vaso de agua. No lo tome con alimentos. Tmelo 30 minutos antes de la comida. Siga las instrucciones de la etiqueta del [place]. Si toma este medicamento una vez al da, tmelo 30 minutos antes del desayuno.Tome sus dosis a la [employer]. No  lo tome con una frecuencia mayor a la indicada. Comunquese con su pediatra para informarse acerca del uso de este medicamento en nios. Puede requerir Sales executive. Los pacientes de ms de 65 aos de edad pueden tener reacciones ms fuertes y Print production planner dosis [employer]. Sobredosis: Pngase en contacto inmediatamente con un centro toxicolgico o una sala de urgencia si usted cree que haya tomado demasiado medicamento. ATENCIN: [employer] es solo para usted. No comparta este medicamento con nadie. Qu sucede si me olvido de una dosis? Si olvida una dosis, tmela lo antes posible. Si es casi la hora de su dosis siguiente, tome slo esa dosis. No tome dosis adicionales o dobles. Qu puede interactuar con este medicamento?  bosentano  cloranfenicol  cisapride  claritromicina  medicamentos para las infecciones micticas y por levadura  metoclopramida  probenecid  warfarina Muchos medicamentos pueden aumentar o reducir Retail buyer de azcar en la sangre, tales como:  bebidas alcohlicas  aspirina y medicamentos tipo aspirina  cloranfenicol  cromo  diurticos  hormonas femeninas, como estrgenos, progestinas o pldoras anticonceptivas  medicamentos cardiacos  isoniazida  hormonas masculinas o esteroides anablicos  medicamentos para bajar de peso  medicamentos para alergias, asma, resfros o tos  medicamentos para problemas mentales  medicamentos denominados inhibidores de la MAO, como [place], [place], [place], [place]  niacina  los [place], medicamentos para el dolor o inflamacin, como ibuprofeno o naproxeno  pentamidina  fenitona  probenecid  antibiticos quinolnicos, tales como ciprofloxacina, levofloxacino, ofloxacino  algunos suplementos dietticos a base de hierbas  medicamentos esteroideos, como la prednisona o la cortisona  hormonas tiroideas Puede ser que esta lista no menciona todas las posibles interacciones. Informe a su profesional  de KB Home	[place] de [employer] Corporation productos a base de hierbas, medicamentos de [place] o suplementos nutritivos que est tomando. Si usted fuma, consume bebidas alcohlicas o si utiliza drogas ilegales, indqueselo tambin a su profesional de KB Home	[place]. Algunas sustancias pueden interactuar con su medicamento. A qu debo estar atento al usar Coca[employer]a? Visite a su mdico o a su profesional de la salud para chequear su evolucin peridicamente. Un examen llamado HbA1C (A1C) ser monitoreado. Es un simple examen de [place]. Mide su control de azcar en la sangre durante los ltimos 2 a 3 meses. Usted recibir [employer] cada 3 a 6 meses. Aprenda cmo controlar el nivel de azcar en la sangre. Aprenda a reconocer los sntomas de bajo y alto nivel de azcar en la sangre y cmo tratarlos. Siempre lleve consigo una fuente rpida de azcar por si acaso experimenta sntomas de bajo nivel de azcar en la sangre. Ejemplos incluyen caramelos duros o tabletas de glucosa. Asegrese de que los miembros de su familia sepan que se puede ahogar si come o bebe mientras tiene sntomas graves de bajo nivel de azcar en la sangre, tales como convulsiones o prdida del conocimiento. Deben obtener ayuda mdica inmediatamente. Informe a su mdico o a su profesional de la salud si tiene alto nivel de Dispensing optician. Tal vez sea necesario cambiar la dosis de su medicamento. Si est enfermo o haciendo mucho ms ejercicio que el habitual, puede ser necesario cambiar la dosis de su medicamento. No se salte comidas. Pregunte a su mdico o a su profesional de la salud si debe evitar el consumo de alcohol. Muchos productos de venta libre para tos y resfros contienen azcar y alcohol. Estos pueden Magazine features editor de azcar en la sangre. Este medicamento puede aumentar la sensibilidad al sol. Mantngase fuera de Administrator. Si no lo puede evitar utilice ropa protectora y crema de Photographer. No utilice lmparas solares,  camas solares ni cabinas solares. Use una pulsera o cadena de identificacin mdica. Lleve consigo una tarjeta de identificacin con informacin sobre su enfermedad y Scientist, research (medical) de sus medicamentos y los horarios de las dosis. Qu efectos secundarios puedo tener al [employer] este medicamento? Efectos secundarios que debe informar a su mdico o a Barrister's clerk de la salud tan pronto como sea posible:  Chief of Staff como erupcin cutnea, picazn o urticarias, hinchazn de la cara, labios o lengua  problemas respiratorios  orina oscura  fiebre, escalofros, dolor de garganta  signos o sntomas de bajo nivel de azcar en la sangre tales como sentirse ansioso, confusin, mareos, aumento de apetito, debilidad o cansancio inusual, sudoracin, temblores, fro, irritabilidad, dolor de cabeza, visin borrosa, pulso cardaco rpido, prdida del conocimiento  sangrado o magulladuras inusuales  color amarillento de los ojos o la piel Efectos secundarios que, por lo general, no requieren Geophysical data processor (debe informarlos a su mdico a Barrister's clerk de la salud si persisten o si son molestos):  diarrea  mareos  dolor de cabeza  Geographical information systems officer de estmago  nuseas  gas estomacal Puede ser que esta lista no menciona todos los posibles efectos secundarios. Comunquese a su mdico por asesoramiento mdico [employer]. Usted puede informar los efectos secundarios a la FDA por telfono al 1-800-FDA-1088. Dnde debo guardar mi medicina?  Mantngala fuera del alcance de los nios. Gurdela a [employer], a menos de 30 grados C (63 grados F). Deseche todo el medicamento que no haya utilizado, despus de la fecha de vencimiento. ATENCIN: Este folleto es un resumen. Puede ser que no cubra toda la posible informacin. Si usted tiene preguntas acerca de esta medicina, consulte con su mdico, su farmacutico o su profesional de Technical sales engineer.  2020 Elsevier/Gold Standard (2014-03-02  00:00:00)    Rosuvastatin Tablets Qu es este medicamento? La ROSUVASTATINA es conocido como un inhibidor de la HMG-CoA reductasa o 'estatias'. Reduce el nivel de colesterol y triglicridos en la sangre. Este medicamento tambin puede reducir el riesgo de Insurance risk surveyor ataques cardiacos, derrame cerebral u otros problemas de la salud en pacientes que corren el riesgo de padecer una enfermedad cardiaca. Lucius Conn y cambios a su estilo de vida son a menudo utilizados con Fish farm manager. Este medicamento puede ser utilizado para otros usos; si tiene alguna pregunta consulte con su proveedor de atencin mdica o con su farmacutico. MARCAS COMUNES: Crestor Qu le debo informar a mi profesional de la salud antes de tomar este medicamento? Necesitan saber si usted presenta alguno de los [employer] o situaciones: diabetes si bebe alcohol con frecuencia antecedentes de accidente cerebrovascular enfermedad renal enfermedad heptica debilidad o dolores musculares enfermedad tiroidea una reaccin alrgica o inusual a la rosuvastatina, a otros medicamentos, alimentos, colorantes o conservantes si est embarazada o buscando quedar embarazada si est amamantando a un beb Cmo debo utilizar este medicamento? Tome este medicamento por va oral con un vaso de agua. Siga las instrucciones de la etiqueta del [place]. No corte, triture ni [employer]. Puede tomar este medicamento con o sin alimentos. Tome sus dosis a intervalos regulares. No tome su medicamento con una frecuencia mayor a la indicada. Hable con su pediatra para informarse acerca del uso de este medicamento en nios. Aunque este medicamento se puede recetar a nios tan pequeos como de 7 aos de edad con ciertas afecciones, existen precauciones que deben tomarse. Sobredosis: Pngase en contacto inmediatamente con un centro toxicolgico o una sala de urgencia si usted cree que haya tomado demasiado medicamento. ATENCIN: [place] [employer] es solo para usted. No comparta este medicamento con nadie. Qu sucede si me olvido de una dosis? Si olvida una dosis, tmela lo antes posible. Si debe tomar su prxima dosis en menos de 12 horas, entonces no tome la dosis que olvid. Tome la prxima dosis a la hora habitual. No tome dosis adicionales o dobles. Qu puede interactuar con este medicamento? No tome esta medicina con ninguno de los siguientes medicamentos:  medicamentos a base de hierbas, tales como levadura roja de arroz Esta medicina tambin puede interactuar con los siguientes medicamentos:  alcohol  anticidos que contienen hidrxido de aluminio o hidrxido de Statistician  ciclosporina  otros medicamentos para el colesterol alto  algunos medicamentos para la infeccin por VIH  warfarina Puede ser que esta lista no menciona todas las posibles interacciones. Informe a su profesional de KB Home	[place] de [employer] Corporation productos a base de hierbas, medicamentos de [place] o suplementos nutritivos que est tomando. Si usted fuma, consume bebidas alcohlicas o si utiliza drogas ilegales, indqueselo tambin a su profesional de KB Home	[place]. Algunas sustancias pueden interactuar con su medicamento. A qu debo estar atento al usar Coca-Cola? Visite a su mdico o a su profesional de la salud para que revise su evolucin peridicamente. Es posible que necesite realizarse pruebas peridicamente para asegurarse de  que el hgado est funcionando en forma correcta. Su profesional de la salud puede indicarle que deje de usar este medicamento si desarrolla problemas musculares. Si sus problemas musculares no desaparecen despus de dejar de usar Coca-Cola, contacte a su profesional de KB Home	[place]. No debe quedar embarazada mientras est [employer]. Las mujeres deben informar a su profesional de la salud si estn buscando quedar embarazadas o si creen que podran estar embarazadas. Existe la posibilidad de efectos  secundarios graves en un beb sin nacer. Para obtener ms informacin, hable con su profesional de la salud o su farmacutico. No debe Economist a un beb mientras est usando este medicamento. Este medicamento puede aumentar los niveles de Dispensing optician. Pregntele a su profesional de la salud si es Chartered loss adjuster cambios en la dieta o en los medicamentos si usted tiene diabetes. Si va a someterse a una operacin o a otro procedimiento, informe a su mdico que est [employer]. Este frmaco es solo parte de un programa completo para Special educational needs teacher. Su mdico o un dietista puede sugerir una dieta baja en colesterol y en grasas para ayudar. Evite beber alcohol y Copy, y siga un programa de ejercicio fsico adecuado. Este medicamento puede causar una disminucin de la coenzima Q10. Debe asegurarse de recibir suficiente coenzima Q10 mientras toma este medicamento. Converse con su profesional de la salud sobre los alimentos que come y las vitaminas que toma. Qu efectos secundarios puedo tener al [employer] este medicamento? Efectos secundarios que debe informar a su mdico o a Barrister's clerk de la salud tan pronto como sea posible: Chief of Staff, como erupcin cutnea, comezn/picazn o urticaria, e hinchazn de la cara, los labios o la lengua confusin dolor en las articulaciones prdida de memoria enrojecimiento, formacin de ampollas, descamacin o distensin de la piel, incluso dentro de la boca signos y sntomas de niveles altos de Location manager en la sangre, tales como tener ms sed o apetito, o tener que orinar con mayor frecuencia que lo habitual. Tambin puede sentirse muy cansado o tener visin borrosa. signos y sntomas de lesin muscular tales como orina amarillo oscuro; problemas para orinar o cambios en la cantidad de [place]; debilidad o cansancio inusual; Marketing executive o dolor en la espalda o en el costado color amarillento de los ojos o la piel Efectos secundarios que  generalmente no requieren atencin mdica (infrmelos a su mdico o a Barrister's clerk de la salud si persisten o si son molestos): estreimiento diarrea mareos gases dolor de cabeza nuseas dolor estomacal dificultad para dormir Engineer, production ser que esta lista no menciona todos los posibles efectos secundarios. Comunquese a su mdico por asesoramiento mdico [employer]. Usted puede informar los efectos secundarios a la FDA por telfono al 1-800-FDA-1088. Dnde debo guardar mi medicina? Mantngala fuera del alcance de los nios. Gurdela a [employer], entre 20 y 45 grados C (36 y 40 grados F). Mantenga el envase bien cerrado (protjalo de la humedad). Deseche todo el medicamento que no haya utilizado, despus de la fecha de vencimiento. ATENCIN: Este folleto es un resumen. Puede ser que no cubra toda la posible informacin. Si usted tiene preguntas acerca de esta medicina, consulte con su mdico, su farmacutico o su profesional de Technical sales engineer.  2020 Elsevier/Gold Standard (2018-01-30 00:00:00)  Hipoglucemia Hypoglycemia La hipoglucemia se produce cuando el nivel de azcar (glucosa) en la sangre es demasiado bajo. Los signos de un nivel bajo de Designer, television/film set sangre pueden [employer]  siguientes:  Sentir: ? Hambre. ? Preocupacin o nervios (ansiedad). ? Sudoracin y [employer] Corporation. ? Confusin. ? Mareos. ? Somnolencia. ? Ganas de vomitar (nuseas).  Tener: ? Latidos cardacos acelerados. ? Dolor de [place]. ? Cambios en la visin. ? Hormigueo y falta de sensibilidad (entumecimiento) alrededor de la boca, labios o lengua. ? Movimientos espasmdicos que no puede controlar (convulsiones).  Dificultades para hacer lo siguiente: ? Moverse (coordinacin). ? Dormir. ? Desmayos. ? Molestarse con facilidad (irritabilidad). Las personas que tienen diabetes y las que no tienen la enfermedad pueden tener un nivel de Location manager en la sangre bajo. El nivel bajo de  azcar en la sangre puede ocurrir rpidamente y ser Engineer, maintenance (IT). Tratamiento del nivel bajo de azcar en la sangre [place], el tratamiento de un nivel de azcar en la sangre bajo consiste en ingerir de inmediato un alimento o una bebida que contenga azcar, por ejemplo:  De 4 a 6onzas (de 120 a 171m) de jugo de frutas.  De 4 a 6onzas (de 120 a 1571m de refresco comn (no diettico).  4 onzas (12022mde lecUSG CorporationVarios caramelos duros.  1 cucharada (58m16me miel o azcar. Tratamiento del nivel bajo de azcar en la sangre si tiene diabetes Si puede pensar con claridad y tragar de manera segura, siga la regla 15/15, que consiste en lo siguiente:  Consuma 15gramos de un hidrato de carbono de accin rpida (carbohidrato). Hable con su mdico acerca de cunto debera consumir.  Lleve siempre consigo una fuente de hidratos de carbono de accin rpida, por ejemplo: ? Comprimidos de azcar (pastillas de glucosa). Tome 3 o 4 comprimidos. ? De 6 a 8unidades de caramelos duros. ? De 4 a 6onzas (de 120 a 150ml72m jugo de frutas. ? De 4 a 6onzas (de 120 a 150ml)65mrefresco comn (no diettico). ? 1 cucharada (58ml) 69miel o azcar.  Contrlese el nivel de azcar en la sangre 58minut80mespus de ingerir el hidrato de carbono.  Si el nivel de azcar en la sangre todava es igual o menor que 70mg/dl 61mmmol/l),34mgiera nuevamente 15gramos de un hidrato de carbono.  Si el nivel de azcar en la sangre no supera los 70mg/dl (34mol/l) de25ms de 3intentos, solicite ayuda de inmediato.  Ingiera una comida o una colacin en el transcurso de 1hora despus de que el nivel de azcar en la sangre se haya normalizado.  Tratamiento del nivel muy bajo de azcar en la sangre Si el nivel de azcar en la sangre es igual o menor que 54mg/dl (3mm29m), sig41mica que est muy bajo (hipoglucemia grave). Esto tambin puede causar:  Desmayos.  Movimientos  espasmdicos que no puede controlar (convulsiones).  Prdida de la conciencia (coma). Esto es una emergenciaEngineer, maintenance (IT)ver si los sntomas desaparecen. Solicite atencin mdica de inmediato. Comunquese con el servicio de emergencias de su localidad (911 en los [place]). No conduzca por sus propios medios hasta el hospiPrincipal Financialde azcar en la sangre es muy bajo y no puede ingerir ningn alimento ni bebida, tal vez deba aplicarse una inyeccin de glucagn. Un familiar o un amigo deben aprender a controlarle el azcar en la sangre y a aplicarle una inyeccin de glucagn. Pregntele al mdico si debe tener un kit de inyecciones de glucagn en su casa. Siga estas indicaciones en su casa: Indicaciones generales  Tome los medicDelphie y los recetados solamente como se lo haya indicado el mdico.  Sepa cul es su nivel  de azcar en la sangre como se lo haya indicado el mdico.  Limite el consumo de alcohol a no ms de 19mdida por da si es mujer y no est eLake Ketchum y 220midas por da si es hombre. Una medida equivale a 12onzas de cerveza (3556m 5onzas de vino (148m72m 1onzas de bebidas alcohlicas de alta graduacin (44ml17mConcurra a todas las visitas de control como se lo haya indicado el mdico. Esto es importante. Si usted tiene diabetes:   Siga el plan de atencin de la diabetes como se lo haya indicado el mdico. AsegrChief Strategy Officeracer lo siguiente: ? Conozca los signos de un nivel bajo de azcarDispensing opticianome [employer] las indicaciones. ? Siga su plan de ejercicio y de alimentacin. ? Coma a horario. No omita comidas. ? Controle su nivel de azcar en la sangre con la frecuencia que le haya indicado el mdico. Contrleselo siempre antes y despus de hacer ejercicio. ? Siga su plan para los das de enfermedad cuando no pueda comer ni beber normalmente. Elabore este plan de antemano con el mdico.  Comparta su plan de atencin de  la diabetes con: ? Sus compaeros de trabajo o de la escuela. ? Las pAnadarko Petroleum Corporationlas que conviLudlowase anlisis de orina para detecProduct managerencia de cetonas: ? Cuando est enfermo. ? Como se lo haya indicado el mdico.  Lleve consigo una tarjeta, o use un brazalete o una medalla que indique que tiene diabetes. Comunquese con un mdico si:  Tiene problemas para manteAdvertising account executivezcar en la sangre dentro del rango indicado.  Tiene un nivel de azcar en la sangre bajo con frecuencia. Solicite ayuda inmediatamente si:  Contina teniendo sntomas despus de haber comido o ingerido algo con azcar.  Su nivel de azcar en la sangre es igual o inferior a 54mg/62m3mmol/14m.  Tiene movimientos espasmdicos que no puede cIT consultantesmaya. Estos sntomas pueden indicarSales executivepere a ver si los sntomas desaparecen. Solicite atencin mdica de inmediato. Comunquese con el servicio de emergencias de su localidad (911 en los [place]). No conduzca por sus propios medios hasta eGoldman Sachsal. Resumen  La hipoglucemia sucede cuando el nivel de azcar (glucosa) en la sangre es demasiado bajo.  Las personas que tienen diabetes y las que no tienen la enfermedad pueden tener un nivel de azcar eLocation managersangre bajo. El nivel bajo de azcar en la sangre puede ocurrir rpidamente y ser una emeEngineer, maintenance (IT)rese de conocerRyland Group de un nivel bajo de azcar en la sangre y saber cmo tratarlo.  Lleve siempre consigo una fuente de azcar (hidratos de carbono de accin rpida) para tratar un nivel bajo de azcar en la sangre. Esta informacin no tiene como fiMarine scientistsejo del mdico. Asegrese de hacerle al mdico cualquier pregunta que tenga. Document Released: 02/10/2010 Document Revised: 08/21/2017 Document Reviewed: 08/21/2017 Elsevier Patient Education  2020 ElsevieReynolds American

## 2018-08-08 LAB — CBC WITH DIFFERENTIAL/PLATELET
Absolute Monocytes: 448 cells/uL (ref 200–950)
Basophils Absolute: 59 cells/uL (ref 0–200)
Basophils Relative: 1 %
Eosinophils Absolute: 89 cells/uL (ref 15–500)
Eosinophils Relative: 1.5 %
HCT: 48.7 % (ref 38.5–50.0)
Hemoglobin: 16.5 g/dL (ref 13.2–17.1)
Lymphs Abs: 1770 cells/uL (ref 850–3900)
MCH: 30.9 pg (ref 27.0–33.0)
MCHC: 33.9 g/dL (ref 32.0–36.0)
MCV: 91.2 fL (ref 80.0–100.0)
MPV: 12.3 fL (ref 7.5–12.5)
Monocytes Relative: 7.6 %
Neutro Abs: 3534 cells/uL (ref 1500–7800)
Neutrophils Relative %: 59.9 %
Platelets: 219 10*3/uL (ref 140–400)
RBC: 5.34 10*6/uL (ref 4.20–5.80)
RDW: 13 % (ref 11.0–15.0)
Total Lymphocyte: 30 %
WBC: 5.9 10*3/uL (ref 3.8–10.8)

## 2018-08-08 LAB — LIPID PANEL
Cholesterol: 218 mg/dL — ABNORMAL HIGH (ref <200)
HDL: 36 mg/dL — ABNORMAL LOW (ref >=40)
LDL Cholesterol (Calc): 126 mg/dL (calc) — ABNORMAL HIGH
Non-HDL Cholesterol (Calc): 182 mg/dL (calc) — ABNORMAL HIGH (ref <130)
Total CHOL/HDL Ratio: 6.1 (calc) — ABNORMAL HIGH (ref <5.0)
Triglycerides: 392 mg/dL — ABNORMAL HIGH (ref <150)

## 2018-08-08 LAB — COMPLETE METABOLIC PANEL WITH GFR
AG Ratio: 1.7 (calc) (ref 1.0–2.5)
ALT: 23 U/L (ref 9–46)
AST: 16 U/L (ref 10–35)
Albumin: 4.3 g/dL (ref 3.6–5.1)
Alkaline phosphatase (APISO): 70 U/L (ref 35–144)
BUN: 16 mg/dL (ref 7–25)
CO2: 24 mmol/L (ref 20–32)
Calcium: 10.1 mg/dL (ref 8.6–10.3)
Chloride: 100 mmol/L (ref 98–110)
Creat: 0.98 mg/dL (ref 0.70–1.33)
GFR, Est African American: 103 mL/min/{1.73_m2} (ref >=60)
GFR, Est Non African American: 89 mL/min/{1.73_m2} (ref >=60)
Globulin: 2.6 g/dL (calc) (ref 1.9–3.7)
Glucose, Bld: 244 mg/dL — ABNORMAL HIGH (ref 65–99)
Potassium: 4.6 mmol/L (ref 3.5–5.3)
Sodium: 136 mmol/L (ref 135–146)
Total Bilirubin: 0.4 mg/dL (ref 0.2–1.2)
Total Protein: 6.9 g/dL (ref 6.1–8.1)

## 2018-08-08 LAB — TSH: TSH: 2.22 mIU/L (ref 0.40–4.50)

## 2018-08-08 LAB — MAGNESIUM: Magnesium: 2 mg/dL (ref 1.5–2.5)

## 2018-08-08 LAB — HEMOGLOBIN A1C
Hgb A1c MFr Bld: 8.1 % of total Hgb — ABNORMAL HIGH (ref <5.7)
Mean Plasma Glucose: 186 (calc)
eAG (mmol/L): 10.3 (calc)

## 2018-11-05 ENCOUNTER — Encounter: Payer: Self-pay | Admitting: Internal Medicine

## 2018-11-05 NOTE — Patient Instructions (Signed)

## 2018-11-05 NOTE — Progress Notes (Signed)
History of Present Illness:      This very nice 53 y.o. married [place] Man presents for 6 month follow up with HTN, HLD, T2_NIDDM and Vitamin D Deficiency.       Patient is followed expectantly with Labile  HTN & BP has been controlled at home. Today's BP is at goal - 116/76. Patient has had no complaints of any cardiac type chest pain, palpitations, dyspnea / orthopnea / PND, dizziness, claudication, or dependent edema.      Hyperlipidemia is not controlled with diet & meds. Patient denies myalgias or other med SE's. Last Lipids were not at goal suspected due to poor compliance &/or comprehension:  Lab Results  Component Value Date   CHOL 205 (H) 11/06/2018   HDL 48 11/06/2018   LDLCALC 127 (H) 11/06/2018   TRIG 182 (H) 11/06/2018   CHOLHDL 4.3 11/06/2018        Also, the patient has history of T2_NIDDM (A1c 6.7% / 2013)  and has had no symptoms of reactive hypoglycemia, diabetic polys, paresthesias or visual blurring.  Last A1c was not at goal:  Lab Results  Component Value Date   HGBA1C 9.0 (H) 11/06/2018        Further, the patient also has history of Vitamin D Deficiency("24" / 2013 and "24" / 2016)  and does not supplements vitamin D as recommended. Last vitamin D was not at goal:  Lab Results  Component Value Date   VD25OH 20 (L) 11/06/2018    Current Outpatient Medications on File Prior to Visit  Medication Sig  . aspirin EC 81 MG tablet Take 81 mg by mouth daily.  . Cholecalciferol (VITAMIN D3) 125 MCG (5000 UT) TABS Take 2 tablets (10,000 Units total) by mouth daily. (Patient taking differently: Take 5,000 Units by mouth daily. )  . glipiZIDE (GLUCOTROL) 5 MG tablet TAKE ONE TAB WITH BREAKFAST AND LUNCH, AND 1-2 TABS WITH DINNER  . metFORMIN (GLUCOPHAGE-XR) 500 MG 24 hr tablet Take 2 tablets 2 x /Day with Meals for Diabetes  . rosuvastatin (CRESTOR) 40 MG tablet Take 1 tablet (40 mg total) by mouth daily.   No current facility-administered medications on  file prior to visit.     Allergies  Allergen Reactions  . Poison Ivy Extract [Poison Ivy Extract] Rash    PMHx:   Past Medical History:  Diagnosis Date  . Diabetes mellitus without complication ([place])   . Hyperlipidemia   . Vitamin D deficiency    Immunization History  Administered Date(s) Administered  . Influenza Inj Mdck Quad With Preservative 11/05/2017, 11/06/2018  . Influenza Split 12/29/2013  . Influenza, Seasonal, Injecte, Preservative Fre 11/11/2015  . PPD Test 12/29/2012, 12/29/2013, 01/26/2015, 02/01/2016, 03/29/2017, 04/25/2018  . Pneumococcal Polysaccharide-23 12/11/2011, 03/29/2017  . Tdap 12/11/2011   Past Surgical History:  Procedure Laterality Date  . CATARACT EXTRACTION Left 09/2016  . LITHOTRIPSY  2002   FHx:    Reviewed / unchanged  SHx:    Reviewed / unchanged   Systems Review:  Constitutional: Denies fever, chills, wt changes, headaches, insomnia, fatigue, night sweats, change in appetite. Eyes: Denies redness, blurred vision, diplopia, discharge, itchy, watery eyes.  ENT: Denies discharge, congestion, post nasal drip, epistaxis, sore throat, earache, hearing loss, dental pain, tinnitus, vertigo, sinus pain, snoring.  CV: Denies chest pain, palpitations, irregular heartbeat, syncope, dyspnea, diaphoresis, orthopnea, PND, claudication or edema. Respiratory: denies cough, dyspnea, DOE, pleurisy, hoarseness, laryngitis, wheezing.  Gastrointestinal: Denies dysphagia, odynophagia, heartburn, reflux, water brash,  abdominal pain or cramps, nausea, vomiting, bloating, diarrhea, constipation, hematemesis, melena, hematochezia  or hemorrhoids. Genitourinary: Denies dysuria, frequency, urgency, nocturia, hesitancy, discharge, hematuria or flank pain. Musculoskeletal: Denies arthralgias, myalgias, stiffness, jt. swelling, pain, limping or strain/sprain.  Skin: Denies pruritus, rash, hives, warts, acne, eczema or change in skin lesion(s). Neuro: No weakness,  tremor, incoordination, spasms, paresthesia or pain. Psychiatric: Denies confusion, memory loss or sensory loss. Endo: Denies change in weight, skin or hair change.  Heme/Lymph: No excessive bleeding, bruising or enlarged lymph nodes.  Physical Exam  BP 116/76   Pulse 60   Temp (!) 97.2 F (36.2 C)   Resp 16   Ht 5' 4.5" (1.638 m)   Wt 157 lb 6.4 oz (71.4 kg)   BMI 26.60 kg/m   Appears  well nourished, well groomed  and in no distress.  Eyes: PERRLA, EOMs, conjunctiva no swelling or erythema. Sinuses: No frontal/maxillary tenderness ENT/Mouth: EAC's clear, TM's nl w/o erythema, bulging. Nares clear w/o erythema, swelling, exudates. Oropharynx clear without erythema or exudates. Oral hygiene is good. Tongue normal, non obstructing. Hearing intact.  Neck: Supple. Thyroid not palpable. Car 2+/2+ without bruits, nodes or JVD. Chest: Respirations nl with BS clear & equal w/o rales, rhonchi, wheezing or stridor.  Cor: Heart sounds normal w/ regular rate and rhythm without sig. murmurs, gallops, clicks or rubs. Peripheral pulses normal and equal  without edema.  Abdomen: Soft & bowel sounds normal. Non-tender w/o guarding, rebound, hernias, masses or organomegaly.  Lymphatics: Unremarkable.  Musculoskeletal: Full ROM all peripheral extremities, joint stability, 5/5 strength and normal gait.  Skin: Warm, dry without exposed rashes, lesions or ecchymosis apparent.  Neuro: Cranial nerves intact, reflexes equal bilaterally. Sensory-motor testing grossly intact. Tendon reflexes grossly intact.  Pysch: Alert & oriented x 3.  Insight and judgement nl & appropriate. No ideations.  Assessment and Plan:  1. Labile hypertension  - Continue medication, monitor blood pressure at home.  - Continue DASH diet.  Reminder to go to the ER if any CP,  SOB, nausea, dizziness, severe HA, changes vision/speech.  - CBC with Differential/Platelet - COMPLETE METABOLIC PANEL WITH GFR - Magnesium - TSH  2.  Type 2 diabetes mellitus with hyperlipidemia (HCC)  - Continue diet/meds, exercise,& lifestyle modifications.  - Continue monitor periodic cholesterol/liver & renal functions   - Lipid panel - TSH  3. T2_NIDDM  - Continue diet, exercise  - Lifestyle modifications.  - Monitor appropriate labs.  - Hemoglobin A1c - Insulin, random  4. Vitamin D deficiency  - Continue supplementation.  - VITAMIN D 25 Hydroxyl  5. Medication management  - CBC with Differential/Platelet - COMPLETE METABOLIC PANEL WITH GFR - Magnesium - Lipid panel - TSH - Hemoglobin A1c - Insulin, random - VITAMIN D 25 Hydroxyl        Discussed  regular exercise, BP monitoring, weight control to achieve/maintain BMI less than 25 and discussed med and SE's. Recommended labs to assess and monitor clinical status with further disposition pending results of labs.  I discussed the assessment and treatment plan with the patient. The patient was provided an opportunity to ask questions and all were answered. The patient agreed with the plan and demonstrated an understanding of the instructions.  I provided over 30 minutes of exam, counseling, chart review and  complex critical decision making.  Marinus Maw, MD

## 2018-11-06 ENCOUNTER — Ambulatory Visit: Payer: 59 | Admitting: Internal Medicine

## 2018-11-06 ENCOUNTER — Other Ambulatory Visit: Payer: Self-pay

## 2018-11-06 VITALS — BP 116/76 | HR 60 | Temp 97.2°F | Resp 16 | Ht 64.5 in | Wt 157.4 lb

## 2018-11-06 DIAGNOSIS — Z79899 Other long term (current) drug therapy: Secondary | ICD-10-CM

## 2018-11-06 DIAGNOSIS — E559 Vitamin D deficiency, unspecified: Secondary | ICD-10-CM

## 2018-11-06 DIAGNOSIS — E1169 Type 2 diabetes mellitus with other specified complication: Secondary | ICD-10-CM

## 2018-11-06 DIAGNOSIS — Z23 Encounter for immunization: Secondary | ICD-10-CM | POA: Diagnosis not present

## 2018-11-06 DIAGNOSIS — R0989 Other specified symptoms and signs involving the circulatory and respiratory systems: Secondary | ICD-10-CM | POA: Diagnosis not present

## 2018-11-06 DIAGNOSIS — E785 Hyperlipidemia, unspecified: Secondary | ICD-10-CM

## 2018-11-06 DIAGNOSIS — E119 Type 2 diabetes mellitus without complications: Secondary | ICD-10-CM

## 2018-11-07 ENCOUNTER — Ambulatory Visit: Payer: 59 | Admitting: Internal Medicine

## 2018-11-07 LAB — COMPLETE METABOLIC PANEL WITH GFR
AG Ratio: 1.6 (calc) (ref 1.0–2.5)
ALT: 22 U/L (ref 9–46)
AST: 15 U/L (ref 10–35)
Albumin: 4.4 g/dL (ref 3.6–5.1)
Alkaline phosphatase (APISO): 74 U/L (ref 35–144)
BUN: 13 mg/dL (ref 7–25)
CO2: 28 mmol/L (ref 20–32)
Calcium: 9.5 mg/dL (ref 8.6–10.3)
Chloride: 102 mmol/L (ref 98–110)
Creat: 0.74 mg/dL (ref 0.70–1.33)
GFR, Est African American: 124 mL/min/{1.73_m2} (ref >=60)
GFR, Est Non African American: 107 mL/min/{1.73_m2} (ref >=60)
Globulin: 2.8 g/dL (calc) (ref 1.9–3.7)
Glucose, Bld: 174 mg/dL — ABNORMAL HIGH (ref 65–99)
Potassium: 4.5 mmol/L (ref 3.5–5.3)
Sodium: 135 mmol/L (ref 135–146)
Total Bilirubin: 0.5 mg/dL (ref 0.2–1.2)
Total Protein: 7.2 g/dL (ref 6.1–8.1)

## 2018-11-07 LAB — LIPID PANEL
Cholesterol: 205 mg/dL — ABNORMAL HIGH (ref <200)
HDL: 48 mg/dL (ref >=40)
LDL Cholesterol (Calc): 127 mg/dL (calc) — ABNORMAL HIGH
Non-HDL Cholesterol (Calc): 157 mg/dL (calc) — ABNORMAL HIGH (ref <130)
Total CHOL/HDL Ratio: 4.3 (calc) (ref <5.0)
Triglycerides: 182 mg/dL — ABNORMAL HIGH (ref <150)

## 2018-11-07 LAB — CBC WITH DIFFERENTIAL/PLATELET
Absolute Monocytes: 355 cells/uL (ref 200–950)
Basophils Absolute: 48 cells/uL (ref 0–200)
Basophils Relative: 0.9 %
Eosinophils Absolute: 101 cells/uL (ref 15–500)
Eosinophils Relative: 1.9 %
HCT: 49.4 % (ref 38.5–50.0)
Hemoglobin: 16.8 g/dL (ref 13.2–17.1)
Lymphs Abs: 1595 cells/uL (ref 850–3900)
MCH: 31.2 pg (ref 27.0–33.0)
MCHC: 34 g/dL (ref 32.0–36.0)
MCV: 91.7 fL (ref 80.0–100.0)
MPV: 12.3 fL (ref 7.5–12.5)
Monocytes Relative: 6.7 %
Neutro Abs: 3201 cells/uL (ref 1500–7800)
Neutrophils Relative %: 60.4 %
Platelets: 195 10*3/uL (ref 140–400)
RBC: 5.39 10*6/uL (ref 4.20–5.80)
RDW: 13 % (ref 11.0–15.0)
Total Lymphocyte: 30.1 %
WBC: 5.3 10*3/uL (ref 3.8–10.8)

## 2018-11-07 LAB — HEMOGLOBIN A1C
Hgb A1c MFr Bld: 9 % of total Hgb — ABNORMAL HIGH (ref <5.7)
Mean Plasma Glucose: 212 (calc)
eAG (mmol/L): 11.7 (calc)

## 2018-11-07 LAB — TSH: TSH: 1.99 mIU/L (ref 0.40–4.50)

## 2018-11-07 LAB — MAGNESIUM: Magnesium: 2.1 mg/dL (ref 1.5–2.5)

## 2018-11-07 LAB — INSULIN, RANDOM: Insulin: 7.7 u[IU]/mL

## 2018-11-07 LAB — VITAMIN D 25 HYDROXY (VIT D DEFICIENCY, FRACTURES): Vit D, 25-Hydroxy: 20 ng/mL — ABNORMAL LOW (ref 30–100)

## 2018-11-09 ENCOUNTER — Encounter: Payer: Self-pay | Admitting: Internal Medicine

## 2018-11-10 ENCOUNTER — Encounter: Payer: Self-pay | Admitting: *Deleted

## 2018-12-28 ENCOUNTER — Other Ambulatory Visit: Payer: Self-pay | Admitting: Internal Medicine

## 2018-12-28 DIAGNOSIS — E119 Type 2 diabetes mellitus without complications: Secondary | ICD-10-CM

## 2019-02-10 NOTE — Progress Notes (Signed)
3 MONTH FOLLOW UP  Assessment and Plan:   Victor Mcintyre was seen today for follow-up.  Diagnoses and all orders for this visit:  Labile hypertension Controlled by lifestyle Monitor blood pressure at home; patient to call if consistently greater than 130/80 Continue DASH diet.   Reminder to go to the ER if any CP, SOB, nausea, dizziness, severe HA, changes vision/speech, left arm numbness and tingling and jaw pain. -     CBC with Differential/Platelet -     COMPLETE METABOLIC PANEL WITH GFR -     TSH -     Magnesium  Hyperlipidemia associated with type 2 diabetes mellitus (HCC) Continue medications: Rosuvastatin 40mg  daily Discussed dietary and exercise modifications Low fat diet -     Lipid panel  Type 2 diabetes mellitus with hyperlipidemia (HCC) Continue medication: metformin 4 tabs daily, glipizide 5 mg - taking BID  Check blood glucose BID and record including food consumed Consider close follow up if A1c remains elevated or same from last check (A1c9, 11/06/18) fasting glucose goal of 100-130; hypoglycemia reviewed;   Continue diet and exercise.  Perform daily foot/skin check, notify office of any concerning changes.  Check A1C STOP drinking gatorade, switch to Gatorade Zero sugar Monitor dietary intake, decrease carbohydrates -     Hemoglobin A1c -     Insulin, random Concern related to asymptomatic hypoglycemia and non-compliance with checking blood glucose.  Consider d/c glipizide for SGLT2. Close follow up with glucose & diet log for modifiable interventions/education.  Vitamin D deficiency No supplementation at this time -     VITAMIN D 25 Hydroxy (Vit-D Deficiency, Fractures)  Overweight (BMI 25.0-29.9) Discussed dietary and exercise modifications   Medication management Continued   Vitamin D Def Below goal at last visit; admits forgetting to take regularly but will restart continue supplementation to maintain goal of 60-100 defer Vit D level  Continue  diet and meds as discussed. Further disposition pending results of labs. Discussed med's effects and SE's.   Over 30 minutes of exam, counseling, chart review, and critical decision making was performed.   Future Appointments  Date Time Provider Department Center  06/01/2019 10:00 AM 08/01/2019, MD [facility] None    ----------------------------------------------------------------------------------------------------------------------  HPI 54 y.o. male  presents for 3 month follow up on HTN, HLD, DMII, weight and vitamin D deficiency.  He reports overall he is doing well.  His DMII predates 2013, (A1c 6.7%).  He reports fasting glucose ranging from 130-48.  Denies any symptoms of hypoglycemia, even when very low.  Reports this occurs when he switching from day to night shift twice a year.  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  He reports he has not had any hypo or hyperglycemia symptoms.  He does not check his blood glucose any other times during the day.  His blood glucose monitor & supplies are not on file, suspect that he is not check as frequent as reported.  He denies any side effects from medications.  He reports he has information regarding diabetic.  For breakfast:  Buscuit or fast food, coffee with milk  Lunch: varies sometimes work provides 2014:  Prepared meals.  Reports eating tortillas frequently, does not name any other particular times with the meal.  BMI is Body mass index is 27.34 kg/m., he has been working on diet and exercise; he has cut out white bread, soda, switched to whole wheat/whole grains. He eats fresh fruit (oranges). He  uses splenda for sweetener. He exercises at work during his break and drink a gatorade afterward.   Wt Readings from Last 3 Encounters:  02/12/19 161 lb 12.8 oz (73.4 kg)  11/06/18 157 lb 6.4 oz (71.4 kg)  08/07/18 156 lb 9.6 oz (71 kg)   His blood pressure has been controlled at home, today  their BP is BP: 124/74  He does workout. He denies chest pain, shortness of breath, dizziness.   He is on cholesterol medication (atorvastatin 40 mg daily, admits has forgotten some in the past few weeks due to working night shift) and denies myalgias. His cholesterol is not at goal. The cholesterol last visit was:   Lab Results  Component Value Date   CHOL 205 (H) 11/06/2018   HDL 48 11/06/2018   LDLCALC 127 (H) 11/06/2018   TRIG 182 (H) 11/06/2018   CHOLHDL 4.3 11/06/2018    He has been working on diet and exercise for T2 diabetes, and denies foot ulcerations, increased appetite, nausea, paresthesia of the feet, polydipsia, polyuria, visual disturbances, vomiting and weight loss. He checks a fasting glucose intermittently and reports runs he reports typically in 120-130 range, but with recent night shift has been up in 140s. He is currently taking metformin 500 mg taking 2000 mg, glipizide 5 mg - written for BID but per patient takes 2 tabs daily - with breakfast and dinner. Last A1C in the office was:  Lab Results  Component Value Date   HGBA1C 9.0 (H) 11/06/2018   Patient is on Vitamin D supplement, takes 5000 IU but admits forgets   Lab Results  Component Value Date   VD25OH 20 (L) 11/06/2018        Current Medications:  Current Outpatient Medications on File Prior to Visit  Medication Sig  . aspirin EC 81 MG tablet Take 81 mg by mouth daily.  . Cholecalciferol (VITAMIN D3) 125 MCG (5000 UT) TABS Take 2 tablets (10,000 Units total) by mouth daily. (Patient taking differently: Take 5,000 Units by mouth daily. )  . glipiZIDE (GLUCOTROL) 5 MG tablet Take 1/2 to 1 tablet 3 x   /day before Meals for Diabetes  . metFORMIN (GLUCOPHAGE-XR) 500 MG 24 hr tablet Take 2 tablets 2 x /Day with Meals for Diabetes  . rosuvastatin (CRESTOR) 40 MG tablet Take 1 tablet (40 mg total) by mouth daily.   No current facility-administered medications on file prior to visit.     Allergies:   Allergies  Allergen Reactions  . Poison Ivy Extract [Poison Ivy Extract] Rash     Medical History:  Past Medical History:  Diagnosis Date  . Diabetes mellitus without complication (HCC)   . Hyperlipidemia   . Vitamin D deficiency    Family history- Reviewed and unchanged Social history- Reviewed and unchanged   Review of Systems:  Review of Systems  Constitutional: Negative for malaise/fatigue and weight loss.  HENT: Negative for hearing loss and tinnitus.   Eyes: Negative for blurred vision and double vision.  Respiratory: Negative for cough, shortness of breath and wheezing.   Cardiovascular: Negative for chest pain, palpitations, orthopnea, claudication and leg swelling.  Gastrointestinal: Negative for abdominal pain, blood in stool, constipation, diarrhea, heartburn, melena, nausea and vomiting.  Genitourinary: Negative.   Musculoskeletal: Negative for joint pain and myalgias.  Skin: Negative for rash.  Neurological: Negative for dizziness, tingling, sensory change, weakness and headaches.  Endo/Heme/Allergies: Negative for polydipsia.  Psychiatric/Behavioral: Negative.   All other systems reviewed and are negative.  Physical Exam: BP 124/74   Pulse (!) 107   Temp 98.6 F (37 C)   Ht 5' 4.5" (1.638 m)   Wt 161 lb 12.8 oz (73.4 kg)   SpO2 95%   BMI 27.34 kg/m  Wt Readings from Last 3 Encounters:  02/12/19 161 lb 12.8 oz (73.4 kg)  11/06/18 157 lb 6.4 oz (71.4 kg)  08/07/18 156 lb 9.6 oz (71 kg)   General Appearance: Well nourished, in no apparent distress. Eyes: PERRLA, EOMs, conjunctiva no swelling or erythema Sinuses: No Frontal/maxillary tenderness ENT/Mouth: Ext aud canals clear, TMs without erythema, bulging. No erythema, swelling, or exudate on post pharynx.  Tonsils not swollen or erythematous. Hearing normal.  Neck: Supple, thyroid normal.  Respiratory: Respiratory effort normal, BS equal bilaterally without rales, rhonchi, wheezing or stridor.   Cardio: RRR with no MRGs. Brisk peripheral pulses without edema.  Abdomen: Soft, + BS.  Non tender, no guarding, rebound, hernias, masses. Lymphatics: Non tender without lymphadenopathy.  Musculoskeletal: Full ROM, 5/5 strength, Normal gait. He has R thumb DIP amputation.  Skin: Warm, dry without rashes, lesions, ecchymosis.  Neuro: Cranial nerves intact. No cerebellar symptoms.  Psych: Awake and oriented X 3, normal affect, Insight and Judgment appropriate.    Garnet Sierras, NP 10:31 AM [facility]

## 2019-02-12 ENCOUNTER — Other Ambulatory Visit: Payer: Self-pay

## 2019-02-12 ENCOUNTER — Encounter: Payer: Self-pay | Admitting: Adult Health Nurse Practitioner

## 2019-02-12 ENCOUNTER — Ambulatory Visit: Payer: 59 | Admitting: Adult Health Nurse Practitioner

## 2019-02-12 VITALS — BP 124/74 | HR 107 | Temp 98.6°F | Ht 64.5 in | Wt 161.8 lb

## 2019-02-12 DIAGNOSIS — E663 Overweight: Secondary | ICD-10-CM

## 2019-02-12 DIAGNOSIS — E1169 Type 2 diabetes mellitus with other specified complication: Secondary | ICD-10-CM

## 2019-02-12 DIAGNOSIS — E559 Vitamin D deficiency, unspecified: Secondary | ICD-10-CM | POA: Diagnosis not present

## 2019-02-12 DIAGNOSIS — E785 Hyperlipidemia, unspecified: Secondary | ICD-10-CM

## 2019-02-12 DIAGNOSIS — R0989 Other specified symptoms and signs involving the circulatory and respiratory systems: Secondary | ICD-10-CM | POA: Diagnosis not present

## 2019-02-12 DIAGNOSIS — Z79899 Other long term (current) drug therapy: Secondary | ICD-10-CM

## 2019-02-12 NOTE — Patient Instructions (Addendum)
Try getting Gatorade without sugar, Gatorade Zero.  Increase water intake at least 6 bottles of water.  You can try switching to Almond milk to see if this decreases stomach upset.   You can also try adding a Probiotic supplement.  You can get this at any pharmacy.  This will help to put good bacteria back into your stomach.  Check you sugar twice a day and write it down.  Check in the morning before eating and a second time during the day.  The second check before lunch, 2 hours after lunch, before dinner or 2 hours after dinner. Also write down what you are eating.  This will be helpful to modify you diet to decrease your blood glucose.  We will call you in 1-3 days with your lab results.      Diabetes Mellitus and Nutrition, Adult When you have diabetes (diabetes mellitus), it is very important to have healthy eating habits because your blood sugar (glucose) levels are greatly affected by what you eat and drink. Eating healthy foods in the appropriate amounts, at about the same times every day, can help you:  Control your blood glucose.  Lower your risk of heart disease.  Improve your blood pressure.  Reach or maintain a healthy weight. Every person with diabetes is different, and each person has different needs for a meal plan. Your health care provider may recommend that you work with a diet and nutrition specialist (dietitian) to make a meal plan that is best for you. Your meal plan may vary depending on factors such as:  The calories you need.  The medicines you take.  Your weight.  Your blood glucose, blood pressure, and cholesterol levels.  Your activity level.  Other health conditions you have, such as heart or kidney disease. How do carbohydrates affect me? Carbohydrates, also called carbs, affect your blood glucose level more than any other type of food. Eating carbs naturally raises the amount of glucose in your blood. Carb counting is a method for keeping  track of how many carbs you eat. Counting carbs is important to keep your blood glucose at a healthy level, especially if you use insulin or take certain oral diabetes medicines. It is important to know how many carbs you can safely have in each meal. This is different for every person. Your dietitian can help you calculate how many carbs you should have at each meal and for each snack. Foods that contain carbs include:  Bread, cereal, rice, pasta, and crackers.  Potatoes and corn.  Peas, beans, and lentils.  Milk and yogurt.  Fruit and juice.  Desserts, such as cakes, cookies, ice cream, and candy. How does alcohol affect me? Alcohol can cause a sudden decrease in blood glucose (hypoglycemia), especially if you use insulin or take certain oral diabetes medicines. Hypoglycemia can be a life-threatening condition. Symptoms of hypoglycemia (sleepiness, dizziness, and confusion) are similar to symptoms of having too much alcohol. If your health care provider says that alcohol is safe for you, follow these guidelines:  Limit alcohol intake to no more than 1 drink per day for nonpregnant women and 2 drinks per day for men. One drink equals 12 oz of beer, 5 oz of wine, or 1 oz of hard liquor.  Do not drink on an empty stomach.  Keep yourself hydrated with water, diet soda, or unsweetened iced tea.  Keep in mind that regular soda, juice, and other mixers may contain a lot of sugar and must be  counted as carbs. What are tips for following this plan?  Reading food labels  Start by checking the serving size on the "Nutrition Facts" label of packaged foods and drinks. The amount of calories, carbs, fats, and other nutrients listed on the label is based on one serving of the item. Many items contain more than one serving per package.  Check the total grams (g) of carbs in one serving. You can calculate the number of servings of carbs in one serving by dividing the total carbs by 15. For example,  if a food has 30 g of total carbs, it would be equal to 2 servings of carbs.  Check the number of grams (g) of saturated and trans fats in one serving. Choose foods that have low or no amount of these fats.  Check the number of milligrams (mg) of salt (sodium) in one serving. Most people should limit total sodium intake to less than 2,300 mg per day.  Always check the nutrition information of foods labeled as "low-fat" or "nonfat". These foods may be higher in added sugar or refined carbs and should be avoided.  Talk to your dietitian to identify your daily goals for nutrients listed on the label. Shopping  Avoid buying canned, premade, or processed foods. These foods tend to be high in fat, sodium, and added sugar.  Shop around the outside edge of the grocery store. This includes fresh fruits and vegetables, bulk grains, fresh meats, and fresh dairy. Cooking  Use low-heat cooking methods, such as baking, instead of high-heat cooking methods like deep frying.  Cook using healthy oils, such as olive, canola, or sunflower oil.  Avoid cooking with butter, cream, or high-fat meats. Meal planning  Eat meals and snacks regularly, preferably at the same times every day. Avoid going long periods of time without eating.  Eat foods high in fiber, such as fresh fruits, vegetables, beans, and whole grains. Talk to your dietitian about how many servings of carbs you can eat at each meal.  Eat 4-6 ounces (oz) of lean protein each day, such as lean meat, chicken, fish, eggs, or tofu. One oz of lean protein is equal to: ? 1 oz of meat, chicken, or fish. ? 1 egg. ?  cup of tofu.  Eat some foods each day that contain healthy fats, such as avocado, nuts, seeds, and fish. Lifestyle  Check your blood glucose regularly.  Exercise regularly as told by your health care provider. This may include: ? 150 minutes of moderate-intensity or vigorous-intensity exercise each week. This could be brisk walking,  biking, or water aerobics. ? Stretching and doing strength exercises, such as yoga or weightlifting, at least 2 times a week.  Take medicines as told by your health care provider.  Do not use any products that contain nicotine or tobacco, such as cigarettes and e-cigarettes. If you need help quitting, ask your health care provider.  Work with a Social worker or diabetes educator to identify strategies to manage stress and any emotional and social challenges. Questions to ask a health care provider  Do I need to meet with a diabetes educator?  Do I need to meet with a dietitian?  What number can I call if I have questions?  When are the best times to check my blood glucose? Where to find more information:  American Diabetes Association: diabetes.org  Academy of Nutrition and Dietetics: www.eatright.[employer] of Diabetes and Digestive and Kidney Diseases (NIH): [employer] Summary  A healthy meal plan  will help you control your blood glucose and maintain a healthy lifestyle.  Working with a diet and nutrition specialist (dietitian) can help you make a meal plan that is best for you.  Keep in mind that carbohydrates (carbs) and alcohol have immediate effects on your blood glucose levels. It is important to count carbs and to use alcohol carefully. This information is not intended to replace advice given to you by your health care provider. Make sure you discuss any questions you have with your health care provider. Document Revised: 12/21/2016 Document Reviewed: 02/13/2016 Elsevier Patient Education  2020 [employer]    Blood Glucose Monitoring, Adult Monitoring your blood sugar (glucose) is an important part of managing your diabetes (diabetes mellitus). Blood glucose monitoring involves checking your blood glucose as often as directed and keeping a record (log) of your results over time. Checking your blood glucose regularly and keeping a blood glucose log  can:  Help you and your health care provider adjust your diabetes management plan as needed, including your medicines or insulin.  Help you understand how food, exercise, illnesses, and medicines affect your blood glucose.  Let you know what your blood glucose is at any time. You can quickly find out if you have low blood glucose (hypoglycemia) or high blood glucose (hyperglycemia). Your health care provider will set individualized treatment goals for you. Your goals will be based on your age, other medical conditions you have, and how you respond to diabetes treatment. Generally, the goal of treatment is to maintain the following blood glucose levels:  Before meals (preprandial): 80-130 mg/dL (2.6-8.3 mmol/L).  After meals (postprandial): below 180 mg/dL (10 mmol/L).  A1c level: less than 7%. Supplies needed:  Blood glucose meter.  Test strips for your meter. Each meter has its own strips. You must use the strips that came with your meter.  A needle to prick your finger (lancet). Do not use a lancet more than one time.  A device that holds the lancet (lancing device).  A journal or log book to write down your results. How to check your blood glucose  1. Wash your hands with soap and water. 2. Prick the side of your finger (not the tip) with the lancet. Use a different finger each time. 3. Gently rub the finger until a small drop of blood appears. 4. Follow instructions that come with your meter for inserting the test strip, applying blood to the strip, and using your blood glucose meter. 5. Write down your result and any notes. Some meters allow you to use areas of your body other than your finger (alternative sites) to test your blood. The most common alternative sites are:  Forearm.  Thigh.  Palm of the hand. If you think you may have hypoglycemia, or if you have a history of not knowing when your blood glucose is getting low (hypoglycemia unawareness), do not use  alternative sites. Use your finger instead. Alternative sites may not be as accurate as the fingers, because blood flow is slower in these areas. This means that the result you get may be delayed, and it may be different from the result that you would get from your finger. Follow these instructions at home: Blood glucose log   Every time you check your blood glucose, write down your result. Also write down any notes about things that may be affecting your blood glucose, such as your diet and exercise for the day. This information can help you and your health care provider: ?  Look for patterns in your blood glucose over time. ? Adjust your diabetes management plan as needed.  Check if your meter allows you to download your records to a computer. Most glucose meters store a record of glucose readings in the meter. If you have type 1 diabetes:  Check your blood glucose 2 or more times a day.  Also check your blood glucose: ? Before every insulin injection. ? Before and after exercise. ? Before meals. ? 2 hours after a meal. ? Occasionally between 2:00 a.m. and 3:00 a.m., as directed. ? Before potentially dangerous tasks, like driving or using heavy machinery. ? At bedtime.  You may need to check your blood glucose more often, up to 6-10 times a day, if you: ? Use an insulin pump. ? Need multiple daily injections (MDI). ? Have diabetes that is not well-controlled. ? Are ill. ? Have a history of severe hypoglycemia. ? Have hypoglycemia unawareness. If you have type 2 diabetes:  If you take insulin or other diabetes medicines, check your blood glucose 2 or more times a day.  If you are on intensive insulin therapy, check your blood glucose 4 or more times a day. Occasionally, you may also need to check between 2:00 a.m. and 3:00 a.m., as directed.  Also check your blood glucose: ? Before and after exercise. ? Before potentially dangerous tasks, like driving or using heavy machinery.   You may need to check your blood glucose more often if: ? Your medicine is being adjusted. ? Your diabetes is not well-controlled. ? You are ill. General tips  Always keep your supplies with you.  If you have questions or need help, all blood glucose meters have a 24-hour "hotline" phone number that you can call. You may also contact your health care provider.  After you use a few boxes of test strips, adjust (calibrate) your blood glucose meter by following instructions that came with your meter. Contact a health care provider if:  Your blood glucose is at or above 240 mg/dL (55.7 mmol/L) for 2 days in a row.  You have been sick or have had a fever for 2 days or longer, and you are not getting better.  You have any of the following problems for more than 6 hours: ? You cannot eat or drink. ? You have nausea or vomiting. ? You have diarrhea. Get help right away if:  Your blood glucose is lower than 54 mg/dL (3 mmol/L).  You become confused or you have trouble thinking clearly.  You have difficulty breathing.  You have moderate or large ketone levels in your urine. Summary  Monitoring your blood sugar (glucose) is an important part of managing your diabetes (diabetes mellitus).  Blood glucose monitoring involves checking your blood glucose as often as directed and keeping a record (log) of your results over time.  Your health care provider will set individualized treatment goals for you. Your goals will be based on your age, other medical conditions you have, and how you respond to diabetes treatment.  Every time you check your blood glucose, write down your result. Also write down any notes about things that may be affecting your blood glucose, such as your diet and exercise for the day. This information is not intended to replace advice given to you by your health care provider. Make sure you discuss any questions you have with your health care provider. Document Revised:  11/01/2017 Document Reviewed: 06/20/2015 Elsevier Patient Education  2020 [employer].

## 2019-02-13 LAB — COMPLETE METABOLIC PANEL WITH GFR
AG Ratio: 1.7 (calc) (ref 1.0–2.5)
ALT: 28 U/L (ref 9–46)
AST: 24 U/L (ref 10–35)
Albumin: 4.6 g/dL (ref 3.6–5.1)
Alkaline phosphatase (APISO): 74 U/L (ref 35–144)
BUN: 12 mg/dL (ref 7–25)
CO2: 27 mmol/L (ref 20–32)
Calcium: 9.7 mg/dL (ref 8.6–10.3)
Chloride: 99 mmol/L (ref 98–110)
Creat: 0.87 mg/dL (ref 0.70–1.33)
GFR, Est African American: 115 mL/min/{1.73_m2} (ref >=60)
GFR, Est Non African American: 99 mL/min/{1.73_m2} (ref >=60)
Globulin: 2.7 g/dL (calc) (ref 1.9–3.7)
Glucose, Bld: 275 mg/dL — ABNORMAL HIGH (ref 65–99)
Potassium: 4.3 mmol/L (ref 3.5–5.3)
Sodium: 133 mmol/L — ABNORMAL LOW (ref 135–146)
Total Bilirubin: 0.4 mg/dL (ref 0.2–1.2)
Total Protein: 7.3 g/dL (ref 6.1–8.1)

## 2019-02-13 LAB — LIPID PANEL
Cholesterol: 139 mg/dL (ref <200)
HDL: 46 mg/dL (ref >=40)
LDL Cholesterol (Calc): 69 mg/dL (calc)
Non-HDL Cholesterol (Calc): 93 mg/dL (calc) (ref <130)
Total CHOL/HDL Ratio: 3 (calc) (ref <5.0)
Triglycerides: 162 mg/dL — ABNORMAL HIGH (ref <150)

## 2019-02-13 LAB — CBC WITH DIFFERENTIAL/PLATELET
Absolute Monocytes: 721 cells/uL (ref 200–950)
Basophils Absolute: 68 cells/uL (ref 0–200)
Basophils Relative: 1 %
Eosinophils Absolute: 68 cells/uL (ref 15–500)
Eosinophils Relative: 1 %
HCT: 49.1 % (ref 38.5–50.0)
Hemoglobin: 16.3 g/dL (ref 13.2–17.1)
Lymphs Abs: 578 cells/uL — ABNORMAL LOW (ref 850–3900)
MCH: 30 pg (ref 27.0–33.0)
MCHC: 33.2 g/dL (ref 32.0–36.0)
MCV: 90.3 fL (ref 80.0–100.0)
MPV: 12.6 fL — ABNORMAL HIGH (ref 7.5–12.5)
Monocytes Relative: 10.6 %
Neutro Abs: 5365 cells/uL (ref 1500–7800)
Neutrophils Relative %: 78.9 %
Platelets: 192 10*3/uL (ref 140–400)
RBC: 5.44 10*6/uL (ref 4.20–5.80)
RDW: 12.8 % (ref 11.0–15.0)
Total Lymphocyte: 8.5 %
WBC: 6.8 10*3/uL (ref 3.8–10.8)

## 2019-02-13 LAB — HEMOGLOBIN A1C
Hgb A1c MFr Bld: 9.4 % of total Hgb — ABNORMAL HIGH (ref <5.7)
Mean Plasma Glucose: 223 (calc)
eAG (mmol/L): 12.4 (calc)

## 2019-02-13 LAB — TSH: TSH: 1.55 mIU/L (ref 0.40–4.50)

## 2019-02-13 LAB — INSULIN, RANDOM: Insulin: 23.6 u[IU]/mL — ABNORMAL HIGH

## 2019-02-13 LAB — MAGNESIUM: Magnesium: 2.1 mg/dL (ref 1.5–2.5)

## 2019-02-13 LAB — VITAMIN D 25 HYDROXY (VIT D DEFICIENCY, FRACTURES): Vit D, 25-Hydroxy: 20 ng/mL — ABNORMAL LOW (ref 30–100)

## 2019-02-16 ENCOUNTER — Other Ambulatory Visit: Payer: Self-pay | Admitting: Adult Health Nurse Practitioner

## 2019-02-16 DIAGNOSIS — E559 Vitamin D deficiency, unspecified: Secondary | ICD-10-CM

## 2019-02-16 MED ORDER — CHOLECALCIFEROL 1.25 MG (50000 UT) PO CAPS: ORAL_CAPSULE | ORAL | 0 refills | Status: DC

## 2019-04-13 ENCOUNTER — Other Ambulatory Visit: Payer: Self-pay

## 2019-04-13 DIAGNOSIS — E785 Hyperlipidemia, unspecified: Secondary | ICD-10-CM

## 2019-04-13 DIAGNOSIS — E1169 Type 2 diabetes mellitus with other specified complication: Secondary | ICD-10-CM

## 2019-04-13 MED ORDER — METFORMIN HCL ER 500 MG PO TB24: ORAL_TABLET | ORAL | 1 refills | Status: DC

## 2019-04-16 ENCOUNTER — Ambulatory Visit (INDEPENDENT_AMBULATORY_CARE_PROVIDER_SITE_OTHER): Payer: 59 | Admitting: Adult Health

## 2019-04-16 ENCOUNTER — Encounter: Payer: Self-pay | Admitting: Adult Health

## 2019-04-16 ENCOUNTER — Other Ambulatory Visit: Payer: Self-pay

## 2019-04-16 VITALS — BP 110/70 | HR 83 | Temp 97.7°F | Wt 159.2 lb

## 2019-04-16 DIAGNOSIS — L239 Allergic contact dermatitis, unspecified cause: Secondary | ICD-10-CM | POA: Diagnosis not present

## 2019-04-16 MED ORDER — PREDNISONE 20 MG PO TABS: ORAL_TABLET | ORAL | 0 refills | Status: DC

## 2019-04-16 NOTE — Patient Instructions (Addendum)
Dermatitis de contacto Contact Dermatitis La dermatitis es el enrojecimiento, el dolor y la hinchazn (inflamacin) de la piel. La dermatitis de contacto es una reaccin a algo que toca la piel. Hay dos tipos de dermatitis de contacto:  Dermatitis de contacto irritativa. Esto ocurre cuando algo molesta (irrita) la piel, como el jabn.  Dermatitis de contacto alrgica. Esto se produce cuando una persona se expone a algo a lo que es alrgica, como la hiedra venenosa. Cules son las causas?  Las causas frecuentes de la dermatitis de contacto irritante incluyen las siguientes: ? Maquillaje. ? Jabones. ? Detergentes. ? Lavandina. ? cidos. ? Metales, como el nquel.  Las causas frecuentes de la dermatitis de contacto alrgica incluyen las siguientes: ? Plantas. ? Productos qumicos. ? Alhajas. ? Ltex. ? Medicamentos. ? Conservantes que se utilizan en determinados productos, como la ropa. Qu incrementa el riesgo?  Tener un trabajo que lo expone a cosas que causan molestias en la piel.  Tener asma o eczema. Cules son los signos o los sntomas? Los sntomas pueden ocurrir en cualquier parte de la piel que la sustancia irritante haya tocado. Algunos de los sntomas son los siguientes:  Piel seca o descamada.  Enrojecimiento.  Grietas.  Picazn.  Dolor o sensacin de ardor.  Ampollas.  Sangre o lquido transparente que drena de las grietas de la piel. En el caso de la dermatitis de contacto alrgica, puede haber hinchazn. Esto puede ocurrir en lugares como los prpados, la boca o los genitales. Cmo se trata?  Esta afeccin se trata mediante un control para detectar la causa de la reaccin y proteger la piel. El tratamiento tambin puede incluir lo siguiente: ? Ungentos, medicamentos o cremas con corticoesteroides. ? Antibiticos u otros ungentos, si tiene una infeccin en la piel. ? Lociones o medicamentos para aliviar la picazn. ? Una venda (vendaje). Siga  estas indicaciones en su casa: Cuidado de la piel  Humctese la piel segn sea necesario.  Aplique compresas fras sobre la piel.  Pngase una pasta de bicarbonato de sodio sobre la piel. Agregue agua al bicarbonato de sodio hasta que parezca una pasta.  No se rasque la piel.  Evite que las cosas le rocen la piel.  Evite el uso de jabones, perfumes y tintes. Medicamentos  Tome o aplique los medicamentos de venta libre y los recetados solamente como se lo haya indicado el mdico.  Si le recetaron un antibitico, tmelo o aplquelo como se lo haya indicado el mdico. No deje de usarlo aunque la afeccin empiece a mejorar. Baarse  Tome un bao con: ? Sales de [place]. ? Bicarbonato de sodio. ? Avena coloidal.  Bese con menos frecuencia.  Bese con agua tibia. No use agua caliente. Cuidado de la venda  Si le colocaron una venda, cmbiela como se lo haya indicado el mdico.  Lvese las manos con agua y jabn antes y despus de cambiarse la venda. Use desinfectante para manos si no dispone de agua y jabn. Indicaciones generales  Evite las cosas que le causaron la reaccin. Si no sabe qu la caus, lleve un diario. Escriba los siguientes datos: ? Lo que come. ? Los productos para la piel que usa. ? Lo que bebe. ? Lo que lleva puesto en la zona que tiene los sntomas. Esto incluye las alhajas.  Controle todos los das las zonas afectadas para detectar signos de infeccin. Est atento a los siguientes signos: ? Aumento del enrojecimiento, la hinchazn o el dolor. ? Ms lquido o sangre. ?   Calor. ? Pus o mal olor.  Concurra a todas las visitas de [address] se lo haya indicado el mdico. Esto es importante. Comunquese con un mdico si:  No mejora con el tratamiento.  Su afeccin empeora.  Tiene signos de infeccin, como los siguientes: ? Aumento de la hinchazn. ? Dolor a Insurance claims handler. ? Aumento del enrojecimiento. ? Molestias. ? Calor.  Tiene  fiebre.  Aparecen nuevos sntomas. Solicite ayuda inmediatamente si:  Tiene un dolor de cabeza muy intenso.  Siente dolor en el cuello.  Tiene el cuello rgido.  Vomita.  Se siente muy somnoliento.  Nota unas lneas rojas en la piel que salen de la zona.  El hueso o la articulacin que se encuentran cerca de la zona le duelen despus de que la piel se [place].  La zona se oscurece.  Tiene dificultad para respirar. Resumen  La dermatitis es el enrojecimiento, el dolor y la hinchazn de la piel.  Los sntomas pueden ocurrir en donde la sustancia irritante lo ha tocado.  El tratamiento puede incluir medicamentos y cuidado de la piel.  Si no conoce la causa de Radiation protection practitioner, lleve un diario.  Comunquese con un mdico si su afeccin empeora o si tiene signos de infeccin. Esta informacin no tiene Theme park manager el consejo del mdico. Asegrese de hacerle al mdico cualquier pregunta que tenga. Document Revised: 09/11/2017 Document Reviewed: 09/11/2017 Elsevier Patient Education  2020 Elsevier Inc.      Prednisolone tablets Qu es este medicamento? La PREDNISOLONA es un corticosteroide. Se utiliza para tratar la inflamacin de la piel, articulaciones, pulmones y otros rganos. Condiciones comunes tratadas incluyen asma, alergias y artritis. Tambin se [place] para [employer], tales como trastornos sanguneos o enfermedades de la glndula suprarrenal. Este medicamento puede ser utilizado para otros usos; si tiene alguna pregunta consulte con su proveedor de atencin mdica o con su farmacutico. MARCAS COMUNES: Millipred, Millipred DP, Millipred DP 12-Day, Millipred DP 6 Day, Prednoral Qu le debo informar a mi profesional de la salud antes de tomar este medicamento? Necesita saber si usted presenta alguno de los siguientes problemas o situaciones:  sndrome de [place]  diabetes  glaucoma  problemas cardiacos o enfermedad cardiaca  alta presin  sangunea  infecciones, tales como herpes, sarampin, tuberculosis o varicela  enfermedad renal  enfermedad hepatica  problemas mentales  miastenia gravis  osteoporosis  convulsiones  enfermedad estomacal, intestinal o de la lcera, incluyendo colitis y diverticulitis  problemas tiroideos  una reaccin alrgica o inusual a la prednisolona, a otros medicamentos, alimentos, colorantes o conservantes  si est embarazada o buscando quedar embarazada  si est amamantando a un beb Cmo debo utilizar este medicamento? Tome este medicamento por va oral con un vaso de agua. Siga las instrucciones de la etiqueta del [place]. Tmelo con alimentos o con leche para Engineer, structural. Si slo toma este medicamento una vez al da, tmelo por la [place]. No tome ms medicamento que lo indicado. No deje de tomar este medicamento de manera abrupta debido a que podr Copywriter, advertising reaccin severa. Su mdico le indicar la cantidad de Agricultural consultant. Si su mdico desea que [employer], la dosis ser reducida gradualmente para Psychiatric nurse secundarios. Hable con su pediatra para informarse acerca del uso de este medicamento en nios. Puede requerir atencin especial. Sobredosis: Pngase en contacto inmediatamente con un centro toxicolgico o una sala de urgencia si usted cree que haya tomado demasiado medicamento. ATENCIN: Ara Kussmaul es solo  para usted. No comparta este medicamento con nadie. Qu sucede si me olvido de una dosis? Si se olvida una dosis, tmela lo antes posible. Si es casi la hora de la prxima dosis, tome slo esa dosis. No tome dosis adicionales o dobles. Qu puede interactuar con este medicamento? No tome esta medicina con ninguno de los siguientes medicamentos:  metirapona  mifepristona Esta medicina tambin puede interactuar con los siguientes medicamentos:  aminoglutetimida  anfotericina B  aspirina y  medicamentos tipo aspirina  barbitricos  ciertos medicamentos para la diabetes, tales como, glipizida o gliburida  colestiramina  inhibidores de colinesterasa  ciclosporina  digoxina  diurticos  efedrina  hormonas femeninas, como estrgenos y pldoras anticonceptivas  isoniazida  quetoconazol  los AINEs, medicamentos para Conservation officer, historic buildings e inflamacin, como ibuprofeno o naproxeno  fenitona  rifampicina  toxoides  vacunas  warfarina Puede ser que esta lista no menciona todas las posibles interacciones. Informe a su profesional de [employer]	[place] de [employer] productos a base de hierbas, medicamentos de [place] o suplementos nutritivos que est tomando. Si usted fuma, consume bebidas alcohlicas o si utiliza drogas ilegales, indqueselo tambin a su profesional de [employer]	[place]. Algunas sustancias pueden interactuar con su medicamento. A qu debo estar atento al usar [employer]? Visite a su mdico o a su profesional de la salud para que revise regularmente su evolucin. Si est [employer] por mucho tiempo, lleve una tarjeta de identificacin con su nombre y direccin, el tipo y la dosis del [place], y Academic librarian y la direccin de su mdico. [place] medicamento puede aumentar su riesgo de contraer una infeccin. Informe a su mdico o a su profesional de la salud si est expuesto a cualquier persona con sarampin o varicela, o si desarrolla llagas o ampollas que no sanan correctamente. Si le Lucianne Lei a Publishing copy, informe a su mdico o profesional de la salud que ha tomado este [employer] ltimos doce meses. Consulte a su mdico o a su profesional de la salud acerca de su dieta. Es posible que deba disminuir la cantidad de sal que come. Este medicamento puede aumentar los niveles de Dispensing optician. Pregntele a su profesional de la salud si es Chartered loss adjuster cambios en la dieta o en los medicamentos si usted tiene diabetes. Qu efectos secundarios  puedo tener al [employer] este medicamento? Efectos secundarios que debe informar a su mdico o a Barrister's clerk de la salud tan pronto como sea posible: Chief of Staff, como erupcin cutnea, comezn/picazn o urticaria, e hinchazn de la cara, los labios o la lengua cambios en las emociones o el estado de nimo dolor ocular signos y sntomas de niveles altos de Location manager en la sangre, tales como tener ms sed o apetito, o tener que orinar con mayor frecuencia que lo habitual. Tambin puede sentirse muy cansado o tener visin borrosa. signos y sntomas de infeccin, tales como fiebre o escalofros, tos, dolor de garganta, Social research officer, government o dificultad para orinar crecimiento lento en nios (si se [place] por periodos ms prolongados) hinchazn de tobillos, pies dificultad para dormir huesos dbiles (si se [place] por periodos ms prolongados) Efectos secundarios que generalmente no requieren atencin mdica (infrmelos a su mdico o a su profesional de la salud si persisten o si son molestos): nuseas problemas de piel, acn, piel delgada y Education officer, community aumento de peso Puede ser que esta lista no menciona todos los posibles efectos secundarios. Comunquese a su mdico por asesoramiento mdico [employer].  Usted puede informar los efectos secundarios a la FDA por telfono al 1-800-FDA-1088. Dnde debo guardar mi medicina? Mantngala fuera del alcance de los nios. [place]dela a [employer], entre 15 y 30 grados C (33 y 75 grados F). Mantenga el envase bien cerrado. Deseche todo el medicamento que no haya utilizado, despus de la fecha de vencimiento. ATENCIN: Este folleto es un resumen. Puede ser que no cubra toda la posible informacin. Si usted tiene preguntas acerca de esta medicina, consulte con su mdico, su farmacutico o su profesional de Radiographer, therapeutic.  2020 Elsevier/Gold Standard (2018-01-30 00:00:00)

## 2019-04-16 NOTE — Progress Notes (Signed)
Assessment and Plan:  Estephan was seen today for rash.  Diagnoses and all orders for this visit:  Allergic dermatitis Widespread; prednisone taper sent in; will use shorter course due to poorly controlled diabetic; follow up to reevaluate if needed Can use benadryl, calamine lotion, thoroughly clean all instruments and clothing that may have had contact during yardwork, contact precautions discussed.  Follow up if any worsening of sx or not improving Go to the ER if any chest pain, shortness of breath, nausea, dizziness, severe HA, changes vision/speech -     predniSONE (DELTASONE) 20 MG tablet; 2 tablets daily for 3 days, 1 tablet daily for 4 days.  Further disposition pending results of labs. Discussed med's effects and SE's.   Over 15 minutes of exam, counseling, chart review, and critical decision making was performed.   Future Appointments  Date Time Provider Artas  06/01/2019 10:00 AM Unk Pinto, MD [id number] None    ------------------------------------------------------------------------------------------------------------------   HPI BP 110/70   Pulse 83   Temp 97.7 F (36.5 C)   Wt 159 lb 3.2 oz (72.2 kg)   SpO2 98%   BMI 26.90 kg/m   54 y.o.male with hx of point ivy allergy and T2DM presents for evaluation due to onset of bilateral arm and neck erythema, swelling and itching since yesterday morning. He reports was recently working in the yard moving brush. No distinct rash, lesions. Denies other accompaniments, fever/chills, dizziness, nausea, dyspnea, cough, arthralgias/myalgias. No new medications, new lotions, detergents, products. Denies notable spread since appearing yesterday.   He has been applying old script of topical prescription steroid cream (unsure what) which has helped some with itching. Cooling extremities has been helpful as well.   Poorly controlled T2DM:  Lab Results  Component Value Date   HGBA1C 9.4 (H) 02/12/2019     Past  Medical History:  Diagnosis Date  . Diabetes mellitus without complication ([place])   . Hyperlipidemia   . Vitamin D deficiency      Allergies  Allergen Reactions  . Poison Ivy Extract [Poison Ivy Extract] Rash    Current Outpatient Medications on File Prior to Visit  Medication Sig  . aspirin EC 81 MG tablet Take 81 mg by mouth daily.  . Cholecalciferol 1.25 MG (50000 UT) capsule Take one tablet by mouth three days a week for twelve weeks.  Marland Kitchen glipiZIDE (GLUCOTROL) 5 MG tablet Take 1/2 to 1 tablet 3 x   /day before Meals for Diabetes  . metFORMIN (GLUCOPHAGE-XR) 500 MG 24 hr tablet Take 2 tablets 2 x /Day with Meals for Diabetes  . rosuvastatin (CRESTOR) 40 MG tablet Take 1 tablet (40 mg total) by mouth daily.   No current facility-administered medications on file prior to visit.    ROS: all negative except above.   Physical Exam:  BP 110/70   Pulse 83   Temp 97.7 F (36.5 C)   Wt 159 lb 3.2 oz (72.2 kg)   SpO2 98%   BMI 26.90 kg/m   General Appearance: Well nourished, in no apparent distress. Eyes: PERRLA, conjunctiva no swelling or erythema ENT/Mouth: Ext aud canals clear, TMs without erythema, bulging. No erythema, swelling, or exudate on post pharynx.  Tonsils not swollen or erythematous. Hearing normal.  Neck: Supple Respiratory: Respiratory effort normal, BS equal bilaterally without rales, rhonchi, wheezing or stridor.  Cardio: RRR with no MRGs. Brisk peripheral pulses without edema.  Abdomen: Soft, + BS.  Non tender. Lymphatics: Non tender without lymphadenopathy.  Musculoskeletal: No obvious deformity,  normal gait.  Skin: Warm, dry; he has erythma and mild swelling of bil arms from wrist to mid upper arm medially, sparing dorsal surfaces; similar around anterior neck, sparing chest/back/face.  Neuro: Cranial nerves intact. Normal muscle tone. Psych: Awake and oriented X 3, normal affect, Insight and Judgment appropriate.     Dan Maker, NP 2:44  PM [facility]

## 2019-06-01 ENCOUNTER — Encounter: Payer: 59 | Admitting: Internal Medicine

## 2019-06-02 ENCOUNTER — Encounter: Payer: 59 | Admitting: Internal Medicine

## 2019-07-05 ENCOUNTER — Encounter: Payer: Self-pay | Admitting: Internal Medicine

## 2019-07-05 NOTE — Progress Notes (Signed)
Annual  Screening/Preventative Visit  & Comprehensive Evaluation & Examination     This very nice 54 y.o. married [name] presents for a Screening /Preventative Visit & comprehensive evaluation and management of multiple medical co-morbidities.  Patient has been followed for HTN, HLD, T2_NIDDM  and Vitamin D Deficiency.     Patient has been followed expectantly with labile HTN.  Patient's BP has been controlled at home.  Today's BP is at at goal - 116/76. Patient denies any cardiac symptoms as chest pain, palpitations, shortness of breath, dizziness or ankle swelling.     Patient's hyperlipidemia is controlled with diet and Rosuvastatin. Patient denies myalgias or other medication SE's. Last lipids were at goal except elevated Trig's:  Lab Results  Component Value Date   CHOL 139 02/12/2019   HDL 46 02/12/2019   LDLCALC 69 02/12/2019   TRIG 162 (H) 02/12/2019   CHOLHDL 3.0 02/12/2019       Patient has hx/o T2_NIDDM (A1c 6.7% / 2013)  and patient denies reactive hypoglycemic symptoms, visual blurring, diabetic polys or paresthesias. Patient admits poor dietary compliance and last A1c was not at goal and his MF was increased from 3 to 4 tabs and his Glipizide from 2 to 3 tabs Daily.  He alleges CBG's 4 x /week usually range in the 130's mg%. :  Lab Results  Component Value Date   HGBA1C 9.4 (H) 02/12/2019        Finally, patient has history of Vitamin D Deficiency  ("24" / 2013 and "24" / 2016)  and last vitamin D was still very low (goal = 70-100):  Lab Results  Component Value Date   VD25OH 20 (L) 02/12/2019    Current Outpatient Medications on File Prior to Visit  Medication Sig  . aspirin EC 81 MG tablet Take 81 mg by mouth daily.  Marland Kitchen glipiZIDE (GLUCOTROL) 5 MG tablet Take 1/2 to 1 tablet 3 x   /day before Meals for Diabetes  . metFORMIN (GLUCOPHAGE-XR) 500 MG 24 hr tablet Take 2 tablets 2 x /Day with Meals for Diabetes  . rosuvastatin (CRESTOR) 40 MG tablet Take 1  tablet (40 mg total) by mouth daily.  Marland Kitchen VITAMIN D PO Take 5,000 Units by mouth. Takes 2 to 3 times a week.  . Cholecalciferol 1.25 MG (50000 UT) capsule Take one tablet by mouth three days a week for twelve weeks.   No current facility-administered medications on file prior to visit.   Allergies  Allergen Reactions  . Poison Ivy Extract [Poison Ivy Extract] Rash   Past Medical History:  Diagnosis Date  . Diabetes mellitus without complication (HCC)   . Hyperlipidemia   . Vitamin D deficiency    Health Maintenance  Topic Date Due  . COVID-19 Vaccine (1) Never done  . OPHTHALMOLOGY EXAM  05/29/2017  . URINE MICROALBUMIN  04/25/2019  . HEMOGLOBIN A1C  08/12/2019  . INFLUENZA VACCINE  08/23/2019  . Fecal DNA (Cologuard)  04/18/2020  . FOOT EXAM  07/04/2020  . TETANUS/TDAP  12/10/2021  . PNEUMOCOCCAL POLYSACCHARIDE VACCINE AGE 58-64 HIGH RISK  Completed  . Hepatitis C Screening  Completed  . HIV Screening  Completed   Immunization History  Administered Date(s) Administered  . Influenza Inj Mdck Quad With Preservative 11/05/2017, 11/06/2018  . Influenza Split 12/29/2013  . Influenza, Seasonal, Injecte, Preservative Fre 11/11/2015  . PPD Test 12/29/2012, 12/29/2013, 01/26/2015, 02/01/2016, 03/29/2017, 04/25/2018, 07/06/2019  . Pneumococcal Polysaccharide-23 12/11/2011, 03/29/2017  . Tdap 12/11/2011   04/19/2017 -  Cologard -Negative -Recc 3 year f/u due Apr 2022  Past Surgical History:  Procedure Laterality Date  . CATARACT EXTRACTION Left 09/2016  . LITHOTRIPSY  2002   Family History  Problem Relation Age of Onset  . Cancer Mother   . Diabetes Father    Social History   Socioeconomic History  . Marital status: Single    Spouse name: Not on file  . Number of children: Not on file  . Years of education: Not on file  . Highest education level: Not on file  Occupational History  . Not on file  Tobacco Use  . Smoking status: Never Smoker  . Smokeless tobacco: Never  Used  Substance and Sexual Activity  . Alcohol use: Yes    Comment: rarely drinks  . Drug use: No  . Sexual activity: Not on file   ROS Constitutional: Denies fever, chills, weight loss/gain, headaches, insomnia,  night sweats or change in appetite. Does c/o fatigue. Eyes: Denies redness, blurred vision, diplopia, discharge, itchy or watery eyes.  ENT: Denies discharge, congestion, post nasal drip, epistaxis, sore throat, earache, hearing loss, dental pain, Tinnitus, Vertigo, Sinus pain or snoring.  Cardio: Denies chest pain, palpitations, irregular heartbeat, syncope, dyspnea, diaphoresis, orthopnea, PND, claudication or edema Respiratory: denies cough, dyspnea, DOE, pleurisy, hoarseness, laryngitis or wheezing.  Gastrointestinal: Denies dysphagia, heartburn, reflux, water brash, pain, cramps, nausea, vomiting, bloating, diarrhea, constipation, hematemesis, melena, hematochezia, jaundice or hemorrhoids Genitourinary: Denies dysuria, frequency,discharge, hematuria or flank pain. Has urgency, nocturia x 2-3 & occasional hesitancy. Musculoskeletal: Denies arthralgia, myalgia, stiffness, Jt. Swelling, pain, limp or strain/sprain. Denies Falls. Skin: Denies puritis, rash, hives, warts, acne, eczema or change in skin lesion Neuro: No weakness, tremor, incoordination, spasms, paresthesia or pain Psychiatric: Denies confusion, memory loss or sensory loss. Denies Depression. Endocrine: Denies change in weight, skin, hair change, nocturia, and paresthesia, diabetic polys, visual blurring or hyper / hypo glycemic episodes.  Heme/Lymph: No excessive bleeding, bruising or enlarged lymph nodes.  Physical Exam  BP 116/76   Pulse 72   Temp (!) 97.2 F (36.2 C)   Resp 16   Ht 5\' 4"  (1.626 m)   Wt 154 lb 9.6 oz (70.1 kg)   BMI 26.54 kg/m   General Appearance: Well nourished and well groomed and in no apparent distress.  Eyes: PERRLA, EOMs, conjunctiva no swelling or erythema, normal fundi and  vessels. Sinuses: No frontal/maxillary tenderness ENT/Mouth: EACs patent / TMs  nl. Nares clear without erythema, swelling, mucoid exudates. Oral hygiene is good. No erythema, swelling, or exudate. Tongue normal, non-obstructing. Tonsils not swollen or erythematous. Hearing normal.  Neck: Supple, thyroid not palpable. No bruits, nodes or JVD. Respiratory: Respiratory effort normal.  BS equal and clear bilateral without rales, rhonci, wheezing or stridor. Cardio: Heart sounds are normal with regular rate and rhythm and no murmurs, rubs or gallops. Peripheral pulses are normal and equal bilaterally without edema. No aortic or femoral bruits. Chest: symmetric with normal excursions and percussion.  Abdomen: Soft, with Nl bowel sounds. Nontender, no guarding, rebound, hernias, masses, or organomegaly.  Lymphatics: Non tender without lymphadenopathy.  Musculoskeletal: Full ROM all peripheral extremities, joint stability, 5/5 strength, and normal gait. Skin: Warm and dry without rashes, lesions, cyanosis, clubbing or  ecchymosis.  Neuro: Cranial nerves intact, reflexes equal bilaterally. Normal muscle tone, no cerebellar symptoms. Sensation intact to touch, vibratory and Monofilament to the toes bilaterally.  Pysch: Alert and oriented X 3 with normal affect, insight and judgment appropriate.   Assessment and  Plan  1. Annual Preventative/Screening Exam   2. Labile hypertension  - EKG 12-Lead - [place], RETROPERITNL ABD,  LTD - Urinalysis, Routine w reflex microscopic - Microalbumin / creatinine urine ratio - CBC with Differential/Platelet - COMPLETE METABOLIC PANEL WITH GFR - Magnesium - TSH  3. Hyperlipidemia associated with type 2 diabetes mellitus (HCC)  - EKG 12-Lead - [place], RETROPERITNL ABD,  LTD - Lipid panel - TSH  4. Vitamin D deficiency  -Recc increase 5,000 units 3 x /week to Daily pending today's labs  - VITAMIN D 25 Hydroxy  5. Type 2 diabetes mellitus with hyperlipidemia  (HCC)  - EKG 12-Lead - [place], RETROPERITNL ABD,  LTD - Urinalysis, Routine w reflex microscopic - Microalbumin / creatinine urine ratio - HM DIABETES FOOT EXAM - LOW EXTREMITY NEUR EXAM DOCUM - Lipid panel - Hemoglobin A1c - Insulin, random  6. BPH with obstruction/lower urinary tract symptoms  - Urinalysis, Routine w reflex microscopic - PSA  7. Screening-pulmonary TB  - TB Skin Test  8. Screening for colorectal cancer  - POC Hemoccult Bld/Stl   9. Prostate cancer screening  - PSA  10. Screening for ischemic heart disease  - EKG 12-Lead  11. Screening for AAA (aortic abdominal aneurysm)  - [place], RETROPERITNL ABD,  LTD - Iron,Total/Total Iron Binding Cap - Vitamin B12  12. Fatigue  - Testosterone - CBC with Differential/Platelet - TSH  13. Medication management  - Urinalysis, Routine w reflex microscopic - Microalbumin / creatinine urine ratio - CBC with Differential/Platelet - COMPLETE METABOLIC PANEL WITH GFR - Magnesium - Lipid panel - TSH - Hemoglobin A1c - Insulin, random - VITAMIN D 25 Hydroxy        Patient was counseled in prudent diet, weight control to achieve/maintain BMI less than 25, BP monitoring, regular exercise and medications as discussed.  Discussed med effects and SE's. Routine screening labs and tests as requested with regular follow-up as recommended. Over 40 minutes of exam, counseling, chart review and high complex critical decision making was performed   Marinus Maw, MD

## 2019-07-05 NOTE — Patient Instructions (Signed)

## 2019-07-06 ENCOUNTER — Other Ambulatory Visit: Payer: Self-pay

## 2019-07-06 ENCOUNTER — Ambulatory Visit: Payer: 59 | Admitting: Internal Medicine

## 2019-07-06 VITALS — BP 116/76 | HR 72 | Temp 97.2°F | Resp 16 | Ht 64.0 in | Wt 154.6 lb

## 2019-07-06 DIAGNOSIS — N138 Other obstructive and reflux uropathy: Secondary | ICD-10-CM

## 2019-07-06 DIAGNOSIS — R0989 Other specified symptoms and signs involving the circulatory and respiratory systems: Secondary | ICD-10-CM

## 2019-07-06 DIAGNOSIS — E785 Hyperlipidemia, unspecified: Secondary | ICD-10-CM

## 2019-07-06 DIAGNOSIS — Z111 Encounter for screening for respiratory tuberculosis: Secondary | ICD-10-CM | POA: Diagnosis not present

## 2019-07-06 DIAGNOSIS — Z136 Encounter for screening for cardiovascular disorders: Secondary | ICD-10-CM

## 2019-07-06 DIAGNOSIS — N401 Enlarged prostate with lower urinary tract symptoms: Secondary | ICD-10-CM

## 2019-07-06 DIAGNOSIS — E559 Vitamin D deficiency, unspecified: Secondary | ICD-10-CM

## 2019-07-06 DIAGNOSIS — E1169 Type 2 diabetes mellitus with other specified complication: Secondary | ICD-10-CM

## 2019-07-06 DIAGNOSIS — Z0001 Encounter for general adult medical examination with abnormal findings: Secondary | ICD-10-CM

## 2019-07-06 DIAGNOSIS — R5383 Other fatigue: Secondary | ICD-10-CM

## 2019-07-06 DIAGNOSIS — Z0000 Encounter for general adult medical examination without abnormal findings: Secondary | ICD-10-CM | POA: Diagnosis not present

## 2019-07-06 DIAGNOSIS — Z1211 Encounter for screening for malignant neoplasm of colon: Secondary | ICD-10-CM

## 2019-07-06 DIAGNOSIS — Z125 Encounter for screening for malignant neoplasm of prostate: Secondary | ICD-10-CM

## 2019-07-06 DIAGNOSIS — Z79899 Other long term (current) drug therapy: Secondary | ICD-10-CM

## 2019-07-07 LAB — COMPLETE METABOLIC PANEL WITH GFR
AG Ratio: 1.8 (calc) (ref 1.0–2.5)
ALT: 19 U/L (ref 9–46)
AST: 15 U/L (ref 10–35)
Albumin: 4.7 g/dL (ref 3.6–5.1)
Alkaline phosphatase (APISO): 84 U/L (ref 35–144)
BUN: 12 mg/dL (ref 7–25)
CO2: 27 mmol/L (ref 20–32)
Calcium: 9.9 mg/dL (ref 8.6–10.3)
Chloride: 103 mmol/L (ref 98–110)
Creat: 0.78 mg/dL (ref 0.70–1.33)
GFR, Est African American: 120 mL/min/{1.73_m2} (ref >=60)
GFR, Est Non African American: 104 mL/min/{1.73_m2} (ref >=60)
Globulin: 2.6 g/dL (calc) (ref 1.9–3.7)
Glucose, Bld: 215 mg/dL — ABNORMAL HIGH (ref 65–99)
Potassium: 4.6 mmol/L (ref 3.5–5.3)
Sodium: 138 mmol/L (ref 135–146)
Total Bilirubin: 0.4 mg/dL (ref 0.2–1.2)
Total Protein: 7.3 g/dL (ref 6.1–8.1)

## 2019-07-07 LAB — URINALYSIS, ROUTINE W REFLEX MICROSCOPIC
Bilirubin Urine: NEGATIVE
Hgb urine dipstick: NEGATIVE
Ketones, ur: NEGATIVE
Leukocytes,Ua: NEGATIVE
Nitrite: NEGATIVE
Protein, ur: NEGATIVE
Specific Gravity, Urine: 1.01 (ref 1.001–1.03)
pH: 5.5 (ref 5.0–8.0)

## 2019-07-07 LAB — MICROALBUMIN / CREATININE URINE RATIO
Creatinine, Urine: 30 mg/dL (ref 20–320)
Microalb Creat Ratio: 10 mcg/mg creat (ref <30)
Microalb, Ur: 0.3 mg/dL

## 2019-07-07 LAB — CBC WITH DIFFERENTIAL/PLATELET
Absolute Monocytes: 423 cells/uL (ref 200–950)
Basophils Absolute: 59 cells/uL (ref 0–200)
Basophils Relative: 0.9 %
Eosinophils Absolute: 59 cells/uL (ref 15–500)
Eosinophils Relative: 0.9 %
HCT: 50.4 % — ABNORMAL HIGH (ref 38.5–50.0)
Hemoglobin: 17 g/dL (ref 13.2–17.1)
Lymphs Abs: 1521 cells/uL (ref 850–3900)
MCH: 30.8 pg (ref 27.0–33.0)
MCHC: 33.7 g/dL (ref 32.0–36.0)
MCV: 91.3 fL (ref 80.0–100.0)
MPV: 12.1 fL (ref 7.5–12.5)
Monocytes Relative: 6.5 %
Neutro Abs: 4440 cells/uL (ref 1500–7800)
Neutrophils Relative %: 68.3 %
Platelets: 196 10*3/uL (ref 140–400)
RBC: 5.52 10*6/uL (ref 4.20–5.80)
RDW: 12.9 % (ref 11.0–15.0)
Total Lymphocyte: 23.4 %
WBC: 6.5 10*3/uL (ref 3.8–10.8)

## 2019-07-07 LAB — HEMOGLOBIN A1C
Hgb A1c MFr Bld: 7.9 % of total Hgb — ABNORMAL HIGH (ref <5.7)
Mean Plasma Glucose: 180 (calc)
eAG (mmol/L): 10 (calc)

## 2019-07-07 LAB — LIPID PANEL
Cholesterol: 223 mg/dL — ABNORMAL HIGH (ref <200)
HDL: 52 mg/dL (ref >=40)
LDL Cholesterol (Calc): 141 mg/dL (calc) — ABNORMAL HIGH
Non-HDL Cholesterol (Calc): 171 mg/dL (calc) — ABNORMAL HIGH (ref <130)
Total CHOL/HDL Ratio: 4.3 (calc) (ref <5.0)
Triglycerides: 161 mg/dL — ABNORMAL HIGH (ref <150)

## 2019-07-07 LAB — TESTOSTERONE: Testosterone: 212 ng/dL — ABNORMAL LOW (ref 250–827)

## 2019-07-07 LAB — PSA: PSA: 0.5 ng/mL (ref <=4.0)

## 2019-07-07 LAB — IRON, TOTAL/TOTAL IRON BINDING CAP
%SAT: 21 % (calc) (ref 20–48)
Iron: 81 ug/dL (ref 50–180)
TIBC: 378 mcg/dL (calc) (ref 250–425)

## 2019-07-07 LAB — TSH: TSH: 1.52 mIU/L (ref 0.40–4.50)

## 2019-07-07 LAB — VITAMIN D 25 HYDROXY (VIT D DEFICIENCY, FRACTURES): Vit D, 25-Hydroxy: 24 ng/mL — ABNORMAL LOW (ref 30–100)

## 2019-07-07 LAB — MAGNESIUM: Magnesium: 2.3 mg/dL (ref 1.5–2.5)

## 2019-07-07 LAB — VITAMIN B12: Vitamin B-12: 513 pg/mL (ref 200–1100)

## 2019-07-07 LAB — INSULIN, RANDOM: Insulin: 19.8 u[IU]/mL — ABNORMAL HIGH

## 2019-07-08 ENCOUNTER — Encounter: Payer: Self-pay | Admitting: *Deleted

## 2019-07-11 ENCOUNTER — Other Ambulatory Visit: Payer: Self-pay | Admitting: Internal Medicine

## 2019-07-11 DIAGNOSIS — E119 Type 2 diabetes mellitus without complications: Secondary | ICD-10-CM

## 2019-10-08 ENCOUNTER — Encounter: Payer: Self-pay | Admitting: Adult Health

## 2019-10-08 ENCOUNTER — Other Ambulatory Visit: Payer: Self-pay

## 2019-10-08 ENCOUNTER — Ambulatory Visit: Payer: 59 | Admitting: Adult Health

## 2019-10-08 VITALS — BP 126/88 | HR 80 | Temp 97.7°F | Wt 156.0 lb

## 2019-10-08 DIAGNOSIS — Z6826 Body mass index (BMI) 26.0-26.9, adult: Secondary | ICD-10-CM

## 2019-10-08 DIAGNOSIS — R0989 Other specified symptoms and signs involving the circulatory and respiratory systems: Secondary | ICD-10-CM

## 2019-10-08 DIAGNOSIS — E119 Type 2 diabetes mellitus without complications: Secondary | ICD-10-CM | POA: Diagnosis not present

## 2019-10-08 DIAGNOSIS — E559 Vitamin D deficiency, unspecified: Secondary | ICD-10-CM | POA: Diagnosis not present

## 2019-10-08 DIAGNOSIS — H11001 Unspecified pterygium of right eye: Secondary | ICD-10-CM

## 2019-10-08 DIAGNOSIS — E1169 Type 2 diabetes mellitus with other specified complication: Secondary | ICD-10-CM | POA: Diagnosis not present

## 2019-10-08 DIAGNOSIS — E785 Hyperlipidemia, unspecified: Secondary | ICD-10-CM

## 2019-10-08 MED ORDER — METFORMIN HCL ER 500 MG PO TB24: ORAL_TABLET | ORAL | 1 refills | Status: DC

## 2019-10-08 NOTE — Progress Notes (Signed)
FOLLOW UP  Assessment and Plan:   Hypertension Controlled by lifestyle Monitor blood pressure at home; patient to call if consistently greater than 130/80 Continue DASH diet.   Reminder to go to the ER if any CP, SOB, nausea, dizziness, severe HA, changes vision/speech, left arm numbness and tingling and jaw pain.  Cholesterol Currently above goal; taking rosuvastatin 40 mg but only BID; pending labs will likely need to increase to daily.  LDL goal is <70 Continue low cholesterol diet and exercise.  Check lipid panel.   Diabetes with diabetic chronic kidney disease Continue medication: metformin 4 tabs daily, glipizide 5 mg - TID Continue diet and exercise.  Perform daily foot/skin check, notify office of any concerning changes.  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 Further disposition pending results of labs. Discussed med's effects and SE's.   Over 30 minutes of exam, counseling, chart review, and critical decision making was performed.   Future Appointments  Date Time Provider Department Center  10/08/2019 10:30 AM Judd Gaudier, NP [facility] None  01/07/2020  2:30 PM [name], MD [facility] None  07/06/2020 11:00 AM [name], MD [facility] None     ----------------------------------------------------------------------------------------------------------------------  HPI 54 y.o. male  presents for 3 month follow up on hypertension, cholesterol, diabetes, weight and vitamin D deficiency.   BMI is Body mass index is 26.78 kg/m., he has been working on diet and exercise; he has cut out white bread, soda, switched to whole wheat/whole grains.  He eats fresh fruit (oranges). He uses splenda for sweetener.  He tries to walk/run at least 2-3 times a week.   Wt Readings from Last 3 Encounters:  10/08/19 156 lb (70.8 kg)  07/06/19 154 lb 9.6 oz (70.1 kg)  04/16/19 159 lb 3.2 oz (72.2 kg)   His blood pressure has been controlled at home, today their BP is BP: 126/88  He does workout. He denies chest pain, shortness of breath, dizziness.   He is on cholesterol medication (on rosuvastatin 40 mg, taking twice only on Tues/Thurs) and denies myalgias. His cholesterol is not at goal. The cholesterol last visit was:   Lab Results  Component Value Date   CHOL 223 (H) 07/06/2019   HDL 52 07/06/2019   LDLCALC 141 (H) 07/06/2019   TRIG 161 (H) 07/06/2019   CHOLHDL 4.3 07/06/2019    He has been working on diet and exercise for T2 diabetes, and denies foot ulcerations, increased appetite, nausea, paresthesia of the feet, polydipsia, polyuria, visual disturbances, vomiting and weight loss. He checks a fasting glucose intermittently and reports runs he reports typically in 130-140 range.  He is currently taking metformin 500 mg taking 2000 mg, glipizide 5 mg -reports taking TID. Last A1C in the office was:  Lab Results  Component Value Date   HGBA1C 7.9 (H) 07/06/2019    Patient is on Vitamin D supplement, takes 5000 IU but  admits forgets , only taking 2-3 days a week   Lab Results  Component Value Date   VD25OH 24 (L) 07/06/2019       Current Medications:  Current Outpatient Medications on File Prior to Visit  Medication Sig  . aspirin  EC 81 MG tablet Take 81 mg by mouth daily.  Marland Kitchen glipiZIDE (GLUCOTROL) 5 MG tablet TAKE 1/2 - 1 tablet    3 x /day with Meals for Diabetes  . metFORMIN (GLUCOPHAGE-XR) 500 MG 24 hr tablet Take 2 tablets 2 x /Day with Meals for Diabetes  . rosuvastatin (CRESTOR) 40 MG tablet Take 1 tablet (40 mg total) by mouth daily.  Marland Kitchen VITAMIN D PO Take 5,000 Units by mouth. Takes 2 to 3 times a week.   No current facility-administered medications on file prior to visit.     Allergies:  Allergies  Allergen Reactions  . Poison Ivy Extract [Poison Ivy Extract] Rash     Medical History:  Past Medical History:  Diagnosis Date  . Diabetes mellitus without complication (HCC)   . Hyperlipidemia   . Vitamin D deficiency    Family history- Reviewed and unchanged Social history- Reviewed and unchanged   Review of Systems:  Review of Systems  Constitutional: Negative for malaise/fatigue and weight loss.  HENT: Negative for hearing loss and tinnitus.   Eyes: Negative for blurred vision and double vision.  Respiratory: Negative for cough, shortness of breath and wheezing.   Cardiovascular: Negative for chest pain, palpitations, orthopnea, claudication and leg swelling.  Gastrointestinal: Negative for abdominal pain, blood in stool, constipation, diarrhea, heartburn, melena, nausea and vomiting.  Genitourinary: Negative.   Musculoskeletal: Negative for joint pain and myalgias.  Skin: Negative for rash.  Neurological: Negative for dizziness, tingling, sensory change, weakness and headaches.  Endo/Heme/Allergies: Negative for polydipsia.  Psychiatric/Behavioral: Negative.   All other systems reviewed and are negative.    Physical Exam: BP 126/88   Pulse 80   Temp 97.7 F (36.5 C)   Wt 156 lb (70.8 kg)   SpO2 97%   BMI 26.78 kg/m  Wt Readings from Last 3 Encounters:  10/08/19 156 lb (70.8 kg)  07/06/19 154 lb 9.6 oz (70.1 kg)  04/16/19 159 lb 3.2 oz (72.2 kg)   General Appearance: Well  nourished, in no apparent distress. Eyes: PERRLA, EOMs, L conjunctiva no swelling or erythema, R mildly erythematous without discharge; mild pterygium noted Sinuses: No Frontal/maxillary tenderness ENT/Mouth: Ext aud canals clear, TMs without erythema, bulging. No erythema, swelling, or exudate on post pharynx.  Tonsils not swollen or erythematous. Hearing normal.  Neck: Supple, thyroid normal.  Respiratory: Respiratory effort normal, BS equal bilaterally without rales, rhonchi, wheezing or stridor.  Cardio: RRR with no MRGs. Brisk peripheral pulses without edema.  Abdomen: Soft, + BS.  Non tender, no guarding, rebound, hernias, masses. Lymphatics: Non tender without lymphadenopathy.  Musculoskeletal: Full ROM, 5/5 strength, Normal gait. He has R thumb DIP amputation.  Skin: Warm, dry without rashes, lesions, ecchymosis.  Neuro: Cranial nerves intact. No cerebellar symptoms.  Psych: Awake and oriented X 3, normal affect, Insight and Judgment appropriate.    Dan Maker, NP 10:13 AM [facility]

## 2019-10-08 NOTE — Patient Instructions (Addendum)
Goals    . Exercise 150 min/wk Moderate Activity    . HEMOGLOBIN A1C < 7.0    . LDL CALC < 70      Please schedule appointment with eye doctor for diabetic eye exam and pterygium    Dieta rica en fibra High-Fiber Diet La fibra, tambin llamada fibra dietaria, es un tipo de carbohidrato que se encuentra en las frutas, las verduras, los cereales integrales y los frijoles. Una dieta rica en fibra puede tener muchos beneficios para la salud. El mdico puede recomendar una dieta rica en fibra para ayudar a:  Chief Strategy Officer. La fibra puede hacer que defeque con ms frecuencia.  Disminuir el nivel de colesterol.  Aliviar las siguientes afecciones: ? Hinchazn de la venas en el ano (hemorroides). ? Hinchazn e irritacin (inflamacin) de zonas especficas del tracto digestivo (diverticulitis sin complicaciones). ? Un problema del intestino grueso (colon) que, a veces, causa dolor y diarrea (sndrome de colon irritable, SCI).  Evitar comer en exceso como parte de un plan para bajar de peso.  Evitar la enfermedad cardaca, la diabetes tipo 2 y ciertos cnceres. En qu consiste el plan? El consumo diario recomendado de fibra en gramos (g) incluye lo siguiente:  38g para hombres de 50 aos o menos.  30g para los hombres [address] de [place].  25g para mujeres de 50 aos o menos.  21g para las [employer] de [place]. Puede recibir la ingesta diaria recomendada de fibra dietaria de la siguiente manera:  Coma una variedad de frutas, verduras, granos y frijoles.  Tome un suplemento de Lead, si no es posible comer suficiente fibra en su dieta. Qu debo saber acerca de la dieta rica en fibra?  Es mejor obtener fibra directamente de los alimentos en lugar de recibirla de suplementos de Impact. No hay demasiada investigacin acerca de qu tan efectivos son los suplementos.  Verifique siempre el contenido de fibra en la etiqueta de informacin nutricional de los alimentos  preenvasados. Busque alimentos que contengan 5g o ms de fibra por porcin.  Hable con un especialista en alimentacin y nutricin (nutricionista) si tiene preguntas sobre alimentos especficos que se recomiendan o no para su afeccin mdica, especialmente si aquellos alimentos no estn detallados a continuacin.  Aumente gradualmente la cantidad de fibra que consume. Si aumenta demasiado rpido el consumo de fibra dietaria puede provocar distensin abdominal, clicos o gases.  Beba abundante agua. El [place] a Geophysicist/field seismologist. Cules son algunos consejos para seguir este plan?  Consuma una gran variedad de alimentos ricos en fibra.  Asegrese de que al [employer] mitad de los granos que ingiere cada da sean cereales integrales.  Coma panes y cereales hechos con harina de cereales integrales en lugar de harina refinada o blanca.  Coma arroz integral, trigo burgol o mijo en lugar de Surveyor, minerals con un desayuno rico en fibra, como un cereal que contenga al menos 5g de fibra o ms por porcin.  Use frijoles en lugar de carne en las sopas, ensaladas o platos de pastas.  Coma colaciones ricas en fibra, como frutos rojos, verduras crudas, frutos secos y palomitas de maz.  Elija frutas y verduras Environmental consultant de formas procesadas como jugo o salsa. Qu alimentos puedo comer?  Frutas Frutos rojos. Peras. Manzanas. Naranjas. [place]. Ciruelas y pasas. Higos secos. Verduras Batatas. Espinaca. Col rizada. Alcachofas. Repollo. Brcoli. Coliflor. Guisantes. Zanahorias. [place]aza. Cereales Panes integrales. Cereal multigrano. Avena. Arroz integral. Gypsy Decant. Sharion Dove  burgol. Mijo. Quinua. Magdalenas de salvado. Palomitas de maz. Galletas de centeno. Carnes y otras protenas Frijoles blancos, colorados y pintos. Soja. Guisantes secos. Lentejas. Frutos secos y semillas. Lcteos Yogur fortificado con Research scientist (life sciences). Bebidas Leche de soja fortificada con Bjorn Loser. Jugo de naranja  fortificado con Bjorn Loser. Otros alimentos Barras de [place]. Es posible que los productos mencionados arriba no formen una lista completa de las bebidas y los alimentos recomendados. Comunquese con un nutricionista para conocer ms opciones. Qu alimentos no se recomiendan? Frutas Jugo de frutas. Frutas cocidas coladas. Verduras Papas fritas. Verduras enlatadas. Verduras muy cocidas. Cereales Pan blanco. Pastas hechas con Webb Laws. Arroz Melburn Popper y otras protenas Cortes de carne con grasa. Pollo o pescado fritos. Lcteos Leche. Yogur. Queso crema. [employer]. Grasas y [employer]. Bebidas Gaseosas. Otros alimentos Tortas y pasteles. Es posible que los productos que se enumeran ms arriba no sean una lista completa de los alimentos y las bebidas que se Theatre stage manager. Comunquese con un nutricionista para obtener ms informacin. Resumen  La fibra es un tipo de carbohidrato. Se encuentra en las frutas, las verduras, los cereales integrales y los frijoles.  Una dieta rica en fibra representa muchos beneficios para la salud, como evitar el estreimiento, Copy colesterol en la [place], ayudar a perder peso y reducir el riesgo de enfermedad cardaca, diabetes y ciertos tipos de cncer.  Aumente gradualmente la ingesta de fibra. El aumento demasiado rpido puede provocar clicos, distensin abdominal y gases. Beba mucha agua a la vez que [employer].  Las mejores fuentes de fibra incluyen frutas y verduras enteras, granos integrales, frutos secos, semillas y frijoles. Esta informacin no tiene Theme park manager el consejo del mdico. Asegrese de hacerle al mdico cualquier pregunta que tenga. Document Revised: 04/20/2017 Document Reviewed: 12/28/2016 Elsevier Patient Education  2020 [employer].

## 2019-10-09 ENCOUNTER — Encounter: Payer: Self-pay | Admitting: *Deleted

## 2019-10-09 LAB — CBC WITH DIFFERENTIAL/PLATELET
Absolute Monocytes: 429 cells/uL (ref 200–950)
Basophils Absolute: 69 cells/uL (ref 0–200)
Basophils Relative: 1.3 %
Eosinophils Absolute: 101 cells/uL (ref 15–500)
Eosinophils Relative: 1.9 %
HCT: 49.2 % (ref 38.5–50.0)
Hemoglobin: 16.4 g/dL (ref 13.2–17.1)
Lymphs Abs: 1542 cells/uL (ref 850–3900)
MCH: 30.2 pg (ref 27.0–33.0)
MCHC: 33.3 g/dL (ref 32.0–36.0)
MCV: 90.6 fL (ref 80.0–100.0)
MPV: 12 fL (ref 7.5–12.5)
Monocytes Relative: 8.1 %
Neutro Abs: 3159 cells/uL (ref 1500–7800)
Neutrophils Relative %: 59.6 %
Platelets: 211 10*3/uL (ref 140–400)
RBC: 5.43 10*6/uL (ref 4.20–5.80)
RDW: 12.7 % (ref 11.0–15.0)
Total Lymphocyte: 29.1 %
WBC: 5.3 10*3/uL (ref 3.8–10.8)

## 2019-10-09 LAB — COMPLETE METABOLIC PANEL WITH GFR
AG Ratio: 1.8 (calc) (ref 1.0–2.5)
ALT: 24 U/L (ref 9–46)
AST: 17 U/L (ref 10–35)
Albumin: 4.4 g/dL (ref 3.6–5.1)
Alkaline phosphatase (APISO): 83 U/L (ref 35–144)
BUN: 15 mg/dL (ref 7–25)
CO2: 27 mmol/L (ref 20–32)
Calcium: 9.4 mg/dL (ref 8.6–10.3)
Chloride: 102 mmol/L (ref 98–110)
Creat: 0.74 mg/dL (ref 0.70–1.33)
GFR, Est African American: 123 mL/min/{1.73_m2} (ref >=60)
GFR, Est Non African American: 106 mL/min/{1.73_m2} (ref >=60)
Globulin: 2.5 g/dL (calc) (ref 1.9–3.7)
Glucose, Bld: 225 mg/dL — ABNORMAL HIGH (ref 65–99)
Potassium: 4.5 mmol/L (ref 3.5–5.3)
Sodium: 136 mmol/L (ref 135–146)
Total Bilirubin: 0.4 mg/dL (ref 0.2–1.2)
Total Protein: 6.9 g/dL (ref 6.1–8.1)

## 2019-10-09 LAB — LIPID PANEL
Cholesterol: 218 mg/dL — ABNORMAL HIGH (ref <200)
HDL: 44 mg/dL (ref >=40)
LDL Cholesterol (Calc): 132 mg/dL (calc) — ABNORMAL HIGH
Non-HDL Cholesterol (Calc): 174 mg/dL (calc) — ABNORMAL HIGH (ref <130)
Total CHOL/HDL Ratio: 5 (calc) — ABNORMAL HIGH (ref <5.0)
Triglycerides: 274 mg/dL — ABNORMAL HIGH (ref <150)

## 2019-10-09 LAB — HEMOGLOBIN A1C
Hgb A1c MFr Bld: 8.2 % of total Hgb — ABNORMAL HIGH (ref <5.7)
Mean Plasma Glucose: 189 (calc)
eAG (mmol/L): 10.4 (calc)

## 2019-10-09 LAB — MAGNESIUM: Magnesium: 2.1 mg/dL (ref 1.5–2.5)

## 2019-10-09 LAB — TSH: TSH: 2.11 mIU/L (ref 0.40–4.50)

## 2019-11-03 ENCOUNTER — Other Ambulatory Visit: Payer: Self-pay | Admitting: Adult Health

## 2019-11-03 DIAGNOSIS — E782 Mixed hyperlipidemia: Secondary | ICD-10-CM

## 2019-11-03 LAB — HM DIABETES EYE EXAM

## 2019-11-06 ENCOUNTER — Other Ambulatory Visit: Payer: Self-pay | Admitting: Adult Health

## 2019-11-06 DIAGNOSIS — E782 Mixed hyperlipidemia: Secondary | ICD-10-CM

## 2019-11-09 ENCOUNTER — Other Ambulatory Visit: Payer: Self-pay | Admitting: *Deleted

## 2019-11-09 MED ORDER — ROSUVASTATIN CALCIUM 40 MG PO TABS: 40.0000 mg | ORAL_TABLET | Freq: Every day | ORAL | 1 refills | Status: DC

## 2019-11-24 ENCOUNTER — Encounter: Payer: Self-pay | Admitting: Internal Medicine

## 2020-01-07 ENCOUNTER — Ambulatory Visit: Payer: 59 | Admitting: Internal Medicine

## 2020-02-04 ENCOUNTER — Other Ambulatory Visit: Payer: Self-pay

## 2020-02-04 ENCOUNTER — Encounter: Payer: Self-pay | Admitting: Internal Medicine

## 2020-02-04 ENCOUNTER — Ambulatory Visit: Payer: 59 | Admitting: Internal Medicine

## 2020-02-04 VITALS — BP 118/84 | HR 88 | Temp 97.2°F | Resp 16 | Ht 64.0 in | Wt 151.0 lb

## 2020-02-04 DIAGNOSIS — N181 Chronic kidney disease, stage 1: Secondary | ICD-10-CM

## 2020-02-04 DIAGNOSIS — E1169 Type 2 diabetes mellitus with other specified complication: Secondary | ICD-10-CM | POA: Diagnosis not present

## 2020-02-04 DIAGNOSIS — R0989 Other specified symptoms and signs involving the circulatory and respiratory systems: Secondary | ICD-10-CM | POA: Diagnosis not present

## 2020-02-04 DIAGNOSIS — Z79899 Other long term (current) drug therapy: Secondary | ICD-10-CM

## 2020-02-04 DIAGNOSIS — E785 Hyperlipidemia, unspecified: Secondary | ICD-10-CM

## 2020-02-04 DIAGNOSIS — E119 Type 2 diabetes mellitus without complications: Secondary | ICD-10-CM

## 2020-02-04 DIAGNOSIS — E559 Vitamin D deficiency, unspecified: Secondary | ICD-10-CM

## 2020-02-04 DIAGNOSIS — E1122 Type 2 diabetes mellitus with diabetic chronic kidney disease: Secondary | ICD-10-CM | POA: Diagnosis not present

## 2020-02-04 MED ORDER — GLIPIZIDE 5 MG PO TABS: ORAL_TABLET | ORAL | 1 refills | Status: DC

## 2020-02-04 MED ORDER — ROSUVASTATIN CALCIUM 40 MG PO TABS
ORAL_TABLET | ORAL | 1 refills | Status: DC
Start: 2020-02-04 — End: 2021-01-26

## 2020-02-04 NOTE — Progress Notes (Signed)
History of Present Illness:       This very nice 55 y.o.male presents for 6 month follow up with HTN, HLD, Pre-Diabetes and Vitamin D Deficiency.       Patient is treated for HTN & BP has been controlled at home. Today's BP: 118/84. Patient has had no complaints of any cardiac type chest pain, palpitations, dyspnea / orthopnea / PND, dizziness, claudication, or dependent edema.      Hyperlipidemia is controlled with diet & meds. Patient denies myalgias or other med SE's. Last Lipids were not at goal:  Lab Results  Component Value Date   CHOL 218 (H) 10/08/2019   HDL 44 10/08/2019   LDLCALC 132 (H) 10/08/2019   TRIG 274 (H) 10/08/2019   CHOLHDL 5.0 (H) 10/08/2019    Also, the patient has history of T2_NIDDM and has had no symptoms of reactive hypoglycemia, diabetic polys, paresthesias or visual blurring.  Last A1c was not at goal:  Lab Results  Component Value Date   HGBA1C 8.2 (H) 10/08/2019       Further, the patient also has history of Vitamin D Deficiency and supplements vitamin D without any suspected side-effects. Last vitamin D was still very low:   Lab Results  Component Value Date   VD25OH 24 (L) 07/06/2019    Current Outpatient Medications on File Prior to Visit  Medication Sig  . aspirin EC 81 MG tablet Take 81 mg by mouth daily.  Marland Kitchen glipiZIDE (GLUCOTROL) 5 MG tablet TAKE 1/2 - 1 tablet    3 x /day with Meals for Diabetes  . metFORMIN (GLUCOPHAGE-XR) 500 MG 24 hr tablet Take 2 tablets 2 x /Day with Meals for Diabetes  . rosuvastatin (CRESTOR) 40 MG tablet Take 1 tablet (40 mg total) by mouth daily.  Marland Kitchen VITAMIN D PO Take 5,000 Units by mouth. Takes 2 to 3 times a week.   No current facility-administered medications on file prior to visit.    Allergies  Allergen Reactions  . Poison Ivy Extract [Poison Ivy Extract] Rash    PMHx:   Past Medical History:  Diagnosis Date  . Diabetes mellitus without complication (HCC)   . Hyperlipidemia   . Vitamin D  deficiency     Immunization History  Administered Date(s) Administered  . Influenza Inj Mdck Quad With Preservative 11/05/2017, 11/06/2018  . Influenza Split 12/29/2013  . Influenza, Seasonal, Injecte, Preservative Fre 11/11/2015  . PPD Test 12/29/2012, 12/29/2013, 01/26/2015, 02/01/2016, 03/29/2017, 04/25/2018, 07/06/2019  . Pneumococcal Polysaccharide-23 12/11/2011, 03/29/2017  . Tdap 12/11/2011    Past Surgical History:  Procedure Laterality Date  . CATARACT EXTRACTION Left 09/2016  . LITHOTRIPSY  2002    FHx:    Reviewed / unchanged  SHx:    Reviewed / unchanged   Systems Review:  Constitutional: Denies fever, chills, wt changes, headaches, insomnia, fatigue, night sweats, change in appetite. Eyes: Denies redness, blurred vision, diplopia, discharge, itchy, watery eyes.  ENT: Denies discharge, congestion, post nasal drip, epistaxis, sore throat, earache, hearing loss, dental pain, tinnitus, vertigo, sinus pain, snoring.  CV: Denies chest pain, palpitations, irregular heartbeat, syncope, dyspnea, diaphoresis, orthopnea, PND, claudication or edema. Respiratory: denies cough, dyspnea, DOE, pleurisy, hoarseness, laryngitis, wheezing.  Gastrointestinal: Denies dysphagia, odynophagia, heartburn, reflux, water brash, abdominal pain or cramps, nausea, vomiting, bloating, diarrhea, constipation, hematemesis, melena, hematochezia  or hemorrhoids. Genitourinary: Denies dysuria, frequency, urgency, nocturia, hesitancy, discharge, hematuria or flank pain. Musculoskeletal: Denies arthralgias, myalgias, stiffness, jt. swelling, pain, limping or strain/sprain.  Skin: Denies pruritus, rash, hives, warts, acne, eczema or change in skin lesion(s). Neuro: No weakness, tremor, incoordination, spasms, paresthesia or pain. Psychiatric: Denies confusion, memory loss or sensory loss. Endo: Denies change in weight, skin or hair change.  Heme/Lymph: No excessive bleeding, bruising or enlarged lymph  nodes.  Physical Exam  BP 118/84   Pulse 88   Temp (!) 97.2 F (36.2 C)   Resp 16   Ht 5\' 4"  (1.626 m)   Wt 151 lb (68.5 kg)   SpO2 98%   BMI 25.92 kg/m   Appears  well nourished, well groomed  and in no distress.  Eyes: PERRLA, EOMs, conjunctiva no swelling or erythema. Sinuses: No frontal/maxillary tenderness ENT/Mouth: EAC's clear, TM's nl w/o erythema, bulging. Nares clear w/o erythema, swelling, exudates. Oropharynx clear without erythema or exudates. Oral hygiene is good. Tongue normal, non obstructing. Hearing intact.  Neck: Supple. Thyroid not palpable. Car 2+/2+ without bruits, nodes or JVD. Chest: Respirations nl with BS clear & equal w/o rales, rhonchi, wheezing or stridor.  Cor: Heart sounds normal w/ regular rate and rhythm without sig. murmurs, gallops, clicks or rubs. Peripheral pulses normal and equal  without edema.  Abdomen: Soft & bowel sounds normal. Non-tender w/o guarding, rebound, hernias, masses or organomegaly.  Lymphatics: Unremarkable.  Musculoskeletal: Full ROM all peripheral extremities, joint stability, 5/5 strength and normal gait.  Skin: Warm, dry without exposed rashes, lesions or ecchymosis apparent.  Neuro: Cranial nerves intact, reflexes equal bilaterally. Sensory-motor testing grossly intact. Tendon reflexes grossly intact.  Pysch: Alert & oriented x 3.  Insight and judgement nl & appropriate. No ideations.  Assessment and Plan:  - Continue medication, monitor blood pressure at home.  - Continue DASH diet.  Reminder to go to the ER if any CP,  SOB, nausea, dizziness, severe HA, changes vision/speech.  - Continue diet/meds, exercise,& lifestyle modifications.  - Continue monitor periodic cholesterol/liver & renal functions    - Continue diet, exercise  - Lifestyle modifications.  - Monitor appropriate labs. - Continue supplementation.       Discussed  regular exercise, BP monitoring, weight control to achieve/maintain BMI less than 25  and discussed med and SE's. Recommended labs to assess and monitor clinical status with further disposition pending results of labs.  I discussed the assessment and treatment plan with the patient. The patient was provided an opportunity to ask questions and all were answered. The patient agreed with the plan and demonstrated an understanding of the instructions.  I provided over 30 minutes of exam, counseling, chart review and  complex critical decision making.         The patient was advised to call back or seek an in-person evaluation if the symptoms worsen or if the condition fails to improve as anticipated.   , MD

## 2020-02-04 NOTE — Patient Instructions (Signed)

## 2020-02-05 LAB — COMPLETE METABOLIC PANEL WITH GFR
AG Ratio: 1.8 (calc) (ref 1.0–2.5)
ALT: 17 U/L (ref 9–46)
AST: 15 U/L (ref 10–35)
Albumin: 4.4 g/dL (ref 3.6–5.1)
Alkaline phosphatase (APISO): 85 U/L (ref 35–144)
BUN: 12 mg/dL (ref 7–25)
CO2: 27 mmol/L (ref 20–32)
Calcium: 9.9 mg/dL (ref 8.6–10.3)
Chloride: 100 mmol/L (ref 98–110)
Creat: 0.77 mg/dL (ref 0.70–1.33)
GFR, Est African American: 120 mL/min/{1.73_m2} (ref >=60)
GFR, Est Non African American: 104 mL/min/{1.73_m2} (ref >=60)
Globulin: 2.5 g/dL (calc) (ref 1.9–3.7)
Glucose, Bld: 271 mg/dL — ABNORMAL HIGH (ref 65–99)
Potassium: 4.4 mmol/L (ref 3.5–5.3)
Sodium: 135 mmol/L (ref 135–146)
Total Bilirubin: 0.6 mg/dL (ref 0.2–1.2)
Total Protein: 6.9 g/dL (ref 6.1–8.1)

## 2020-02-05 LAB — INSULIN, RANDOM: Insulin: 22.6 u[IU]/mL — ABNORMAL HIGH

## 2020-02-05 LAB — CBC WITH DIFFERENTIAL/PLATELET
Absolute Monocytes: 390 cells/uL (ref 200–950)
Basophils Absolute: 49 cells/uL (ref 0–200)
Basophils Relative: 0.8 %
Eosinophils Absolute: 43 cells/uL (ref 15–500)
Eosinophils Relative: 0.7 %
HCT: 49 % (ref 38.5–50.0)
Hemoglobin: 16.5 g/dL (ref 13.2–17.1)
Lymphs Abs: 939 cells/uL (ref 850–3900)
MCH: 30.6 pg (ref 27.0–33.0)
MCHC: 33.7 g/dL (ref 32.0–36.0)
MCV: 90.9 fL (ref 80.0–100.0)
MPV: 12.6 fL — ABNORMAL HIGH (ref 7.5–12.5)
Monocytes Relative: 6.4 %
Neutro Abs: 4679 cells/uL (ref 1500–7800)
Neutrophils Relative %: 76.7 %
Platelets: 208 10*3/uL (ref 140–400)
RBC: 5.39 10*6/uL (ref 4.20–5.80)
RDW: 12.8 % (ref 11.0–15.0)
Total Lymphocyte: 15.4 %
WBC: 6.1 10*3/uL (ref 3.8–10.8)

## 2020-02-05 LAB — LIPID PANEL
Cholesterol: 225 mg/dL — ABNORMAL HIGH (ref <200)
HDL: 44 mg/dL (ref >=40)
LDL Cholesterol (Calc): 136 mg/dL (calc) — ABNORMAL HIGH
Non-HDL Cholesterol (Calc): 181 mg/dL (calc) — ABNORMAL HIGH (ref <130)
Total CHOL/HDL Ratio: 5.1 (calc) — ABNORMAL HIGH (ref <5.0)
Triglycerides: 291 mg/dL — ABNORMAL HIGH (ref <150)

## 2020-02-05 LAB — MAGNESIUM: Magnesium: 2 mg/dL (ref 1.5–2.5)

## 2020-02-05 LAB — VITAMIN D 25 HYDROXY (VIT D DEFICIENCY, FRACTURES): Vit D, 25-Hydroxy: 20 ng/mL — ABNORMAL LOW (ref 30–100)

## 2020-02-05 LAB — TSH: TSH: 1.99 mIU/L (ref 0.40–4.50)

## 2020-02-05 LAB — HEMOGLOBIN A1C
Hgb A1c MFr Bld: 9.6 % of total Hgb — ABNORMAL HIGH (ref <5.7)
Mean Plasma Glucose: 229 mg/dL
eAG (mmol/L): 12.7 mmol/L

## 2020-02-07 NOTE — Progress Notes (Signed)
========================================================== ==========================================================  -  Glucose = 275 mg% - Way too high   !  !  ! ( Ideal or Goal is less than 130 mg% )   - and    - A1c - much worse -  Gone up from 8.2%  to now   9.6 %  (Ideal or Goal is less than 5.7% ) ...........................................................................................................................  Please be sure taking your  Metformin  4 tablets / day     and   Recommend INCREASE Your Glipizide 5 mg to 3 x /day                                                       ( But only take if having a meal ) ..........................................................................................................................  - Being diabetic has a  300% increased risk for heart attack,  stroke, cancer, and alzheimer- type vascular dementia.   - It is very important  that you work harder with diet by  avoiding all foods that are white except chicken,   fish & calliflower.  - Avoid white rice  (brown & wild rice is OK),   - Avoid white potatoes  (sweet potatoes in moderation is OK),   - AVOID White bread or wheat bread or anything made out of   white flour like bagels, donuts, rolls, buns, biscuits, cakes,  - pastries, cookies, pizza crust, and pasta (made from  white flour & egg whites)   - vegetarian pasta or spinach or wheat pasta is OK.  - Multigrain breads like Arnold's, Pepperidge Farm or   multigrain sandwich thins or high fiber breads like   [place] bread or "Dave[employer] Killer" breads that are  4 to 5 grams fiber per slice !  are best.    Diet, exercise and weight loss can reverse and cure  diabetes in the early stages.    - Diet, exercise and weight loss is very important in the   control and prevention of complications of diabetes which  affects every system in your body, ie.   -Brain - dementia/stroke,  - eyes - glaucoma/blindness,   - heart - heart attack/heart failure,  - kidneys - dialysis,  - stomach - gastric paralysis,  - intestines - malabsorption,  - nerves - severe painful neuritis,  - circulation - gangrene & loss of a leg(s)  - and finally  . . . . . . . . . . . . . . . . . .    - cancer and Alzheimers. ========================================================== ==========================================================  -  Vitamin D = 20  is EXTREMELY LOW  and tells me that you are                                                                        NOT taking as recommended   !  - Please take Vitamin D 5,000 units EVERY Day  !  !  ! ========================================================== ==========================================================  -  All Else - CBC - Kidneys - Electrolytes - Liver - Magnesium & Thyroid    - all  Normal /  OK ===========================================================

## 2020-02-11 ENCOUNTER — Encounter: Payer: Self-pay | Admitting: *Deleted

## 2020-04-29 ENCOUNTER — Other Ambulatory Visit: Payer: Self-pay | Admitting: Internal Medicine

## 2020-04-29 MED ORDER — PREDNISONE 10 MG PO TABS: ORAL_TABLET | ORAL | 0 refills | Status: DC

## 2020-05-09 ENCOUNTER — Ambulatory Visit: Payer: 59 | Admitting: Adult Health Nurse Practitioner

## 2020-05-31 ENCOUNTER — Encounter: Payer: 59 | Admitting: Internal Medicine

## 2020-06-29 NOTE — Progress Notes (Deleted)
FOLLOW UP  Assessment and Plan:   Hypertension Controlled by lifestyle Monitor blood pressure at home; patient to call if consistently greater than 130/80 Continue DASH diet.   Reminder to go to the ER if any CP, SOB, nausea, dizziness, severe HA, changes vision/speech, left arm numbness and tingling and jaw pain.  Cholesterol Currently above goal; taking rosuvastatin 40 mg but only twice weekly - increase to daily *** LDL goal is <70 Continue low cholesterol diet and exercise.  Check lipid panel.   Diabetes with diabetic chronic kidney disease Continue medication: metformin 4 tabs daily ***, glipizide 5 mg - TID Continue diet and exercise.  Perform daily foot/skin check, notify office of any concerning changes.  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 Further disposition pending results of labs. Discussed med's effects and SE's.   Over 30 minutes of exam, counseling, chart review, and critical decision making was performed.   Future Appointments  Date Time Provider Department Center  06/30/2020 10:30 AM Judd Gaudier, NP [facility] None  09/09/2020 11:00 AM [name], MD [facility] None    ----------------------------------------------------------------------------------------------------------------------  HPI 55 y.o. male  presents for 3 month  follow up on hypertension, cholesterol, diabetes, weight and vitamin D deficiency.   BMI is There is no height or weight on file to calculate BMI., he has been working on diet and exercise; he has cut out white bread, soda, switched to whole wheat/whole grains.  He eats fresh fruit (oranges). He uses splenda for sweetener.  He tries to walk/run at least 2-3 times a week.   Wt Readings from Last 3 Encounters:  02/04/20 151 lb (68.5 kg)  10/08/19 156 lb (70.8 kg)  07/06/19 154 lb 9.6 oz (70.1 kg)   His blood pressure has been controlled at home, today their BP is    He does workout. He denies chest pain, shortness of breath, dizziness.   He is on cholesterol medication (on rosuvastatin 40 mg, taking twice only on Tues/Thurs ***) and denies myalgias. His cholesterol is not at goal. The cholesterol last visit was:   Lab Results  Component Value Date   CHOL 225 (H) 02/04/2020   HDL 44 02/04/2020   LDLCALC 136 (H) 02/04/2020   TRIG 291 (H) 02/04/2020   CHOLHDL 5.1 (H) 02/04/2020    He has been working on diet and exercise for T2 diabetes, and denies foot ulcerations, increased appetite, nausea, paresthesia of the feet, polydipsia, polyuria, visual disturbances, vomiting and weight loss. He checks a fasting glucose intermittently and reports runs he reports typically in 130-140 range.  He is currently taking metformin 500 mg taking 2000 mg, glipizide 5 mg -reports taking TID. Last A1C in the office was:  Lab Results  Component Value Date   HGBA1C 9.6 (H) 02/04/2020    Patient is on Vitamin D supplement, takes 5000 IU but admits forgets , only taking 2-3 days  a week   Lab Results  Component Value Date   VD25OH 20 (L) 02/04/2020       Current Medications:  Current Outpatient Medications on File Prior to Visit  Medication Sig  . aspirin EC 81 MG tablet Take 81 mg by mouth daily.  Marland Kitchen glipiZIDE (GLUCOTROL) 5 MG tablet TAKE 1 tablet    2 x /day with Meals for Diabetes  . metFORMIN  (GLUCOPHAGE-XR) 500 MG 24 hr tablet Take 2 tablets 2 x /Day with Meals for Diabetes  . predniSONE (DELTASONE) 10 MG tablet 1 tab 3 x day for 3 days, then 1 tab 2 x day for 3 days, then 1 tab 1 x day for 5 days  . rosuvastatin (CRESTOR) 40 MG tablet Take 1 tablet 2 x /week  on Tues & Thurs for Cholesterol  . VITAMIN D PO Take 5,000 Units by mouth. Takes 2 to 3 times a week.   No current facility-administered medications on file prior to visit.     Allergies:  Allergies  Allergen Reactions  . Poison Ivy Extract [Poison Ivy Extract] Rash     Medical History:  Past Medical History:  Diagnosis Date  . Diabetes mellitus without complication (HCC)   . Hyperlipidemia   . Vitamin D deficiency    Family history- Reviewed and unchanged Social history- Reviewed and unchanged   Review of Systems:  Review of Systems  Constitutional: Negative for malaise/fatigue and weight loss.  HENT: Negative for hearing loss and tinnitus.   Eyes: Negative for blurred vision and double vision.  Respiratory: Negative for cough, shortness of breath and wheezing.   Cardiovascular: Negative for chest pain, palpitations, orthopnea, claudication and leg swelling.  Gastrointestinal: Negative for abdominal pain, blood in stool, constipation, diarrhea, heartburn, melena, nausea and vomiting.  Genitourinary: Negative.   Musculoskeletal: Negative for joint pain and myalgias.  Skin: Negative for rash.  Neurological: Negative for dizziness, tingling, sensory change, weakness and headaches.  Endo/Heme/Allergies: Negative for polydipsia.  Psychiatric/Behavioral: Negative.   All other systems reviewed and are negative.    Physical Exam: There were no vitals taken for this visit. Wt Readings from Last 3 Encounters:  02/04/20 151 lb (68.5 kg)  10/08/19 156 lb (70.8 kg)  07/06/19 154 lb 9.6 oz (70.1 kg)   General Appearance: Well nourished, in no apparent distress. Eyes: PERRLA, EOMs, L conjunctiva no swelling or  erythema, R mildly erythematous without discharge; mild pterygium noted Sinuses: No Frontal/maxillary tenderness ENT/Mouth: Ext aud canals clear, TMs without erythema, bulging. No erythema, swelling, or exudate on post pharynx.  Tonsils not swollen or erythematous. Hearing normal.  Neck: Supple, thyroid normal.  Respiratory: Respiratory effort normal, BS equal bilaterally without rales, rhonchi, wheezing or stridor.  Cardio: RRR with no MRGs. Brisk peripheral pulses without edema.  Abdomen: Soft, + BS.  Non tender, no guarding, rebound, hernias, masses. Lymphatics: Non tender without lymphadenopathy.  Musculoskeletal: Full ROM, 5/5 strength, Normal gait. He has R thumb DIP amputation.  Skin: Warm, dry without rashes, lesions, ecchymosis.  Neuro: Cranial nerves intact. No cerebellar symptoms.  Psych: Awake and oriented X 3, normal affect, Insight and Judgment appropriate.    Dan Maker, NP 1:24 PM [facility]

## 2020-06-30 ENCOUNTER — Ambulatory Visit: Payer: 59 | Admitting: Adult Health

## 2020-07-06 ENCOUNTER — Encounter: Payer: 59 | Admitting: Internal Medicine

## 2020-09-08 ENCOUNTER — Encounter: Payer: Self-pay | Admitting: Internal Medicine

## 2020-09-08 NOTE — Patient Instructions (Signed)

## 2020-09-08 NOTE — Progress Notes (Signed)
Annual  Screening/Preventative Visit  & Comprehensive Evaluation & Examination  Future Appointments  Date Time Provider Department Center  09/12/2021 11:00 AM [name], MD [facility] None             This very nice 55 y.o. married [name] presents for a Screening /Preventative Visit & comprehensive evaluation and management of multiple medical co-morbidities.  Patient has been followed for HTN, HLD, T2_NIDDM  and Vitamin D Deficiency.       Patient has been followed expectantly with labile HTN. Patient's BP has been controlled at home.  Today's BP: 116/82. Patient denies any cardiac symptoms as chest pain, palpitations, shortness of breath, dizziness or ankle swelling.       Patient's hyperlipidemia is not controlled with diet and Rosuvastatin was added at last OV.   Patient denies myalgias or other medication SE's. Last lipids were not at goal:   Lab Results  Component Value Date   CHOL 225 (H) 02/04/2020   HDL 44 02/04/2020   LDLCALC 136 (H) 02/04/2020   TRIG 291 (H) 02/04/2020   CHOLHDL 5.1 (H) 02/04/2020         Patient has hx/o T2_NIDDM (A1c 6.7% /2013) and patient denies reactive hypoglycemic symptoms, visual blurring, diabetic polys or paresthesias.   Patient admits poor dietary compliance and last A1c was not at goal:   Lab Results  Component Value Date   HGBA1C 9.6 (H) 02/04/2020          Finally, patient has history of Vitamin D Deficiency ("24" /2013 & "24" /2016) and patient has not taken recommended vitamin D and last vitamin D was still not at goal:   Lab Results  Component Value Date   VD25OH 20 (L) 02/04/2020     Current Outpatient Medications on File Prior to Visit  Medication Sig   aspirin EC 81 MG tablet Take 81 mg by mouth daily.   glipiZIDE 5 MG tablet TAKE 1 tablet    2 x /day with Meals for Diabetes   metFORMIN -XR 500 MG  Take 2 tablets 2 x /Day with Meals for Diabetes   rosuvastatin 40 MG tablet Take 1 tablet 2 x /week  on Tues  & Thurs for Cholesterol   VITAMIN D PO Take 5,000 Units by mouth. Takes 2 to 3 times a week.    Allergies  Allergen Reactions   Poison Ivy Extract [Poison Ivy Extract] Rash    Past Medical History:  Diagnosis Date   Diabetes mellitus without complication (HCC)    Hyperlipidemia    Vitamin D deficiency      Health Maintenance  Topic Date Due   COVID-19 Vaccine (1) Never done   Zoster Vaccines- Shingrix (1 of 2) Never done   Pneumococcal Vaccine 38-71 Years old (3 - PCV) 03/30/2018   Fecal DNA (Cologuard)  04/18/2020   FOOT EXAM  07/04/2020   URINE MICROALBUMIN  07/05/2020   HEMOGLOBIN A1C  08/03/2020   INFLUENZA VACCINE  08/22/2020   OPHTHALMOLOGY EXAM  11/02/2020   TETANUS/TDAP  12/10/2021   PNEUMOCOCCAL POLYSACCHARIDE VACCINE AGE 93-64 HIGH RISK  Completed   Hepatitis C Screening  Completed   HIV Screening  Completed   HPV VACCINES  Aged Out     Immunization History  Administered Date(s) Administered   Influenza Inj Mdck Quad  11/05/2017, 11/06/2018   Influenza 12/29/2013   Influenza, Seasonal 11/11/2015   PPD Test 03/29/2017, 04/25/2018, 07/06/2019   Pneumococcal - 23 12/11/2011, 03/29/2017   Tdap  12/11/2011    04/19/2017 - Cologard -Negative -Recc 3 year f/u due Apr 2022 ( ordering today)   Past Surgical History:  Procedure Laterality Date   CATARACT EXTRACTION Left 09/2016   LITHOTRIPSY  2002     Family History  Problem Relation Age of Onset   Cancer Mother    Diabetes Father     Social History   Socioeconomic History   Marital status: Single    Spouse name: Not on file  Occupational History   Works in Personal assistant services  Tobacco Use   Smoking status: Never   Smokeless tobacco: Never  Substance and Sexual Activity   Alcohol use: Yes    Comment: rarely drinks   Drug use: No   Sexual activity: Not on file      ROS Constitutional: Denies fever, chills, weight loss/gain, headaches, insomnia,  night sweats or change in appetite.  Does c/o fatigue. Eyes: Denies redness, blurred vision, diplopia, discharge, itchy or watery eyes.  ENT: Denies discharge, congestion, post nasal drip, epistaxis, sore throat, earache, hearing loss, dental pain, Tinnitus, Vertigo, Sinus pain or snoring.  Cardio: Denies chest pain, palpitations, irregular heartbeat, syncope, dyspnea, diaphoresis, orthopnea, PND, claudication or edema Respiratory: denies cough, dyspnea, DOE, pleurisy, hoarseness, laryngitis or wheezing.  Gastrointestinal: Denies dysphagia, heartburn, reflux, water brash, pain, cramps, nausea, vomiting, bloating, diarrhea, constipation, hematemesis, melena, hematochezia, jaundice or hemorrhoids Genitourinary: Denies dysuria, frequency, urgency, nocturia, hesitancy, discharge, hematuria or flank pain Musculoskeletal: Denies arthralgia, myalgia, stiffness, Jt. Swelling, pain, limp or strain/sprain. Denies Falls. Skin: Denies puritis, rash, hives, warts, acne, eczema or change in skin lesion Neuro: No weakness, tremor, incoordination, spasms, paresthesia or pain Psychiatric: Denies confusion, memory loss or sensory loss. Denies Depression. Endocrine: Denies change in weight, skin, hair change, nocturia, and paresthesia, diabetic polys, visual blurring or hyper / hypo glycemic episodes.  Heme/Lymph: No excessive bleeding, bruising or enlarged lymph nodes.   Physical Exam  BP 116/82   Pulse 64   Temp (!) 97.3 F (36.3 C)   Resp 16   Ht 5\' 5"  (1.651 m)   Wt 150 lb 9.6 oz (68.3 kg)   SpO2 99%   BMI 25.06 kg/m   General Appearance: Well nourished and well groomed and in no apparent distress.  Eyes: PERRLA, EOMs, conjunctiva no swelling or erythema, normal fundi and vessels. Sinuses: No frontal/maxillary tenderness ENT/Mouth: EACs patent / TMs  nl. Nares clear without erythema, swelling, mucoid exudates. Oral hygiene is good. No erythema, swelling, or exudate. Tongue normal, non-obstructing. Tonsils not swollen or erythematous.  Hearing normal.  Neck: Supple, thyroid not palpable. No bruits, nodes or JVD. Respiratory: Respiratory effort normal.  BS equal and clear bilateral without rales, rhonci, wheezing or stridor. Cardio: Heart sounds are normal with regular rate and rhythm and no murmurs, rubs or gallops. Peripheral pulses are normal and equal bilaterally without edema. No aortic or femoral bruits. Chest: symmetric with normal excursions and percussion.  Abdomen: Soft, with Nl bowel sounds. Nontender, no guarding, rebound, hernias, masses, or organomegaly.  Lymphatics: Non tender without lymphadenopathy.  Musculoskeletal: Full ROM all peripheral extremities, joint stability, 5/5 strength, and normal gait. Skin: Warm and dry without rashes, lesions, cyanosis, clubbing or  ecchymosis.  Neuro: Cranial nerves intact, reflexes equal bilaterally. Normal muscle tone, no cerebellar symptoms. Sensation intact.  Pysch: Alert and oriented X 3 with normal affect, insight and judgment appropriate.   Assessment and Plan  1. Annual Preventative/Screening Exam    2. Labile hypertension  - EKG 12-Lead -  [place], RETROPERITNL ABD,  LTD - Urinalysis, Routine w reflex microscopic - Microalbumin / creatinine urine ratio - CBC with Differential/Platelet - COMPLETE METABOLIC PANEL WITH GFR - Magnesium - TSH  3. Hyperlipidemia associated with type 2 diabetes mellitus (HCC)  - EKG 12-Lead - [place], RETROPERITNL ABD,  LTD - Lipid panel - TSH  4. Type 2 diabetes mellitus with stage 1 chronic kidney  disease, without long-term current use of insulin (HCC)  - EKG 12-Lead - [place], RETROPERITNL ABD,  LTD - HM DIABETES FOOT EXAM - LOW EXTREMITY NEUR EXAM DOCUM - Hemoglobin A1c - Insulin, random  5. Vitamin D deficiency  - VITAMIN D 25 Hydroxy (Vit-D Deficiency, Fractures)  6. Prostate cancer screening  - PSA  7. Screening-pulmonary TB  - TB Skin Test  8. Screening for colorectal cancer  - Cologuard  9. Screening for  ischemic heart disease  - EKG 12-Lead  10. Screening for AAA (aortic abdominal aneurysm)  - [place], RETROPERITNL ABD,  LTD  11. Fatigue, unspecified type  - Iron, Total/Total Iron Binding Cap - Vitamin B12 - Testosterone - CBC with Differential/Platelet - TSH  12. Medication management  - Urinalysis, Routine w reflex microscopic - Microalbumin / creatinine urine ratio - CBC with Differential/Platelet - COMPLETE METABOLIC PANEL WITH GFR - Magnesium - Lipid panel - TSH - Hemoglobin A1c - Insulin, random - VITAMIN D 25 Hydroxy          Patient was counseled in prudent diet, weight control to achieve/maintain BMI less than 25, BP monitoring, regular exercise and medications as discussed.  Discussed med effects and SE's. Routine screening labs and tests as requested with regular follow-up as recommended. Over 40 minutes of exam, counseling, chart review and high complex critical decision making was performed   Marinus Maw, MD

## 2020-09-09 ENCOUNTER — Ambulatory Visit (INDEPENDENT_AMBULATORY_CARE_PROVIDER_SITE_OTHER): Payer: 59 | Admitting: Internal Medicine

## 2020-09-09 ENCOUNTER — Other Ambulatory Visit: Payer: Self-pay

## 2020-09-09 VITALS — BP 116/82 | HR 64 | Temp 97.3°F | Resp 16 | Ht 65.0 in | Wt 150.6 lb

## 2020-09-09 DIAGNOSIS — E559 Vitamin D deficiency, unspecified: Secondary | ICD-10-CM

## 2020-09-09 DIAGNOSIS — Z79899 Other long term (current) drug therapy: Secondary | ICD-10-CM

## 2020-09-09 DIAGNOSIS — E785 Hyperlipidemia, unspecified: Secondary | ICD-10-CM

## 2020-09-09 DIAGNOSIS — Z0000 Encounter for general adult medical examination without abnormal findings: Secondary | ICD-10-CM | POA: Diagnosis not present

## 2020-09-09 DIAGNOSIS — R0989 Other specified symptoms and signs involving the circulatory and respiratory systems: Secondary | ICD-10-CM

## 2020-09-09 DIAGNOSIS — Z0001 Encounter for general adult medical examination with abnormal findings: Secondary | ICD-10-CM

## 2020-09-09 DIAGNOSIS — Z136 Encounter for screening for cardiovascular disorders: Secondary | ICD-10-CM | POA: Diagnosis not present

## 2020-09-09 DIAGNOSIS — Z1211 Encounter for screening for malignant neoplasm of colon: Secondary | ICD-10-CM

## 2020-09-09 DIAGNOSIS — Z125 Encounter for screening for malignant neoplasm of prostate: Secondary | ICD-10-CM

## 2020-09-09 DIAGNOSIS — Z111 Encounter for screening for respiratory tuberculosis: Secondary | ICD-10-CM

## 2020-09-09 DIAGNOSIS — N181 Chronic kidney disease, stage 1: Secondary | ICD-10-CM

## 2020-09-09 DIAGNOSIS — E1122 Type 2 diabetes mellitus with diabetic chronic kidney disease: Secondary | ICD-10-CM

## 2020-09-09 DIAGNOSIS — R5383 Other fatigue: Secondary | ICD-10-CM

## 2020-09-09 DIAGNOSIS — E1169 Type 2 diabetes mellitus with other specified complication: Secondary | ICD-10-CM

## 2020-09-09 NOTE — Progress Notes (Signed)
Aorta Scan < 3 cm 

## 2020-09-10 ENCOUNTER — Other Ambulatory Visit: Payer: Self-pay | Admitting: Adult Health

## 2020-09-10 DIAGNOSIS — E785 Hyperlipidemia, unspecified: Secondary | ICD-10-CM

## 2020-09-10 DIAGNOSIS — E1169 Type 2 diabetes mellitus with other specified complication: Secondary | ICD-10-CM

## 2020-09-10 NOTE — Progress Notes (Signed)
============================================================ ============================================================  -    Iron levels - Normal & OK  ============================================================ ============================================================  -  Vitamin B12 levels are low Normal - Recommends take a Super B supplement  ============================================================ ============================================================  -  PSA - Low - Great  ============================================================ ============================================================  -  Testosterone back in low Normal Range   -  Please take Zinc 50 mg tablet Daily                                                  to help raise Testosterone levels naturally  ============================================================ ============================================================  -  Total Chol = 172     &       LDL Chol = 97      -    both  Excellent   - Very low risk for Heart Attack  / Stroke  - Please continue Rosuvastatin same   ============================================================ ============================================================  -  A1c = 9.5% - Still way too high and poor control   - Strongly encourage better diet    Being diabetic has a  300% increased risk for heart attack,  stroke, cancer, and alzheimer- type vascular dementia.    It is very important that you work harder with diet by  avoiding all foods that are white except chicken,   fish & calliflower.  - Avoid white rice  (brown & wild rice is OK),   - Avoid white potatoes  (sweet potatoes in moderation is OK),   White bread or wheat bread or anything made out of   white flour like bagels, donuts, rolls, buns, biscuits, cakes,  - pastries, cookies, pizza crust, and pasta (made from  white flour & egg whites)   - vegetarian pasta or spinach or wheat  pasta is OK.  - Multigrain breads like Arnold's, Pepperidge Farm or   multigrain sandwich thins or high fiber breads like   [place] bread or "Dave's Killer" breads that are  4 to 5 grams fiber per slice !  are best.    Diet, exercise and weight loss can reverse and cure  diabetes in the early stages.    - Diet, exercise and weight loss is very important in the   control and prevention of complications of diabetes which  affects every system in your body, ie.   -Brain - dementia/stroke,  - eyes - glaucoma/blindness,  - heart - heart attack/heart failure,  - kidneys - dialysis,  - stomach - gastric paralysis,  - intestines - malabsorption,  - nerves - severe painful neuritis,  - circulation - gangrene & loss of a leg(s)  - and finally  . . . . . . . . . . . . . . . . . .    - cancer and Alzheimers. ============================================================ ============================================================  -  Vitamin D = 25 - Dangerously & extremely  low   - High risk for Impotence - Strongly recommend  Take                       Vit D 5,000 unit capsule  x 2 capsules = 10,000 units EVERY DAY  ============================================================ ============================================================  -  All else OK  ============================================================ ============================================================  -

## 2020-09-12 LAB — MICROALBUMIN / CREATININE URINE RATIO
Creatinine, Urine: 98 mg/dL (ref 20–320)
Microalb Creat Ratio: 7 mcg/mg creat (ref <30)
Microalb, Ur: 0.7 mg/dL

## 2020-09-12 LAB — PSA: PSA: 0.71 ng/mL (ref <=4.00)

## 2020-09-12 LAB — CBC WITH DIFFERENTIAL/PLATELET
Absolute Monocytes: 462 cells/uL (ref 200–950)
Basophils Absolute: 63 cells/uL (ref 0–200)
Basophils Relative: 1.1 %
Eosinophils Absolute: 108 cells/uL (ref 15–500)
Eosinophils Relative: 1.9 %
HCT: 48.4 % (ref 38.5–50.0)
Hemoglobin: 16.2 g/dL (ref 13.2–17.1)
Lymphs Abs: 1533 cells/uL (ref 850–3900)
MCH: 30.4 pg (ref 27.0–33.0)
MCHC: 33.5 g/dL (ref 32.0–36.0)
MCV: 90.8 fL (ref 80.0–100.0)
MPV: 12.9 fL — ABNORMAL HIGH (ref 7.5–12.5)
Monocytes Relative: 8.1 %
Neutro Abs: 3534 cells/uL (ref 1500–7800)
Neutrophils Relative %: 62 %
Platelets: 198 10*3/uL (ref 140–400)
RBC: 5.33 10*6/uL (ref 4.20–5.80)
RDW: 12.6 % (ref 11.0–15.0)
Total Lymphocyte: 26.9 %
WBC: 5.7 10*3/uL (ref 3.8–10.8)

## 2020-09-12 LAB — URINALYSIS, ROUTINE W REFLEX MICROSCOPIC
Bilirubin Urine: NEGATIVE
Hgb urine dipstick: NEGATIVE
Ketones, ur: NEGATIVE
Leukocytes,Ua: NEGATIVE
Nitrite: NEGATIVE
Protein, ur: NEGATIVE
Specific Gravity, Urine: 1.027 (ref 1.001–1.035)
pH: 5.5 (ref 5.0–8.0)

## 2020-09-12 LAB — COMPLETE METABOLIC PANEL WITH GFR
AG Ratio: 1.8 (calc) (ref 1.0–2.5)
ALT: 19 U/L (ref 9–46)
AST: 16 U/L (ref 10–35)
Albumin: 4.3 g/dL (ref 3.6–5.1)
Alkaline phosphatase (APISO): 77 U/L (ref 35–144)
BUN: 14 mg/dL (ref 7–25)
CO2: 26 mmol/L (ref 20–32)
Calcium: 9.5 mg/dL (ref 8.6–10.3)
Chloride: 102 mmol/L (ref 98–110)
Creat: 0.81 mg/dL (ref 0.70–1.30)
Globulin: 2.4 g/dL (calc) (ref 1.9–3.7)
Glucose, Bld: 221 mg/dL — ABNORMAL HIGH (ref 65–99)
Potassium: 4.3 mmol/L (ref 3.5–5.3)
Sodium: 135 mmol/L (ref 135–146)
Total Bilirubin: 0.7 mg/dL (ref 0.2–1.2)
Total Protein: 6.7 g/dL (ref 6.1–8.1)
eGFR: 105 mL/min/{1.73_m2} (ref >=60)

## 2020-09-12 LAB — TB SKIN TEST
Induration: 0 mm
TB Skin Test: NEGATIVE

## 2020-09-12 LAB — INSULIN, RANDOM: Insulin: 10.7 u[IU]/mL

## 2020-09-12 LAB — IRON, TOTAL/TOTAL IRON BINDING CAP
%SAT: 44 % (calc) (ref 20–48)
Iron: 146 ug/dL (ref 50–180)
TIBC: 331 mcg/dL (calc) (ref 250–425)

## 2020-09-12 LAB — HEMOGLOBIN A1C
Hgb A1c MFr Bld: 9.5 % of total Hgb — ABNORMAL HIGH (ref <5.7)
Mean Plasma Glucose: 226 mg/dL
eAG (mmol/L): 12.5 mmol/L

## 2020-09-12 LAB — VITAMIN B12: Vitamin B-12: 464 pg/mL (ref 200–1100)

## 2020-09-12 LAB — VITAMIN D 25 HYDROXY (VIT D DEFICIENCY, FRACTURES): Vit D, 25-Hydroxy: 25 ng/mL — ABNORMAL LOW (ref 30–100)

## 2020-09-12 LAB — MAGNESIUM: Magnesium: 2.1 mg/dL (ref 1.5–2.5)

## 2020-09-12 LAB — LIPID PANEL
Cholesterol: 172 mg/dL (ref <200)
HDL: 45 mg/dL (ref >=40)
LDL Cholesterol (Calc): 97 mg/dL (calc)
Non-HDL Cholesterol (Calc): 127 mg/dL (calc) (ref <130)
Total CHOL/HDL Ratio: 3.8 (calc) (ref <5.0)
Triglycerides: 210 mg/dL — ABNORMAL HIGH (ref <150)

## 2020-09-12 LAB — TESTOSTERONE: Testosterone: 387 ng/dL (ref 250–827)

## 2020-09-12 LAB — TSH: TSH: 2.19 mIU/L (ref 0.40–4.50)

## 2020-10-07 LAB — COLOGUARD: Cologuard: NEGATIVE

## 2020-10-08 NOTE — Progress Notes (Signed)
============================================================ -   Test results slightly outside the reference range are not unusual. If there is anything important, I will review this with you,  otherwise it is considered normal test values.  If you have further questions,  please do not hesitate to contact me at the office or via My Chart.  ============================================================ ============================================================  -  Cologard  -  Negative  -   Excellent  !  - Recommend repeat in 3 years

## 2020-10-19 ENCOUNTER — Ambulatory Visit: Payer: 59 | Admitting: Adult Health

## 2020-10-19 ENCOUNTER — Encounter: Payer: Self-pay | Admitting: Adult Health

## 2020-10-19 ENCOUNTER — Other Ambulatory Visit: Payer: Self-pay

## 2020-10-19 VITALS — BP 112/70 | HR 88 | Temp 97.3°F | Wt 150.0 lb

## 2020-10-19 DIAGNOSIS — Z1152 Encounter for screening for COVID-19: Secondary | ICD-10-CM | POA: Diagnosis not present

## 2020-10-19 DIAGNOSIS — J029 Acute pharyngitis, unspecified: Secondary | ICD-10-CM

## 2020-10-19 LAB — POC COVID19 BINAXNOW: SARS Coronavirus 2 Ag: NEGATIVE

## 2020-10-19 NOTE — Progress Notes (Signed)
Assessment and Plan:  Victor Mcintyre was seen today for sore throat and ear pain.  Diagnoses and all orders for this visit:  Acute viral pharyngitis Fairly benign exam, improving on day 6, suggestive of viral URI Discussed the importance of avoiding unnecessary antibiotic therapy. Suggested symptomatic OTC remedies. Pharyngitis: take medicationss as prescribed, increase fluids, throat lozenges, Salt water gargles. Flonase with autoinflation if needed for ear - serous otitis If symptoms do not improve in 3-4 days or get worse contact the office   Encounter for screening for COVID-19 -     POC COVID-19 - negative   Further disposition pending results of labs. Discussed med's effects and SE's.   Over 15 minutes of exam, counseling, chart review, and critical decision making was performed.   Future Appointments  Date Time Provider Department Center  12/23/2020 10:30 AM Revonda Humphrey, NP [facility] None  03/23/2021  9:30 AM [name], MD [facility] None  09/11/2021 11:00 AM [name], MD [facility] None    ------------------------------------------------------------------------------------------------------------------   HPI BP 112/70   Pulse 88   Temp (!) 97.3 F (36.3 C)   Wt 150 lb (68 kg)   SpO2 99%   BMI 24.96 kg/m  55 y.o.male with T2DM presents for evaluation of sore throat and left ear pressure. Negative covid 19 prior to evaluation today.   He reports 6 days ago started devloping mild sore throat, progressive, took tylenol with some improvement, denies severe pain that limited fluid intake. Worst day 4-5 but seems improved today. Has noted left ear pressure, some tenderness in left neck. Has taken menthol throat drops with benefit.   He reports mild nasal congestion. Denies HA, fever/chills, denies cough.   Denies seasonal allergies, only with dust. Not recently.  Denies sick contacts.    Past Medical History:  Diagnosis Date   Diabetes mellitus without  complication (HCC)    Hyperlipidemia    Vitamin D deficiency      Allergies  Allergen Reactions   Poison Ivy Extract [Poison Ivy Extract] Rash    Current Outpatient Medications on File Prior to Visit  Medication Sig   aspirin EC 81 MG tablet Take 81 mg by mouth daily.   glipiZIDE (GLUCOTROL) 5 MG tablet TAKE 1 tablet    2 x /day with Meals for Diabetes   metFORMIN (GLUCOPHAGE-XR) 500 MG 24 hr tablet TAKE 2 TABLETS BY MOUTH TWICE DAILY WITH MEALS FOR DIABETES   rosuvastatin (CRESTOR) 40 MG tablet Take 1 tablet 2 x /week  on Tues & Thurs for Cholesterol   VITAMIN D PO Take 5,000 Units by mouth. Takes 2 to 3 times a week.   No current facility-administered medications on file prior to visit.    ROS: all negative except above.   Physical Exam:  BP 112/70   Pulse 88   Temp (!) 97.3 F (36.3 C)   Wt 150 lb (68 kg)   SpO2 99%   BMI 24.96 kg/m   General Appearance: Well nourished, well dressed adult male, in no apparent distress. 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 No crusty discharge.   Sinuses: No Frontal/maxillary tenderness ENT/Mouth: Ext aud canals clear, TMs without erythema, bulging. Very mild erythema post pharynx without swelling, or exudate on post pharynx.  Tonsils not swollen or erythematous. Hearing normal. No TMJ tenderness.  Neck: Supple, thyroid normal.  Respiratory: Respiratory effort normal, BS equal bilaterally without rales, rhonchi, wheezing or stridor.  Cardio: RRR with no  MRGs. Brisk peripheral pulses without edema.  Lymphatics: Without lymphadenopathy, some mild tenderness left anterior chain.   Musculoskeletal: normal gait.  Skin: Warm, dry without rashes, lesions, ecchymosis.  Neuro: Cranial nerves intact. Normal muscle tone Psych: Awake and oriented X 3, normal affect, Insight and Judgment appropriate.     Dan Maker, NP 2:51 PM [facility]

## 2020-10-19 NOTE — Patient Instructions (Signed)
-  Make sure you are drinking plenty of fluids to stay hydrated.    -while drinking fluids pinch and hold nose close and swallow, to help open eustachian tubes and drain fluid behind ear drums. -you can do salt water gargles. - 1/2-1 tsp in 1 cup of water. Gargle every 1-2 hours as needed for throat pain.   If you are not feeling better in 10-14 days, then please call the office.    Pharyngitis Pharyngitis is redness, pain, and swelling (inflammation) of your pharynx.   CAUSES   Pharyngitis is usually caused by infection. Most of the time, these infections are from viruses (viral) and are part of a cold. However, sometimes pharyngitis is caused by bacteria (bacterial). Pharyngitis can also be caused by allergies. Viral pharyngitis may be spread from person to person by coughing, sneezing, and personal items or utensils (cups, forks, spoons, toothbrushes). Bacterial pharyngitis may be spread from person to person by more intimate contact, such as kissing.   SIGNS AND SYMPTOMS   Symptoms of pharyngitis include:   Sore throat.   Tiredness (fatigue).   Low-grade fever.   Headache. Joint pain and muscle aches. Skin rashes. Swollen lymph nodes. Plaque-like film on throat or tonsils (often seen with bacterial pharyngitis). DIAGNOSIS   Your health care provider will ask you questions about your illness and your symptoms. Your medical history, along with a physical exam, is often all that is needed to diagnose pharyngitis. Sometimes, a rapid strep test is done. Other lab tests may also be done, depending on the suspected cause.   TREATMENT   Viral pharyngitis will usually get better in 3-4 days without the use of medicine. Bacterial pharyngitis is treated with medicines that kill germs (antibiotics).   HOME CARE INSTRUCTIONS   Drink enough water and fluids to keep your urine clear or pale yellow.   Only take over-the-counter or prescription medicines as directed by your health care provider:   If  you are prescribed antibiotics, make sure you finish them even if you start to feel better.   Do not take aspirin.   Get lots of rest.   Gargle with 8 oz of salt water ( tsp of salt per 1 qt of water) as often as every 1-2 hours to soothe your throat.   Throat lozenges (if you are not at risk for choking) or sprays may be used to soothe your throat. SEEK MEDICAL CARE IF:   You have large, tender lumps in your neck. You have a rash. You cough up green, yellow-brown, or bloody spit. SEEK IMMEDIATE MEDICAL CARE IF:   Your neck becomes stiff. You drool or are unable to swallow liquids. You vomit or are unable to keep medicines or liquids down. You have severe pain that does not go away with the use of recommended medicines. You have trouble breathing (not caused by a stuffy nose). MAKE SURE YOU:   Understand these instructions. Will watch your condition. Will get help right away if you are not doing well or get worse. Document Released: 01/08/2005 Document Revised: 10/29/2012 Document Reviewed: 09/15/2012 [facility] Patient Information 2015 [place], [place]. This information is not intended to replace advice given to you by your health care provider. Make sure you discuss any questions you have with your health care provider.

## 2020-12-19 ENCOUNTER — Other Ambulatory Visit: Payer: Self-pay | Admitting: Adult Health

## 2020-12-19 DIAGNOSIS — E785 Hyperlipidemia, unspecified: Secondary | ICD-10-CM

## 2020-12-22 ENCOUNTER — Other Ambulatory Visit: Payer: Self-pay | Admitting: Internal Medicine

## 2020-12-22 DIAGNOSIS — E119 Type 2 diabetes mellitus without complications: Secondary | ICD-10-CM

## 2020-12-22 NOTE — Progress Notes (Deleted)
FOLLOW UP  Assessment and Plan:   Hypertension Controlled by lifestyle Monitor blood pressure at home; patient to call if consistently greater than 130/80 Continue DASH diet.   Reminder to go to the ER if any CP, SOB, nausea, dizziness, severe HA, changes vision/speech, left arm numbness and tingling and jaw pain.  Cholesterol Currently above goal; taking rosuvastatin 40 mg but only BID; pending labs will likely need to increase to daily.  LDL goal is <70 Continue low cholesterol diet and exercise.  Check lipid panel.   Diabetes with diabetic chronic kidney disease Continue medication: metformin 4 tabs daily, glipizide 5 mg - TID Continue diet and exercise.  Perform daily foot/skin check, notify office of any concerning changes.  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 Further disposition pending results of labs. Discussed med's effects and SE's.   Over 30 minutes of exam, counseling, chart review, and critical decision making was performed.   Future Appointments  Date Time Provider Department Center  12/23/2020 10:30 AM Revonda Humphrey, NP [facility] None  03/23/2021  9:30 AM [name], MD [facility] None  09/11/2021 11:00 AM [name], MD [facility] None     ----------------------------------------------------------------------------------------------------------------------  HPI 55 y.o. male  presents for 3 month follow up on hypertension, cholesterol, diabetes, weight and vitamin D deficiency.   BMI is There is no height or weight on file to calculate BMI., he has been working on diet and exercise; he has cut out white bread, soda, switched to whole wheat/whole grains.  He eats fresh fruit (oranges). He uses splenda for sweetener.  He tries to walk/run at least 2-3 times a week.   Wt Readings from Last 3 Encounters:  10/19/20 150 lb (68 kg)  09/09/20 150 lb 9.6 oz (68.3 kg)  02/04/20 151 lb (68.5 kg)   His blood pressure has been controlled at home, today their BP is    He does workout. He denies chest pain, shortness of breath, dizziness.   He is on cholesterol medication (on rosuvastatin 40 mg, taking twice only on Tues/Thurs) and denies myalgias. His cholesterol is not at goal. The cholesterol last visit was:   Lab Results  Component Value Date   CHOL 172 09/09/2020   HDL 45 09/09/2020   LDLCALC 97 09/09/2020   TRIG 210 (H) 09/09/2020   CHOLHDL 3.8 09/09/2020    He has been working on diet and exercise for T2 diabetes, and denies foot ulcerations, increased appetite, nausea, paresthesia of the feet, polydipsia, polyuria, visual disturbances, vomiting and weight loss. He checks a fasting glucose intermittently and reports runs he reports typically in 130-140 range.  He is currently taking metformin 500 mg taking 2000 mg, glipizide 5 mg -reports taking TID. Last A1C in the office was:  Lab Results  Component Value Date   HGBA1C 9.5 (H) 09/09/2020    Patient is on Vitamin D supplement, takes  5000 IU but admits forgets , only taking 2-3 days a week   Lab Results  Component Value Date   VD25OH 25 (L) 09/09/2020       Current Medications:  Current Outpatient Medications on File Prior to Visit  Medication Sig   aspirin EC 81  MG tablet Take 81 mg by mouth daily.   glipiZIDE (GLUCOTROL) 5 MG tablet TAKE 1 TABLET BY MOUTH TWICE DAILY WITH MEALS FOR DIABETES   metFORMIN (GLUCOPHAGE-XR) 500 MG 24 hr tablet Take  2 tablets  2 x /day  with Meals for Diabetes                                                  /                     TAKE 2 TABLETS BY MOUTH   rosuvastatin (CRESTOR) 40 MG tablet Take 1 tablet 2 x /week  on Tues & Thurs for Cholesterol   VITAMIN D PO Take 5,000 Units by mouth. Takes 2 to 3 times a week.   No current facility-administered medications on file prior to visit.     Allergies:  Allergies  Allergen Reactions   Poison Ivy Extract [Poison Ivy Extract] Rash     Medical History:  Past Medical History:  Diagnosis Date   Diabetes mellitus without complication (HCC)    Hyperlipidemia    Vitamin D deficiency    Family history- Reviewed and unchanged Social history- Reviewed and unchanged   Review of Systems:  Review of Systems  Constitutional:  Negative for malaise/fatigue and weight loss.  HENT:  Negative for hearing loss and tinnitus.   Eyes:  Negative for blurred vision and double vision.  Respiratory:  Negative for cough, shortness of breath and wheezing.   Cardiovascular:  Negative for chest pain, palpitations, orthopnea, claudication and leg swelling.  Gastrointestinal:  Negative for abdominal pain, blood in stool, constipation, diarrhea, heartburn, melena, nausea and vomiting.  Genitourinary: Negative.   Musculoskeletal:  Negative for joint pain and myalgias.  Skin:  Negative for rash.  Neurological:  Negative for dizziness, tingling, sensory change, weakness and headaches.  Endo/Heme/Allergies:  Negative for polydipsia.  Psychiatric/Behavioral: Negative.    All other systems reviewed and are negative.   Physical Exam: There were no vitals taken for this visit. Wt Readings from Last 3 Encounters:  10/19/20 150 lb (68 kg)  09/09/20 150 lb 9.6 oz (68.3 kg)  02/04/20 151 lb (68.5  kg)   General Appearance: Well nourished, in no apparent distress. Eyes: PERRLA, EOMs, L conjunctiva no swelling or erythema, R mildly erythematous without discharge; mild pterygium noted Sinuses: No Frontal/maxillary tenderness ENT/Mouth: Ext aud canals clear, TMs without erythema, bulging. No erythema, swelling, or exudate on post pharynx.  Tonsils not swollen or erythematous. Hearing normal.  Neck: Supple, thyroid normal.  Respiratory: Respiratory effort normal, BS equal bilaterally without rales, rhonchi, wheezing or stridor.  Cardio: RRR with no MRGs. Brisk peripheral pulses without edema.  Abdomen: Soft, + BS.  Non tender, no guarding, rebound, hernias, masses. Lymphatics: Non tender without lymphadenopathy.  Musculoskeletal: Full ROM, 5/5 strength, Normal gait. He has R thumb DIP amputation.  Skin: Warm, dry without rashes, lesions, ecchymosis.  Neuro: Cranial nerves intact. No cerebellar symptoms.  Psych: Awake and oriented X 3, normal affect, Insight and Judgment  appropriate.    Revonda Humphrey, NP 4:28 PM [facility]

## 2020-12-23 ENCOUNTER — Ambulatory Visit: Payer: 59 | Admitting: Nurse Practitioner

## 2020-12-23 DIAGNOSIS — E559 Vitamin D deficiency, unspecified: Secondary | ICD-10-CM

## 2020-12-23 DIAGNOSIS — E1169 Type 2 diabetes mellitus with other specified complication: Secondary | ICD-10-CM

## 2020-12-23 DIAGNOSIS — E663 Overweight: Secondary | ICD-10-CM

## 2020-12-23 DIAGNOSIS — R0989 Other specified symptoms and signs involving the circulatory and respiratory systems: Secondary | ICD-10-CM

## 2020-12-23 DIAGNOSIS — E1122 Type 2 diabetes mellitus with diabetic chronic kidney disease: Secondary | ICD-10-CM

## 2020-12-23 DIAGNOSIS — Z79899 Other long term (current) drug therapy: Secondary | ICD-10-CM

## 2021-01-24 NOTE — Progress Notes (Signed)
FOLLOW UP  Assessment and Plan:   Hypertension Controlled by lifestyle Monitor blood pressure at home; patient to call if consistently greater than 130/80 Continue DASH diet.   Reminder to go to the ER if any CP, SOB, nausea, dizziness, severe HA, changes vision/speech, left arm numbness and tingling and jaw pain. Check CBC  Cholesterol Currently above goal; taking rosuvastatin 40 mg but only BIW; pending labs will likely need to increase to daily.  LDL goal is <70 Continue low cholesterol diet and exercise.  Check lipid panel.  Check CMP  Diabetes with diabetic chronic kidney disease Continue medication: metformin 4 tabs daily, glipizide 5 mg - TID Continue diet and exercise.  Perform daily foot/skin check, notify office of any concerning changes.  Check A1C  BMI 24  Continue diet heavy in fruits and veggies and low in animal meats, cheeses, and dairy products, appropriate calorie intake Discussed ideal weight for height  Will follow up in 3 months  Vitamin D Def Below goal at last visit; admits forgetting to take regularly but will restart continue supplementation to maintain goal of 60-100 defer Vit D level  Itching of penis/jock itch Diflucan 150 mg once a week x 2 weeks GC/Chlamydia RNA urine to rule out STI    Continue diet and meds as discussed. Further disposition pending results of labs. Discussed med's effects and SE's.   Over 30 minutes of exam, counseling, chart review, and critical decision making was performed.   Future Appointments  Date Time Provider Department Center  03/23/2021  9:30 AM Lucky CowboyMcKeown, William, MD [facility] None  09/11/2021 11:00 AM Lucky CowboyMcKeown, William, MD [facility] None    ----------------------------------------------------------------------------------------------------------------------  HPI 56 y.o. male  presents for 3 month follow up on hypertension, cholesterol, diabetes, weight and vitamin D deficiency.   He has been having  itching in penis x 1 month. Has been using hydrocortisone with no relief. States he does not notice any discharge, does have some scaly flaking of skin around the head of the penis. Had this previously and Dr. Oneta RackMcKeown prescribed Diflucan and it relieved symptoms  BMI is Body mass index is 24.73 kg/m., he has been working on diet and exercise; he has cut out white bread, soda, switched to whole wheat/whole grains.  He eats fresh fruit (oranges). He uses splenda for sweetener.  He tries to walk/run at least 2-3 times a week.   Wt Readings from Last 3 Encounters:  01/26/21 148 lb 9.6 oz (67.4 kg)  10/19/20 150 lb (68 kg)  09/09/20 150 lb 9.6 oz (68.3 kg)   His blood pressure has been controlled at home, today their BP is BP: 108/62 BP Readings from Last 3 Encounters:  01/26/21 108/62  10/19/20 112/70  09/09/20 116/82     He does workout. He denies chest pain, shortness of breath, dizziness.   He is on cholesterol medication (on rosuvastatin 40 mg, taking twice only on Tues/Thurs) and denies myalgias. His cholesterol is not at goal. The cholesterol last visit was:   Lab Results  Component Value Date   CHOL 172 09/09/2020   HDL 45 09/09/2020   LDLCALC 97 09/09/2020   TRIG 210 (H) 09/09/2020   CHOLHDL 3.8 09/09/2020    He has been working on diet and exercise for T2 diabetes, and denies foot ulcerations, increased appetite, nausea, paresthesia of the feet, polydipsia, polyuria, visual disturbances, vomiting and weight loss. He checks a fasting glucose intermittently and reports runs he reports typically in 130-140 range.  He is currently  taking metformin 500 mg taking 2000 mg, glipizide 5 mg -reports taking TID. Last A1C in the office was:  Lab Results  Component Value Date   HGBA1C 9.5 (H) 09/09/2020    Patient is on Vitamin D supplement, takes 5000 IU but admits forgets , only taking 2-3 days a week   Lab Results  Component Value Date   VD25OH 25 (L) 09/09/2020       Current  Medications:  Current Outpatient Medications on File Prior to Visit  Medication Sig   aspirin EC 81 MG tablet Take 81 mg by mouth daily.   glipiZIDE (GLUCOTROL) 5 MG tablet TAKE 1 TABLET BY MOUTH TWICE DAILY WITH MEALS FOR DIABETES   metFORMIN (GLUCOPHAGE-XR) 500 MG 24 hr tablet Take  2 tablets  2 x /day  with Meals for Diabetes                                                  /                     TAKE 2 TABLETS BY MOUTH   rosuvastatin (CRESTOR) 40 MG tablet Take 1 tablet 2 x /week  on Tues & Thurs for Cholesterol   VITAMIN D PO Take 5,000 Units by mouth. Takes 2 to 3 times a week.   No current facility-administered medications on file prior to visit.     Allergies:  Allergies  Allergen Reactions   Poison Ivy Extract [Poison Ivy Extract] Rash     Medical History:  Past Medical History:  Diagnosis Date   Diabetes mellitus without complication (HCC)    Hyperlipidemia    Vitamin D deficiency    Family history- Reviewed and unchanged Social history- Reviewed and unchanged   Review of Systems:  Review of Systems  Constitutional:  Negative for malaise/fatigue and weight loss.  HENT:  Negative for hearing loss and tinnitus.   Eyes:  Negative for blurred vision and double vision.  Respiratory:  Negative for cough, shortness of breath and wheezing.   Cardiovascular:  Negative for chest pain, palpitations, orthopnea, claudication and leg swelling.  Gastrointestinal:  Negative for abdominal pain, blood in stool, constipation, diarrhea, heartburn, melena, nausea and vomiting.  Genitourinary: Negative.  Negative for dysuria and urgency.       Itching of skin around the head of penis  Musculoskeletal:  Negative for joint pain and myalgias.  Skin:  Negative for rash.  Neurological:  Negative for dizziness, tingling, sensory change, weakness and headaches.  Endo/Heme/Allergies:  Negative for polydipsia.  Psychiatric/Behavioral: Negative.    All other systems reviewed and are  negative.   Physical Exam: BP 108/62    Pulse 80    Temp 97.7 F (36.5 C)    Wt 148 lb 9.6 oz (67.4 kg)    SpO2 98%    BMI 24.73 kg/m  Wt Readings from Last 3 Encounters:  01/26/21 148 lb 9.6 oz (67.4 kg)  10/19/20 150 lb (68 kg)  09/09/20 150 lb 9.6 oz (68.3 kg)   General Appearance: Well nourished, in no apparent distress. Eyes: PERRLA, EOMs, L conjunctiva no swelling or erythema, R mildly erythematous without discharge; mild pterygium noted Sinuses: No Frontal/maxillary tenderness ENT/Mouth: Ext aud canals clear, TMs without erythema, bulging. No erythema, swelling, or exudate on post pharynx.  Tonsils not swollen or erythematous.  Hearing normal.  Neck: Supple, thyroid normal.  Respiratory: Respiratory effort normal, BS equal bilaterally without rales, rhonchi, wheezing or stridor.  Cardio: RRR with no MRGs. Brisk peripheral pulses without edema.  Abdomen: Soft, + BS.  Non tender, no guarding, rebound, hernias, masses. Lymphatics: Non tender without lymphadenopathy.  Musculoskeletal: Full ROM, 5/5 strength, Normal gait. He has R thumb DIP amputation.  Skin: Warm, dry without rashes, lesions, ecchymosis.  Neuro: Cranial nerves intact. No cerebellar symptoms.  Psych: Awake and oriented X 3, normal affect, Insight and Judgment appropriate.    Revonda Humphrey, NP 4:14 PM [facility]

## 2021-01-26 ENCOUNTER — Encounter: Payer: Self-pay | Admitting: Nurse Practitioner

## 2021-01-26 ENCOUNTER — Other Ambulatory Visit: Payer: Self-pay | Admitting: Internal Medicine

## 2021-01-26 ENCOUNTER — Ambulatory Visit: Payer: 59 | Admitting: Nurse Practitioner

## 2021-01-26 ENCOUNTER — Other Ambulatory Visit: Payer: Self-pay

## 2021-01-26 VITALS — BP 108/62 | HR 80 | Temp 97.7°F | Wt 148.6 lb

## 2021-01-26 DIAGNOSIS — R0989 Other specified symptoms and signs involving the circulatory and respiratory systems: Secondary | ICD-10-CM | POA: Diagnosis not present

## 2021-01-26 DIAGNOSIS — E1122 Type 2 diabetes mellitus with diabetic chronic kidney disease: Secondary | ICD-10-CM | POA: Diagnosis not present

## 2021-01-26 DIAGNOSIS — L293 Anogenital pruritus, unspecified: Secondary | ICD-10-CM

## 2021-01-26 DIAGNOSIS — Z6826 Body mass index (BMI) 26.0-26.9, adult: Secondary | ICD-10-CM

## 2021-01-26 DIAGNOSIS — N181 Chronic kidney disease, stage 1: Secondary | ICD-10-CM

## 2021-01-26 DIAGNOSIS — E1169 Type 2 diabetes mellitus with other specified complication: Secondary | ICD-10-CM | POA: Diagnosis not present

## 2021-01-26 DIAGNOSIS — Z79899 Other long term (current) drug therapy: Secondary | ICD-10-CM

## 2021-01-26 DIAGNOSIS — B356 Tinea cruris: Secondary | ICD-10-CM

## 2021-01-26 DIAGNOSIS — E785 Hyperlipidemia, unspecified: Secondary | ICD-10-CM

## 2021-01-26 DIAGNOSIS — E559 Vitamin D deficiency, unspecified: Secondary | ICD-10-CM

## 2021-01-26 MED ORDER — FLUCONAZOLE 150 MG PO TABS: 150.0000 mg | ORAL_TABLET | ORAL | 0 refills | Status: DC

## 2021-01-27 LAB — COMPLETE METABOLIC PANEL WITH GFR
AG Ratio: 1.6 (calc) (ref 1.0–2.5)
ALT: 22 U/L (ref 9–46)
AST: 17 U/L (ref 10–35)
Albumin: 4.4 g/dL (ref 3.6–5.1)
Alkaline phosphatase (APISO): 87 U/L (ref 35–144)
BUN: 12 mg/dL (ref 7–25)
CO2: 27 mmol/L (ref 20–32)
Calcium: 9.6 mg/dL (ref 8.6–10.3)
Chloride: 104 mmol/L (ref 98–110)
Creat: 0.75 mg/dL (ref 0.70–1.30)
Globulin: 2.7 g/dL (calc) (ref 1.9–3.7)
Glucose, Bld: 150 mg/dL — ABNORMAL HIGH (ref 65–99)
Potassium: 4.2 mmol/L (ref 3.5–5.3)
Sodium: 140 mmol/L (ref 135–146)
Total Bilirubin: 0.4 mg/dL (ref 0.2–1.2)
Total Protein: 7.1 g/dL (ref 6.1–8.1)
eGFR: 107 mL/min/{1.73_m2} (ref >=60)

## 2021-01-27 LAB — HEMOGLOBIN A1C
Hgb A1c MFr Bld: 11.2 % of total Hgb — ABNORMAL HIGH (ref <5.7)
Mean Plasma Glucose: 275 mg/dL
eAG (mmol/L): 15.2 mmol/L

## 2021-01-27 LAB — C. TRACHOMATIS/N. GONORRHOEAE RNA
C. trachomatis RNA, TMA: NOT DETECTED
N. gonorrhoeae RNA, TMA: NOT DETECTED

## 2021-01-27 LAB — CBC WITH DIFFERENTIAL/PLATELET
Absolute Monocytes: 438 cells/uL (ref 200–950)
Basophils Absolute: 42 cells/uL (ref 0–200)
Basophils Relative: 0.7 %
Eosinophils Absolute: 48 cells/uL (ref 15–500)
Eosinophils Relative: 0.8 %
HCT: 48.8 % (ref 38.5–50.0)
Hemoglobin: 16.5 g/dL (ref 13.2–17.1)
Lymphs Abs: 1812 cells/uL (ref 850–3900)
MCH: 30.1 pg (ref 27.0–33.0)
MCHC: 33.8 g/dL (ref 32.0–36.0)
MCV: 88.9 fL (ref 80.0–100.0)
MPV: 12.4 fL (ref 7.5–12.5)
Monocytes Relative: 7.3 %
Neutro Abs: 3660 cells/uL (ref 1500–7800)
Neutrophils Relative %: 61 %
Platelets: 195 10*3/uL (ref 140–400)
RBC: 5.49 10*6/uL (ref 4.20–5.80)
RDW: 13 % (ref 11.0–15.0)
Total Lymphocyte: 30.2 %
WBC: 6 10*3/uL (ref 3.8–10.8)

## 2021-01-27 LAB — LIPID PANEL
Cholesterol: 183 mg/dL (ref <200)
HDL: 44 mg/dL (ref >=40)
LDL Cholesterol (Calc): 110 mg/dL (calc) — ABNORMAL HIGH
Non-HDL Cholesterol (Calc): 139 mg/dL (calc) — ABNORMAL HIGH (ref <130)
Total CHOL/HDL Ratio: 4.2 (calc) (ref <5.0)
Triglycerides: 172 mg/dL — ABNORMAL HIGH (ref <150)

## 2021-01-27 NOTE — Progress Notes (Signed)
Lvm for pt to call back with his lab results

## 2021-01-27 NOTE — Progress Notes (Signed)
Pt is aware of the results and recommendations, did not have any questions for the provider or nurse

## 2021-03-23 ENCOUNTER — Ambulatory Visit: Payer: 59 | Admitting: Internal Medicine

## 2021-05-11 ENCOUNTER — Encounter: Payer: Self-pay | Admitting: Internal Medicine

## 2021-05-11 ENCOUNTER — Ambulatory Visit (INDEPENDENT_AMBULATORY_CARE_PROVIDER_SITE_OTHER): Payer: 59 | Admitting: Internal Medicine

## 2021-05-11 VITALS — BP 110/68 | HR 76 | Temp 97.3°F | Ht 65.0 in | Wt 152.6 lb

## 2021-05-11 DIAGNOSIS — R0989 Other specified symptoms and signs involving the circulatory and respiratory systems: Secondary | ICD-10-CM | POA: Diagnosis not present

## 2021-05-11 DIAGNOSIS — E1169 Type 2 diabetes mellitus with other specified complication: Secondary | ICD-10-CM

## 2021-05-11 DIAGNOSIS — E559 Vitamin D deficiency, unspecified: Secondary | ICD-10-CM

## 2021-05-11 DIAGNOSIS — E1122 Type 2 diabetes mellitus with diabetic chronic kidney disease: Secondary | ICD-10-CM | POA: Diagnosis not present

## 2021-05-11 DIAGNOSIS — E785 Hyperlipidemia, unspecified: Secondary | ICD-10-CM

## 2021-05-11 DIAGNOSIS — N181 Chronic kidney disease, stage 1: Secondary | ICD-10-CM

## 2021-05-11 DIAGNOSIS — Z79899 Other long term (current) drug therapy: Secondary | ICD-10-CM

## 2021-05-11 NOTE — Progress Notes (Signed)
? ?Future Appointments  ?Date Time Provider Department  ?05/11/2021  2:30 PM Unk Pinto, MD [facility]  ?09/11/2021 11:00 AM Unk Pinto, MD [facility]  ? ? ?History of Present Illness: ? ? ?    This very nice 55 y.o. married [place] man who  presents for  6  month follow up with HTN, HLD,  T2_NIDDM   and Vitamin D Deficiency.  ? ? ?    Patient has hx/o labile HTN  followed expectantly & BP has been controlled at home. Today?s BP is a6t goal - 110/68. Patient has had no complaints of any cardiac type chest pain, palpitations, dyspnea / orthopnea / PND, dizziness, claudication, or dependent edema. ? ? ?    Hyperlipidemia is not controlled with diet & Rosuvastatin was added . Patient denies myalgias or other med SE?s. Last Lipids were not at goal : ? ?Lab Results  ?Component Value Date  ? CHOL 183 01/26/2021  ? HDL 44 01/26/2021  ? LDLCALC 110 (H) 01/26/2021  ? TRIG 172 (H) 01/26/2021  ? CHOLHDL 4.2 01/26/2021  ? ? ? ?Also, the patient has history of T2_NIDDM (A1c 6.7% /2013) and has had no symptoms of reactive hypoglycemia, diabetic polys, paresthesias or visual blurring.  Last A1c was poor control: ? ?Lab Results  ?Component Value Date  ? HGBA1C 11.2 (H) 01/26/2021  ? ?    ? ?Further, the patient also has history of Vitamin D Deficiency   ("24" /2013) and supplements vitamin D . Last vitamin D was still very low (goal  70-100) : ? ?Lab Results  ?Component Value Date  ? VD25OH 25 (L) 09/09/2020  ? ? ? ?Current Outpatient Medications on File Prior to Visit  ?Medication Sig  ? aspirin EC 81 MG tablet Take 81 mg by mouth daily.  ? fluconazole (DIFLUCAN) 150 MG tablet Take 1 tablet (150 mg total) by mouth once a week.  ? glipiZIDE (GLUCOTROL) 5 MG tablet TAKE 1 TABLET BY MOUTH TWICE DAILY WITH MEALS FOR DIABETES  ? metFORMIN (GLUCOPHAGE-XR) 500 MG 24 hr tablet Take  2 tablets  2 x /day  with Meals for Diabetes                                                  /                     TAKE 2 TABLETS BY MOUTH  ?  rosuvastatin (CRESTOR) 40 MG tablet Take  1 tablet  2 x /week  on Mon & Thurs  for Cholesterol  ? VITAMIN D PO Take 5,000 Units by mouth. Takes 2 to 3 times a week.  ? ? ? ?Allergies  ?Allergen Reactions  ? Poison Ivy Extract [Poison Ivy Extract] Rash  ? ? ? ?PMHx:   ?Past Medical History:  ?Diagnosis Date  ? Diabetes mellitus without complication ([place])   ? Hyperlipidemia   ? Vitamin D deficiency   ? ? ? ?Immunization History  ?Administered Date(s) Administered  ? Influenza Inj Mdck Quad With Preservative 11/05/2017, 11/06/2018  ? Influenza Split 12/29/2013  ? Influenza, Seasonal, Injecte, Preservative Fre 11/11/2015  ? Influenza-Unspecified 11/25/2020  ? PPD Test 12/29/2012, 12/29/2013, 01/26/2015, 02/01/2016, 03/29/2017, 04/25/2018, 07/06/2019, 09/09/2020  ? Pneumococcal Polysaccharide-23 12/11/2011, 03/29/2017  ? Tdap 12/11/2011  ? ? ? ?Past Surgical History:  ?Procedure Laterality Date  ?  CATARACT EXTRACTION Left 09/2016  ? LITHOTRIPSY  2002  ? ? ?FHx:    Reviewed / unchanged ? ?SHx:    Reviewed / unchanged  ? ?Systems Review: ? ?Constitutional: Denies fever, chills, wt changes, headaches, insomnia, fatigue, night sweats, change in appetite. ?Eyes: Denies redness, blurred vision, diplopia, discharge, itchy, watery eyes.  ?ENT: Denies discharge, congestion, post nasal drip, epistaxis, sore throat, earache, hearing loss, dental pain, tinnitus, vertigo, sinus pain, snoring.  ?CV: Denies chest pain, palpitations, irregular heartbeat, syncope, dyspnea, diaphoresis, orthopnea, PND, claudication or edema. ?Respiratory: denies cough, dyspnea, DOE, pleurisy, hoarseness, laryngitis, wheezing.  ?Gastrointestinal: Denies dysphagia, odynophagia, heartburn, reflux, water brash, abdominal pain or cramps, nausea, vomiting, bloating, diarrhea, constipation, hematemesis, melena, hematochezia  or hemorrhoids. ?Genitourinary: Denies dysuria, frequency, urgency, nocturia, hesitancy, discharge, hematuria or flank  pain. ?Musculoskeletal: Denies arthralgias, myalgias, stiffness, jt. swelling, pain, limping or strain/sprain.  ?Skin: Denies pruritus, rash, hives, warts, acne, eczema or change in skin lesion(s). ?Neuro: No weakness, tremor, incoordination, spasms, paresthesia or pain. ?Psychiatric: Denies confusion, memory loss or sensory loss. ?Endo: Denies change in weight, skin or hair change.  ?Heme/Lymph: No excessive bleeding, bruising or enlarged lymph nodes. ? ?Physical Exam ? ?BP 110/68   Pulse 76   Temp (!) 97.3 ?F (36.3 ?C)   Ht 5\' 5"  (1.651 m)   Wt 152 lb 9.6 oz (69.2 kg)   SpO2 99%   BMI 25.39 kg/m?  ? ?Appears  well nourished, well groomed  and in no distress. ? ?Eyes: PERRLA, EOMs, conjunctiva no swelling or erythema. ?Sinuses: No frontal/maxillary tenderness ?ENT/Mouth: EAC's clear, TM's nl w/o erythema, bulging. Nares clear w/o erythema, swelling, exudates. Oropharynx clear without erythema or exudates. Oral hygiene is good. Tongue normal, non obstructing. Hearing intact.  ?Neck: Supple. Thyroid not palpable. Car 2+/2+ without bruits, nodes or JVD. ?Chest: Respirations nl with BS clear & equal w/o rales, rhonchi, wheezing or stridor.  ?Cor: Heart sounds normal w/ regular rate and rhythm without sig. murmurs, gallops, clicks or rubs. Peripheral pulses normal and equal  without edema.  ?Abdomen: Soft & bowel sounds normal. Non-tender w/o guarding, rebound, hernias, masses or organomegaly.  ?Lymphatics: Unremarkable.  ?Musculoskeletal: Full ROM all peripheral extremities, joint stability, 5/5 strength and normal gait.  ?Skin: Warm, dry without exposed rashes, lesions or ecchymosis apparent.  ?Neuro: Cranial nerves intact, reflexes equal bilaterally. Sensory-motor testing grossly intact. Tendon reflexes grossly intact.  ?Pysch: Alert & oriented x 3.  Insight and judgement nl & appropriate. No ideations. ? ?Assessment and Plan: ? ?1. Labile hypertension ? ?- Continue medication, monitor blood pressure at home.   ?- Continue DASH diet.  Reminder to go to the ER if any CP,  ?SOB, nausea, dizziness, severe HA, changes vision/speech. ?  ?- CBC with Differential/Platelet ?- COMPLETE METABOLIC PANEL WITH GFR ?- Magnesium ?- TSH ? ?2. Hyperlipidemia associated with type 2 diabetes mellitus ([place]) ? ?- Continue diet/meds, exercise,& lifestyle modifications.  ?- Continue monitor periodic cholesterol/liver & renal functions  ?  ?- Lipid panel ?- TSH ? ?3. 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 ?- Hemoglobin A1c ?- Insulin, random ? ?4. Vitamin D deficiency ? ?- Continue diet, exercise  ?- Lifestyle modifications.  ?- Monitor appropriate labs ? ?- Continue supplementation ?   ?- VITAMIN D 25 Hydroxy  ? ?5. Medication management ? ?- CBC with Differential/Platelet ?- COMPLETE METABOLIC PANEL WITH GFR ?- Magnesium ?- Lipid panel ?- TSH ?- Hemoglobin  A1c ?- Insulin, random ?- VITAMIN D 25 Hydroxy  ? ?      Discussed  regular exercise, BP monitoring, weight control to achieve/maintain BMI less than 25 and discussed med and SE's. Recommended labs to assess /monitor clinical status .  I discussed the assessment and treatment plan with the patient. The patient was provided an opportunity to ask questions and all were answered. The patient agreed with the plan and demonstrated an understanding of the instructions.  I provided over 30 minutes of exam, counseling, chart review and  complex critical decision making. ? ?      The patient was advised to call back or seek an in-person evaluation if the symptoms worsen or if the condition fails to improve as anticipated. ? ? ?Kirtland Bouchard, MD ? ? ? ?

## 2021-05-11 NOTE — Patient Instructions (Signed)

## 2021-05-12 ENCOUNTER — Other Ambulatory Visit: Payer: Self-pay | Admitting: Internal Medicine

## 2021-05-12 DIAGNOSIS — E1169 Type 2 diabetes mellitus with other specified complication: Secondary | ICD-10-CM

## 2021-05-12 LAB — CBC WITH DIFFERENTIAL/PLATELET
Absolute Monocytes: 517 cells/uL (ref 200–950)
Basophils Absolute: 50 cells/uL (ref 0–200)
Basophils Relative: 0.9 %
Eosinophils Absolute: 99 cells/uL (ref 15–500)
Eosinophils Relative: 1.8 %
HCT: 46.6 % (ref 38.5–50.0)
Hemoglobin: 15.7 g/dL (ref 13.2–17.1)
Lymphs Abs: 1546 cells/uL (ref 850–3900)
MCH: 30.5 pg (ref 27.0–33.0)
MCHC: 33.7 g/dL (ref 32.0–36.0)
MCV: 90.7 fL (ref 80.0–100.0)
MPV: 12 fL (ref 7.5–12.5)
Monocytes Relative: 9.4 %
Neutro Abs: 3289 cells/uL (ref 1500–7800)
Neutrophils Relative %: 59.8 %
Platelets: 217 10*3/uL (ref 140–400)
RBC: 5.14 10*6/uL (ref 4.20–5.80)
RDW: 12.9 % (ref 11.0–15.0)
Total Lymphocyte: 28.1 %
WBC: 5.5 10*3/uL (ref 3.8–10.8)

## 2021-05-12 LAB — LIPID PANEL
Cholesterol: 204 mg/dL — ABNORMAL HIGH (ref <200)
HDL: 44 mg/dL (ref >=40)
LDL Cholesterol (Calc): 123 mg/dL (calc) — ABNORMAL HIGH
Non-HDL Cholesterol (Calc): 160 mg/dL (calc) — ABNORMAL HIGH (ref <130)
Total CHOL/HDL Ratio: 4.6 (calc) (ref <5.0)
Triglycerides: 232 mg/dL — ABNORMAL HIGH (ref <150)

## 2021-05-12 LAB — COMPLETE METABOLIC PANEL WITH GFR
AG Ratio: 1.6 (calc) (ref 1.0–2.5)
ALT: 19 U/L (ref 9–46)
AST: 14 U/L (ref 10–35)
Albumin: 4.2 g/dL (ref 3.6–5.1)
Alkaline phosphatase (APISO): 85 U/L (ref 35–144)
BUN: 14 mg/dL (ref 7–25)
CO2: 24 mmol/L (ref 20–32)
Calcium: 9.3 mg/dL (ref 8.6–10.3)
Chloride: 103 mmol/L (ref 98–110)
Creat: 0.78 mg/dL (ref 0.70–1.30)
Globulin: 2.6 g/dL (calc) (ref 1.9–3.7)
Glucose, Bld: 207 mg/dL — ABNORMAL HIGH (ref 65–99)
Potassium: 4 mmol/L (ref 3.5–5.3)
Sodium: 136 mmol/L (ref 135–146)
Total Bilirubin: 0.4 mg/dL (ref 0.2–1.2)
Total Protein: 6.8 g/dL (ref 6.1–8.1)
eGFR: 106 mL/min/{1.73_m2} (ref >=60)

## 2021-05-12 LAB — MAGNESIUM: Magnesium: 2 mg/dL (ref 1.5–2.5)

## 2021-05-12 LAB — HEMOGLOBIN A1C
Hgb A1c MFr Bld: 9.8 % of total Hgb — ABNORMAL HIGH (ref <5.7)
Mean Plasma Glucose: 235 mg/dL
eAG (mmol/L): 13 mmol/L

## 2021-05-12 LAB — INSULIN, RANDOM: Insulin: 23.6 u[IU]/mL — ABNORMAL HIGH

## 2021-05-12 LAB — TSH: TSH: 2.6 mIU/L (ref 0.40–4.50)

## 2021-05-12 LAB — VITAMIN D 25 HYDROXY (VIT D DEFICIENCY, FRACTURES): Vit D, 25-Hydroxy: 31 ng/mL (ref 30–100)

## 2021-05-12 MED ORDER — METFORMIN HCL ER 500 MG PO TB24: ORAL_TABLET | ORAL | 1 refills | Status: DC

## 2021-05-12 NOTE — Progress Notes (Signed)
<>0<>0<>0<>0<>0<>0<>0<>0<>0<>0<>0<>0<>0<>0<>0<>0<>0<>0<>0<>0<>0<>0<>0 ?<>0<>0<>0<>0<>0<>0<>0<>0<>0<>0<>0<>0<>0<>0<>0<>0<>0<>0<>0<>0<>0<>0<>0 ? ?- Glucose is 207 mg%  - ELEVATED  !  ( Ideal or goal is less than 120 mg% ) ?&  ?- A1c = 9.8% - Also Way too HIGH  !  (Ideal or Goal is less than 6.0% )  ? ?- Be sure taking Metformin 2 tablets  2 x /day = 4 tablets  /day ! ? ?And Recommend ? ?- Increase your Glipizide 5 mg 2 x /day up to 2 tablets (10 mg) 2 x /day  ? ?<><><><><><><><><><><><><><><><><><><><><><><><><><><><><><><><><> ? ?- Total Chol = ? 204? is very high risk for Heart Attack /Stroke /Vascular Dementia ??? ? ? ? ( ?Ideal or Goal is less than 180 ?! ?)  ?& ?- Bad /Dangerous LDL Chol =   123  - >> Sitting on a time Bomb ! ??? ? ? ? ?( ?Ideal or Goal is less than 70 ?! ?)  ?? ?- Treating with meds to lower Cholesterol is treating the result  ??? ? ?? ? ? ? ? ? ? ? ? ? ? ? ? ? ? ? ? ? ? ? ? ? ? ? ? ? ? ? ? ? ? ? ? ? ? ? ?  & NOT ?treating the cause ?- The cause is Bad Diet ! ?? ?- Read or listen to ? ? ?Dr Glenard Haring 's book ? ? ? " How Not to Die ! "  ?? ?- Recommend ?a stricter plant based low cholesterol diet  ?- Cholesterol only comes from animal sources  ?- ie. meat, dairy, egg yolks ?- Eat all the vegetables you want. ?- Avoid meat, especially red meat - Beef AND Pork . ?- Avoid cheese & dairy - milk & ice cream. ?  ?- Cheese is the most concentrated form of trans-fats which  ?is the worst thing to clog up our arteries.  ?- Veggie cheese is OK which can be found in the fresh  ?                                                                                  produce section at [employer] ? ?- Recommend Increase your Rosuvastatin ( Crestor ) 40 mg 2 x /week to take  ?                                                                                                        1/2 tablet every day ? ?<><><><><><><><><><><><><><><><><><><><><><><><><><><><><><><><><> ? ?Triglycerides (  232    ) or fats in  blood are too high  ?(goal is less than 150)   ? ?- Recommend avoid fried & greasy foods,  sweets / candy,  ? ?- Avoid white rice  ?(brown or wild rice or Quinoa is OK),  ? ?-  Avoid white potatoes  ?(sweet potatoes are OK)  ? ?- Avoid anything made from white flour  ?- bagels, doughnuts, rolls, buns, biscuits, white and  ? ?wheat breads, pizza crust and traditional  ?pasta made of white flour & egg white ? ?- (vegetarian pasta or spinach or wheat pasta is OK).   ? ?- Multi-grain bread is OK - like multi-grain flat bread or ? sandwich thins.  ? ?- Avoid alcohol in excess.  ? ?- Exercise is also important. ? ?<><><><><><><><><><><><><><><><><><><><><><><><><><><><><><><><><> ?<><><><><><><><><><><><><><><><><><><><><><><><><><><><><><><><><> ? ?-  Vitamin D is ALWAYS  Dangerously LOW  ! ? ?- Vitamin D goal is between 70-100.  ? ?- Please take Vitamin D 5,000 unit capsule Daily ? ?- It is very important as a natural anti-inflammatory and helping the  ?immune system protect against viral infections, like the Covid-19  ? ? ?helping hair, skin, and nails, as well as reducing stroke and  ?heart attack risk.  ? ?- It helps your bones and helps with mood. ? ?- It also decreases numerous cancer risks so please  ?take it as directed.  ? ?- Low Vit D is associated with a 200-300% higher risk for  ?CANCER  ? ?and 200-300% higher risk for HEART   ATTACK  &  STROKE.   ? ?- It is also associated with higher death rate at younger ages,  ? ?autoimmune diseases like Rheumatoid arthritis, Lupus,  ?Multiple Sclerosis.    ? ?- Also many other serious conditions, like depression, Alzheimer's ? ?Dementia, infertility, muscle aches, fatigue, fibromyalgia  ? ?- just to name a  few ? ?<>0<>0<>0<>0<>0<>0<>0<>0<>0<>0<>0<>0<>0<>0<>0<>0<>0<>0<>0<>0<>0<>0<>0 ?<>0<>0<>0<>0<>0<>0<>0<>0<>0<>0<>0<>0<>0<>0<>0<>0<>0<>0<>0<>0<>0<>0<>0 ? ? ? ? ?<><><><><><><><><><><><><><><><><><><><><><><><><><><><><><><><><> ?<><><><><><><><><><><><><><><><><><><><><><><><><><><><><><><><><> ? ?-   ? ? ? ? ? ? ?

## 2021-05-13 ENCOUNTER — Encounter: Payer: Self-pay | Admitting: Internal Medicine

## 2021-05-15 NOTE — Progress Notes (Signed)
Pt is aware of lab results and medication changes/recommendations, does not have any questions for provider or nurse.

## 2021-09-04 ENCOUNTER — Other Ambulatory Visit: Payer: Self-pay | Admitting: Internal Medicine

## 2021-09-04 DIAGNOSIS — E119 Type 2 diabetes mellitus without complications: Secondary | ICD-10-CM

## 2021-09-04 MED ORDER — GLIPIZIDE 5 MG PO TABS: ORAL_TABLET | ORAL | 3 refills | Status: DC

## 2021-09-11 ENCOUNTER — Encounter: Payer: 59 | Admitting: Internal Medicine

## 2021-09-12 ENCOUNTER — Encounter: Payer: 59 | Admitting: Internal Medicine

## 2021-09-27 ENCOUNTER — Encounter: Payer: 59 | Admitting: Internal Medicine

## 2021-10-06 ENCOUNTER — Other Ambulatory Visit: Payer: Self-pay

## 2021-10-06 DIAGNOSIS — E1169 Type 2 diabetes mellitus with other specified complication: Secondary | ICD-10-CM

## 2021-10-06 MED ORDER — METFORMIN HCL ER 500 MG PO TB24: ORAL_TABLET | ORAL | 1 refills | Status: DC

## 2021-12-27 ENCOUNTER — Encounter: Payer: Self-pay | Admitting: Internal Medicine

## 2021-12-27 ENCOUNTER — Ambulatory Visit (INDEPENDENT_AMBULATORY_CARE_PROVIDER_SITE_OTHER): Payer: 59 | Admitting: Internal Medicine

## 2021-12-27 VITALS — BP 128/70 | HR 90 | Temp 97.8°F | Resp 16 | Ht 65.0 in | Wt 151.6 lb

## 2021-12-27 DIAGNOSIS — Z79899 Other long term (current) drug therapy: Secondary | ICD-10-CM

## 2021-12-27 DIAGNOSIS — Z125 Encounter for screening for malignant neoplasm of prostate: Secondary | ICD-10-CM

## 2021-12-27 DIAGNOSIS — R0989 Other specified symptoms and signs involving the circulatory and respiratory systems: Secondary | ICD-10-CM | POA: Diagnosis not present

## 2021-12-27 DIAGNOSIS — Z0000 Encounter for general adult medical examination without abnormal findings: Secondary | ICD-10-CM | POA: Diagnosis not present

## 2021-12-27 DIAGNOSIS — Z136 Encounter for screening for cardiovascular disorders: Secondary | ICD-10-CM | POA: Diagnosis not present

## 2021-12-27 DIAGNOSIS — E1122 Type 2 diabetes mellitus with diabetic chronic kidney disease: Secondary | ICD-10-CM

## 2021-12-27 DIAGNOSIS — I700 Atherosclerosis of aorta: Secondary | ICD-10-CM | POA: Diagnosis not present

## 2021-12-27 DIAGNOSIS — Z111 Encounter for screening for respiratory tuberculosis: Secondary | ICD-10-CM | POA: Diagnosis not present

## 2021-12-27 DIAGNOSIS — E1169 Type 2 diabetes mellitus with other specified complication: Secondary | ICD-10-CM

## 2021-12-27 DIAGNOSIS — J40 Bronchitis, not specified as acute or chronic: Secondary | ICD-10-CM

## 2021-12-27 DIAGNOSIS — E559 Vitamin D deficiency, unspecified: Secondary | ICD-10-CM

## 2021-12-27 DIAGNOSIS — R5383 Other fatigue: Secondary | ICD-10-CM

## 2021-12-27 DIAGNOSIS — Z23 Encounter for immunization: Secondary | ICD-10-CM

## 2021-12-27 DIAGNOSIS — Z0001 Encounter for general adult medical examination with abnormal findings: Secondary | ICD-10-CM

## 2021-12-27 DIAGNOSIS — Z1211 Encounter for screening for malignant neoplasm of colon: Secondary | ICD-10-CM

## 2021-12-27 MED ORDER — DEXAMETHASONE 2 MG PO TABS: ORAL_TABLET | ORAL | 0 refills | Status: DC

## 2021-12-27 MED ORDER — AZITHROMYCIN 250 MG PO TABS: ORAL_TABLET | ORAL | 1 refills | Status: DC

## 2021-12-27 MED ORDER — BENZONATATE 200 MG PO CAPS: ORAL_CAPSULE | ORAL | 1 refills | Status: DC

## 2021-12-27 NOTE — Patient Instructions (Signed)

## 2021-12-27 NOTE — Progress Notes (Signed)
Annual  Screening/Preventative Visit  & Comprehensive Evaluation & Examination   Future Appointments  Date Time Provider Department Center  09/12/2021 11:00 AM [name], MD [facility] None             This very nice 56 y.o. married [name] presents for a Screening /Preventative Visit & comprehensive evaluation and management of multiple medical co-morbidities.  Patient has been followed for HTN, HLD, T2_NIDDM  and Vitamin D Deficiency. Pasttient also reports a 1 week prodrome of head congestion , dry non productive "barking" cough & hoarseness.        Patient has been followed expectantly with labile HTN. Patient's BP has been controlled at home.  Today's BP is at goal - 128/70 .   Patient denies any cardiac symptoms as chest pain, palpitations, shortness of breath, dizziness or ankle swelling.       Patient's hyperlipidemia is not controlled with diet and Rosuvastatin was added at last OV.   Patient denies myalgias or other medication SE's. Last lipids were not at goal :   Lab Results  Component Value Date   CHOL 204 (H) 05/11/2021   HDL 44 05/11/2021   LDLCALC 123 (H) 05/11/2021   TRIG 232 (H) 05/11/2021   CHOLHDL 4.6 05/11/2021         Patient has hx/o T2_NIDDM (A1c 6.7% /2013) and  is taking Metformin & Glipizide.   Patient denies reactive hypoglycemic symptoms, visual blurring, diabetic polys or paresthesias.   Patient admits poor dietary compliance and last A1c was not at goal:   Lab Results  Component Value Date   HGBA1C 9.6 (H) 02/04/2020          Finally, patient has history of Vitamin D Deficiency ("24" /2013 & "24" /2016) and patient has not taken recommended vitamin D and last vitamin D was still not at goal:   Lab Results  Component Value Date   VD25OH 20 (L) 02/04/2020     Current Outpatient Medications on File Prior to Visit  Medication Sig   aspirin EC 81 MG tablet Take 81 mg by mouth daily.   glipiZIDE 5 MG tablet TAKE 1 tablet    2 x  /day with Meals for Diabetes   metFORMIN -XR 500 MG  Take 2 tablets 2 x /Day with Meals for Diabetes   rosuvastatin 40 MG tablet Take 1 tablet 2 x /week  on Tues & Thurs for Cholesterol   VITAMIN D PO Take 5,000 Units by mouth. Takes 2 to 3 times a week.    Allergies  Allergen Reactions   Poison Ivy Extract [Poison Ivy Extract] Rash     Past Medical History:  Diagnosis Date   Diabetes mellitus without complication (HCC)    Hyperlipidemia    Vitamin D deficiency      Health Maintenance  Topic Date Due   COVID-19 Vaccine (1) Never done   Zoster Vaccines- Shingrix (1 of 2) Never done   Pneumococcal Vaccine 53-38 Years old (3 - PCV) 03/30/2018   Fecal DNA (Cologuard)  04/18/2020   FOOT EXAM  07/04/2020   URINE MICROALBUMIN  07/05/2020   HEMOGLOBIN A1C  08/03/2020   INFLUENZA VACCINE  08/22/2020   OPHTHALMOLOGY EXAM  11/02/2020   TETANUS/TDAP  12/10/2021   PNEUMOCOCCAL POLYSACCHARIDE VACCINE AGE 18-64 HIGH RISK  Completed   Hepatitis C Screening  Completed   HIV Screening  Completed   HPV VACCINES  Aged Out     Immunization History  Administered  Date(s) Administered   Influenza Inj Mdck Quad  11/05/2017, 11/06/2018   Influenza 12/29/2013   Influenza, Seasonal 11/11/2015   PPD Test 03/29/2017, 04/25/2018, 07/06/2019   Pneumococcal - 23 12/11/2011, 03/29/2017   Tdap 12/11/2011    04/19/2017 - Cologard -Negative -Recc 3 year f/u due Apr 2022 ( ordering today)   Past Surgical History:  Procedure Laterality Date   CATARACT EXTRACTION Left 09/2016   LITHOTRIPSY  2002     Family History  Problem Relation Age of Onset   Cancer Mother    Diabetes Father     Social History   Socioeconomic History   Marital status: Married     Spouse still residing in [place]  Occupational History   Works in Journalist, newspaper services  Tobacco Use   Smoking status: Never   Smokeless tobacco: Never  Substance and Sexual Activity   Alcohol use: Yes    Comment: rarely drinks    Drug use: No   Sexual activity: Not on file      ROS Constitutional: Denies fever, chills, weight loss/gain, headaches, insomnia,  night sweats or change in appetite. Does c/o fatigue. Eyes: Denies redness, blurred vision, diplopia, discharge, itchy or watery eyes.  ENT: Denies discharge, congestion, post nasal drip, epistaxis, sore throat, earache, hearing loss, dental pain, Tinnitus, Vertigo, Sinus pain or snoring.  Cardio: Denies chest pain, palpitations, irregular heartbeat, syncope, dyspnea, diaphoresis, orthopnea, PND, claudication or edema Respiratory: denies cough, dyspnea, DOE, pleurisy, hoarseness, laryngitis or wheezing.  Gastrointestinal: Denies dysphagia, heartburn, reflux, water brash, pain, cramps, nausea, vomiting, bloating, diarrhea, constipation, hematemesis, melena, hematochezia, jaundice or hemorrhoids Genitourinary: Denies dysuria, frequency, urgency, nocturia, hesitancy, discharge, hematuria or flank pain Musculoskeletal: Denies arthralgia, myalgia, stiffness, Jt. Swelling, pain, limp or strain/sprain. Denies Falls. Skin: Denies puritis, rash, hives, warts, acne, eczema or change in skin lesion Neuro: No weakness, tremor, incoordination, spasms, paresthesia or pain Psychiatric: Denies confusion, memory loss or sensory loss. Denies Depression. Endocrine: Denies change in weight, skin, hair change, nocturia, and paresthesia, diabetic polys, visual blurring or hyper / hypo glycemic episodes.  Heme/Lymph: No excessive bleeding, bruising or enlarged lymph nodes.   Physical Exam  BP 128/70   Pulse 90   Temp 97.8 F (36.6 C)   Resp 16   Ht 5\' 5"  (1.651 m)   Wt 151 lb 9.6 oz (68.8 kg)   SpO2 97%   BMI 25.23 kg/m   General Appearance: Well nourished and well groomed and in no apparent distress. Dry cough. Sl hoarse.   Eyes: PERRLA, EOMs, conjunctiva no swelling or erythema, normal fundi and vessels. Sinuses: No frontal/maxillary tenderness ENT/Mouth: EACs patent /  TMs  nl. Nares clear without erythema, swelling, mucoid exudates. Oral hygiene is good. No erythema, swelling, or exudate. Tongue normal, non-obstructing. Tonsils not swollen or erythematous. Hearing normal.  Neck: Supple, thyroid not palpable. No bruits, nodes or JVD. Respiratory:  BS with few scattered bilateral rales and No  rhonci, wheezing or stridor. Cardio: Heart sounds are normal with regular rate and rhythm and no murmurs, rubs or gallops. Peripheral pulses are normal and equal bilaterally without edema. No aortic or femoral bruits. Chest: symmetric with normal excursions and percussion.  Abdomen: Soft, with Nl bowel sounds. Nontender, no guarding, rebound, hernias, masses, or organomegaly.  Lymphatics: Non tender without lymphadenopathy.  Musculoskeletal: Full ROM all peripheral extremities, joint stability, 5/5 strength, and normal gait. Skin: Warm and dry without rashes, lesions, cyanosis, clubbing or  ecchymosis.  Neuro: Cranial nerves intact, reflexes equal bilaterally. Normal  muscle tone, no cerebellar symptoms. Sensation intact.  Pysch: Alert and oriented X 3 with normal affect, insight and judgment appropriate.   Assessment and Plan  1. Annual Preventative/Screening Exam    2. Labile hypertension  - EKG 12-Lead - [place], RETROPERITNL ABD,  LTD - Urinalysis, Routine w reflex microscopic - Microalbumin / creatinine urine ratio - CBC with Differential/Platelet - COMPLETE METABOLIC PANEL WITH GFR - Magnesium - TSH  3. Hyperlipidemia associated with type 2 diabetes mellitus ([place])  - EKG 12-Lead - [place], RETROPERITNL ABD,  LTD - Lipid panel - TSH  4. Type 2 diabetes mellitus with stage 1 chronic kidney  disease, without long-term current use of insulin (HCC)  - EKG 12-Lead - [place], RETROPERITNL ABD,  LTD - HM DIABETES FOOT EXAM - LOW EXTREMITY NEUR EXAM DOCUM - Hemoglobin A1c - Insulin, random  5. Vitamin D deficiency  - VITAMIN D 25 Hydroxy   6. Prostate cancer  screening  - PSA  7. Screening-pulmonary TB  - TB Skin Test  8. Screening for colorectal cancer  - Cologuard  9. Screening for ischemic heart disease  - EKG 12-Lead  10. Screening for AAA (aortic abdominal aneurysm)  - [place], RETROPERITNL ABD,  LTD  11. Fatigue  - Iron, Total/Total Iron Binding Cap - Vitamin B12 - Testosterone - CBC with Differential/Platelet - TSH  12. Medication management  - Urinalysis, Routine w reflex microscopic - Microalbumin / creatinine urine ratio - CBC with Differential/Platelet - COMPLETE METABOLIC PANEL WITH GFR - Magnesium - Lipid panel - TSH - Hemoglobin A1c - Insulin, random - VITAMIN D 25 Hydroxy          Patient was counseled in prudent diet, weight control to achieve/maintain BMI less than 25, BP monitoring, regular exercise and medications as discussed.   Rx- Zpak , Decadron taper & Tessalon perles for his tracheobronchitis. Discussed med effects and SE's. Routine screening labs and tests as requested with regular follow-up as recommended. Over 40 minutes of exam, counseling, chart review and high complex critical decision making was performed   Kirtland Bouchard, MD

## 2021-12-28 LAB — CBC WITH DIFFERENTIAL/PLATELET
Absolute Monocytes: 706 cells/uL (ref 200–950)
Basophils Absolute: 59 cells/uL (ref 0–200)
Basophils Relative: 0.6 %
Eosinophils Absolute: 98 cells/uL (ref 15–500)
Eosinophils Relative: 1 %
HCT: 46.6 % (ref 38.5–50.0)
Hemoglobin: 15.9 g/dL (ref 13.2–17.1)
Lymphs Abs: 1578 cells/uL (ref 850–3900)
MCH: 30.4 pg (ref 27.0–33.0)
MCHC: 34.1 g/dL (ref 32.0–36.0)
MCV: 89.1 fL (ref 80.0–100.0)
MPV: 14.4 fL — ABNORMAL HIGH (ref 7.5–12.5)
Monocytes Relative: 7.2 %
Neutro Abs: 7360 cells/uL (ref 1500–7800)
Neutrophils Relative %: 75.1 %
Platelets: 181 10*3/uL (ref 140–400)
RBC: 5.23 10*6/uL (ref 4.20–5.80)
RDW: 12.3 % (ref 11.0–15.0)
Total Lymphocyte: 16.1 %
WBC: 9.8 10*3/uL (ref 3.8–10.8)

## 2021-12-28 LAB — URINALYSIS, ROUTINE W REFLEX MICROSCOPIC
Bilirubin Urine: NEGATIVE
Hgb urine dipstick: NEGATIVE
Ketones, ur: NEGATIVE
Leukocytes,Ua: NEGATIVE
Nitrite: NEGATIVE
Protein, ur: NEGATIVE
Specific Gravity, Urine: 1.009 (ref 1.001–1.035)
pH: 6 (ref 5.0–8.0)

## 2021-12-28 LAB — COMPLETE METABOLIC PANEL WITH GFR
AG Ratio: 1.7 (calc) (ref 1.0–2.5)
ALT: 21 U/L (ref 9–46)
AST: 18 U/L (ref 10–35)
Albumin: 4.4 g/dL (ref 3.6–5.1)
Alkaline phosphatase (APISO): 91 U/L (ref 35–144)
BUN: 13 mg/dL (ref 7–25)
CO2: 25 mmol/L (ref 20–32)
Calcium: 9.7 mg/dL (ref 8.6–10.3)
Chloride: 102 mmol/L (ref 98–110)
Creat: 0.73 mg/dL (ref 0.70–1.30)
Globulin: 2.6 g/dL (calc) (ref 1.9–3.7)
Glucose, Bld: 207 mg/dL — ABNORMAL HIGH (ref 65–99)
Potassium: 3.9 mmol/L (ref 3.5–5.3)
Sodium: 139 mmol/L (ref 135–146)
Total Bilirubin: 0.4 mg/dL (ref 0.2–1.2)
Total Protein: 7 g/dL (ref 6.1–8.1)
eGFR: 108 mL/min/{1.73_m2} (ref >=60)

## 2021-12-28 LAB — HEMOGLOBIN A1C
Hgb A1c MFr Bld: 10.2 % of total Hgb — ABNORMAL HIGH (ref <5.7)
Mean Plasma Glucose: 246 mg/dL
eAG (mmol/L): 13.6 mmol/L

## 2021-12-28 LAB — LIPID PANEL
Cholesterol: 197 mg/dL (ref <200)
HDL: 43 mg/dL (ref >=40)
Non-HDL Cholesterol (Calc): 154 mg/dL (calc) — ABNORMAL HIGH (ref <130)
Total CHOL/HDL Ratio: 4.6 (calc) (ref <5.0)
Triglycerides: 655 mg/dL — ABNORMAL HIGH (ref <150)

## 2021-12-28 LAB — VITAMIN D 25 HYDROXY (VIT D DEFICIENCY, FRACTURES): Vit D, 25-Hydroxy: 23 ng/mL — ABNORMAL LOW (ref 30–100)

## 2021-12-28 LAB — VITAMIN B12: Vitamin B-12: 668 pg/mL (ref 200–1100)

## 2021-12-28 LAB — MICROALBUMIN / CREATININE URINE RATIO
Creatinine, Urine: 27 mg/dL (ref 20–320)
Microalb Creat Ratio: 22 mcg/mg creat (ref <30)
Microalb, Ur: 0.6 mg/dL

## 2021-12-28 LAB — IRON, TOTAL/TOTAL IRON BINDING CAP
%SAT: 7 % (calc) — ABNORMAL LOW (ref 20–48)
Iron: 22 ug/dL — ABNORMAL LOW (ref 50–180)
TIBC: 295 mcg/dL (calc) (ref 250–425)

## 2021-12-28 LAB — PSA: PSA: 0.7 ng/mL (ref <=4.00)

## 2021-12-28 LAB — INSULIN, RANDOM: Insulin: 18.8 u[IU]/mL — ABNORMAL HIGH

## 2021-12-28 LAB — TSH: TSH: 6.51 mIU/L — ABNORMAL HIGH (ref 0.40–4.50)

## 2021-12-28 LAB — MAGNESIUM: Magnesium: 2.1 mg/dL (ref 1.5–2.5)

## 2021-12-31 ENCOUNTER — Other Ambulatory Visit: Payer: Self-pay | Admitting: Internal Medicine

## 2021-12-31 DIAGNOSIS — E039 Hypothyroidism, unspecified: Secondary | ICD-10-CM

## 2021-12-31 NOTE — Progress Notes (Signed)
<><><><><><><><><><><><><><><><><><><><><><><><><><><><><><><><><> <><><><><><><><><><><><><><><><><><><><><><><><><><><><><><><><><>  - Iron level is low  - Recommend take an OTC Iron supplement form the Drug store  &    - Eat more Veggies with Iron as   Carrots, Beets , all leafy green veggies as Spinach, Collards,  Turnips   - Mustard or Mixed Greens, Kale, Asparagus, Broccoli, Brussel Sprouts,   - Green Beans / peas, Soybeans, Lentils, Sweet Potatoes <><><><><><><><><><><><><><><><><><><><><><><><><><><><><><><><><>  - Glucose = 207 mg% - Still too high --->>> Goal is less than 120 mg %   So   - Avoid Sweets, Candy & White Stuff   - White Rice, White [place] II, White Flour  - Breads &  Pasta <><><><><><><><><><><><><><><><><><><><><><><><><><><><><><><><><> <><><><><><><><><><><><><><><><><><><><><><><><><><><><><><><><><>  -  Total Chol = 197 - elevated  - Goal is less than 180   But much worse is Triglycerides (   655  ) or fats in blood are  way way too high                 (   Ideal or  Goal is less than 150  !  )    - Recommend avoid fried & greasy foods,  sweets / candy,   - Avoid white rice  (brown or wild rice or Quinoa is OK),   - Avoid white potatoes  (sweet potatoes are OK)   - Avoid anything made from white flour  - bagels, doughnuts, rolls, buns, biscuits, white and   wheat breads, pizza crust and traditional  pasta made of white flour & egg white  - (vegetarian pasta or spinach or wheat pasta is OK).    - Multi-grain bread is OK - like multi-grain flat bread or  sandwich thins.   - Avoid alcohol in excess.   - Exercise is also important. <><><><><><><><><><><><><><><><><><><><><><><><><><><><><><><><><>  -  Thyroid appears too low in Blood, So recommend schedule a                                                                                         NV in 1 month to Recheck   <><><><><><><><><><><><><><><><><><><><><><><><><><><><><><><><><>  -  A1c = 10.2%  - Is Dangerously & Way too high   - Very important to get on a better diet as above,                                                                       so don't end up in coma in the hospital.   - Extremely important that you're taking Metformin  4 tablets  /day  & also   Recommend that you increase your Glipizide 5 mg to 3 x /day with each meal !   Please check your blood sugar EVERY day  &   if your glucoses  / sugars are still running over 200 mg% ,  then schedule another OV to discuss medication changes  <><><><><><><><><><><><><><><><><><><><><><><><><><><><><><><><><> <><><><><><><><><><><><><><><><><><><><><><><><><><><><><><><><><>  -  Vit D = 23 - Extremely & Dangerously Low  &   tells me that you are not taking any Vit D as has been recommended to you !   - You have been advised to take Vit D 5,000 Units   EVERY    day !  <><><><><><><><><><><><><><><><><><><><><><><><><><><><><><><><><> <><><><><><><><><><><><><><><><><><><><><><><><><><><><><><><><><>  -  PSA is OK - So no Prostate Cancer - Great ! <><><><><><><><><><><><><><><><><><><><><><><><><><><><><><><><><>  -  Vita,min B12 level is Normal & OK  <><><><><><><><><><><><><><><><><><><><><><><><><><><><><><><><><>  -  All Else - CBC - Kidneys - Electrolytes - Liver - Magnesium & Thyroid    - all  Normal / OK <><><><><><><><><><><><><><><><><><><><><><><><><><><><><><><><><> <><><><><><><><><><><><><><><><><><><><><><><><><><><><><><><><><>

## 2022-01-03 ENCOUNTER — Encounter: Payer: Self-pay | Admitting: Internal Medicine

## 2022-01-03 ENCOUNTER — Ambulatory Visit (INDEPENDENT_AMBULATORY_CARE_PROVIDER_SITE_OTHER): Payer: 59 | Admitting: Internal Medicine

## 2022-01-03 VITALS — BP 106/70 | HR 72 | Temp 97.7°F | Ht 65.0 in | Wt 150.8 lb

## 2022-01-03 DIAGNOSIS — B079 Viral wart, unspecified: Secondary | ICD-10-CM

## 2022-01-03 DIAGNOSIS — R0989 Other specified symptoms and signs involving the circulatory and respiratory systems: Secondary | ICD-10-CM

## 2022-01-03 NOTE — Progress Notes (Signed)
Future Appointments  Date Time Provider Department  01/03/2022 11:30 AM [name], MD [facility]  04/02/2022  2:30 PM Raynelle Dick, NP [facility]  01/03/2023  3:00 PM [name], MD [facility]    History of Present Illness:        Patient is a very nice 56 yo Hispanic man with labile HTN who presents with concerns re: warts of his dorsal Lt hand & over the DIP of the Rt index finger.    Medications  Current Outpatient Medications (Endocrine & Metabolic):    glipiZIDE (GLUCOTROL) 5 MG tablet, Take  1 tablet 2 x /day with Meals for Diabetes                                 /                         TAKE                           BY                              MOUTH   metFORMIN (GLUCOPHAGE-XR) 500 MG 24 hr tablet, Take  2 tablets  2 x /day  with Meals  for Diabetes   dexamethasone (DECADRON) 2 MG tablet, Take 1 tab 3 x day for 3 days,  then 2 x day for 3 days, then 1 tab daily  Current Outpatient Medications (Cardiovascular):    rosuvastatin (CRESTOR) 40 MG tablet, Take  1 tablet  2 x /week  on Mon & Thurs  for Cholesterol  Current Outpatient Medications (Respiratory):    benzonatate (TESSALON) 200 MG capsule, Take 1 perle 3 x / day to prevent cough  Current Outpatient Medications (Analgesics):    aspirin EC 81 MG tablet, Take 81 mg by mouth daily.   Current Outpatient Medications (Other):    Brimonidine Tartrate (LUMIFY) 0.025 % SOLN, Apply to eye 2 (two) times daily.   VITAMIN D PO, Take 5,000 Units by mouth. Takes 2 to 3 times a week.   azithromycin (ZITHROMAX) 250 MG tablet, Take 2 tablets with Food on  Day 1, then 1 tablet Daily with Food for Sinusitis / Bronchitis  Problem list He has Hyperlipidemia associated with type 2 diabetes mellitus (HCC); T2_NIDDM; Vitamin D deficiency; Labile hypertension; History of nephrolithiasis; BMI 26.0-26.9,adult; and Unspecified pterygium of right eye on their problem list.   Observations/Objective:  BP 106/70    Pulse 72   Temp 97.7 F (36.5 C)   Ht 5\' 5"  (1.651 m)   Wt 150 lb 12.8 oz (68.4 kg)   SpO2 98%   BMI 25.09 kg/m    There are 2 flat verrucae over the dorsal Lt hand betw the PIP of the Lt index & Long fingers.  There is also 1 flat verruca over the DIP of the Rt index finger.   After informed consent , the above areas were treated with liquid Nitrogen by triple freeze /thaw technique. Patient was  instructed in post treatment care.   Assessment and Plan:      Follow Up Instructions:        I discussed the assessment and treatment plan with the patient. The patient was provided an opportunity to ask questions and all were answered.  The patient agreed with the plan and demonstrated an understanding of the instructions.       The patient was advised to call back or seek an in-person evaluation if the symptoms worsen or if the condition fails to improve as anticipated.    Victor Maw, MD

## 2022-01-06 ENCOUNTER — Encounter: Payer: Self-pay | Admitting: Internal Medicine

## 2022-01-06 NOTE — Patient Instructions (Signed)
Warts  Warts are small growths on the skin. They are common and can occur on many areas of the body. A person may have one wart or several warts. In many cases, warts do not need treatment. They usually go away on their own over a period of many months to a few years. If needed, warts that cause problems or do not go away on their own can be treated. What are the causes? Warts are caused by a type of virus called human papillomavirus (HPV). HPV can spread from person to person through direct contact. Warts can also spread to other areas of the body when a person scratches a wart and then scratches another area of his or her body. What increases the risk? You are more likely to develop this condition if: You are 10-20 years old. You have a weakened body defense system (immune system). You are Caucasian. What are the signs or symptoms? The main symptom of this condition is small growths on the skin. Warts may: Be round or oval or have an irregular shape. Have a rough surface. Range in color from skin color to light yellow, brown, or gray. Generally be less than  inch (1.3 cm) in size. Go away and then come back again. Most warts are painless, but some can be painful if they are large or occur in an area of the body where pressure is applied to them, such as the bottom of the foot. How is this diagnosed? A wart can usually be diagnosed based on its appearance. In some cases, a tissue sample may be removed (biopsy) to be looked at under a microscope. How is this treated? In many cases, warts do not need treatment. Sometimes treatment is wanted. If treatment is needed or wanted, options may include: Applying medicated solutions, creams, or patches to the wart. These may be over-the-counter or prescription medicines that make the skin soft so that layers will gradually shed away. In many cases, the medicine is applied one or two times per day and covered with a bandage. Putting duct tape over  the top of the wart (occlusion). You will leave the tape in place for as long as told by your health care provider and then replace it with a new strip of tape. This is done until the wart goes away. Freezing the wart with liquid nitrogen (cryotherapy). Burning the wart with: Laser treatment. An electrified probe (electrocautery). Injection of a medicine into the wart to help the body's immune system fight off the wart. Surgery to remove the wart. Follow these instructions at home: Medicines Use over-the-counter and prescription medicines only as told by your health care provider. Do not use over-the-counter wart medicines on your face or genitals unless your health care provider tells you to do that. Lifestyle Keep your immune system healthy. To do this: Eat a healthy, balanced diet. Get enough sleep. Do not use any products that contain nicotine or tobacco. These products include cigarettes, chewing tobacco, and vaping devices, such as e-cigarettes. If you need help quitting, ask your health care provider. General instructions  Wash your hands after you touch a wart. Do not scratch or pick at a wart. Avoid shaving hair that is over a wart. Keep all follow-up visits. This is important. Contact a health care provider if: Your warts do not improve after treatment. You have redness, swelling, or pain at the site of a wart. You have bleeding from a wart that does not stop with light pressure. You have   diabetes and you develop a wart. Summary Warts are small growths on the skin. They are common and can occur on many areas of the body. In many cases, warts do not need treatment. Sometimes treatment is wanted. If treatment is needed or wanted, there are several treatment options. Apply over-the-counter and prescription medicines only as told by your health care provider. Wash your hands after you touch a wart. Keep all follow-up visits. This is important. This information is not intended  to replace advice given to you by your health care provider. Make sure you discuss any questions you have with your health care provider. Document Revised: 02/10/2021 Document Reviewed: 02/10/2021 Elsevier Patient Education  2023 [employer] Inc[employer]  

## 2022-02-07 ENCOUNTER — Ambulatory Visit (INDEPENDENT_AMBULATORY_CARE_PROVIDER_SITE_OTHER): Payer: 59

## 2022-02-07 DIAGNOSIS — E039 Hypothyroidism, unspecified: Secondary | ICD-10-CM

## 2022-02-07 NOTE — Progress Notes (Signed)
The patient returns for repeat labs today. He reports he is not taking any thyroid medications, biotin, or biotin containing products.

## 2022-02-08 LAB — TSH: TSH: 4.25 mIU/L (ref 0.40–4.50)

## 2022-02-08 NOTE — Progress Notes (Signed)
<><><><><><><><><><><><><><><><><><><><><><><><><><><><><><><><><> <><><><><><><><><><><><><><><><><><><><><><><><><><><><><><><><><>  -   Thyroid is back in normal range & no different or further treatments needed   <><><><><><><><><><><><><><><><><><><><><><><><><><><><><><><><><> <><><><><><><><><><><><><><><><><><><><><><><><><><><><><><><><><>

## 2022-02-15 ENCOUNTER — Other Ambulatory Visit: Payer: Self-pay | Admitting: Internal Medicine

## 2022-03-15 ENCOUNTER — Other Ambulatory Visit: Payer: Self-pay | Admitting: Internal Medicine

## 2022-03-15 DIAGNOSIS — E1169 Type 2 diabetes mellitus with other specified complication: Secondary | ICD-10-CM

## 2022-03-30 NOTE — Progress Notes (Deleted)
FOLLOW UP  Assessment and Plan:   Hypertension Controlled by lifestyle Monitor blood pressure at home; patient to call if consistently greater than 130/80 Continue DASH diet.   Reminder to go to the ER if any CP, SOB, nausea, dizziness, severe HA, changes vision/speech, left arm numbness and tingling and jaw pain. Check CBC  Hyperlipidemia associated with Type 2 Diabetes Mellitus(HCC) Currently above goal; taking rosuvastatin 40 mg but only BIW; pending labs will likely need to increase to daily.  LDL goal is <70 Continue low cholesterol diet and exercise.  Check lipid panel.  Check CMP  Diabetes with diabetic chronic kidney disease Continue medication: metformin 4 tabs daily, glipizide 5 mg - TID Continue diet and exercise.  Perform daily foot/skin check, notify office of any concerning changes.  Check A1C  BMI 24  Continue diet heavy in fruits and veggies and low in animal meats, cheeses, and dairy products, appropriate calorie intake Discussed ideal weight for height  Will follow up in 3 months  Vitamin D Def Below goal at last visit; admits forgetting to take regularly but will restart continue supplementation to maintain goal of 60-100 defer Vit D level  Medication Management   Abnormal Thyroid Function Test - TSH    Continue diet and meds as discussed. Further disposition pending results of labs. Discussed med's effects and SE's.   Over 30 minutes of exam, counseling, chart review, and critical decision making was performed.   Future Appointments  Date Time Provider [place]  04/02/2022  2:30 PM Alycia Rossetti, NP [facility] None  01/03/2023  3:00 PM Unk Pinto, MD [facility] None    ----------------------------------------------------------------------------------------------------------------------  HPI 57 y.o. male  presents for 3 month follow up on hypertension, cholesterol, diabetes, weight and vitamin D deficiency.   He has been  having itching in penis x 1 month. Has been using hydrocortisone with no relief. States he does not notice any discharge, does have some scaly flaking of skin around the head of the penis. Had this previously and Dr. Melford Aase prescribed Diflucan and it relieved symptoms  BMI is There is no height or weight on file to calculate BMI., he has been working on diet and exercise; he has cut out white bread, soda, switched to whole wheat/whole grains.  He eats fresh fruit (oranges). He uses splenda for sweetener.  He tries to walk/run at least 2-3 times a week.   Wt Readings from Last 3 Encounters:  02/07/22 150 lb (68 kg)  01/03/22 150 lb 12.8 oz (68.4 kg)  12/27/21 151 lb 9.6 oz (68.8 kg)   His blood pressure has been controlled at home, today their BP is   BP Readings from Last 3 Encounters:  02/07/22 106/70  01/03/22 106/70  12/27/21 128/70     He does workout. He denies chest pain, shortness of breath, dizziness.   He is on cholesterol medication (on rosuvastatin 40 mg, taking twice only on Tues/Thurs) and denies myalgias. His cholesterol is not at goal. The cholesterol last visit was:   Lab Results  Component Value Date   CHOL 197 12/27/2021   HDL 43 12/27/2021   LDLCALC  12/27/2021     Comment:     . LDL cholesterol not calculated. Triglyceride levels greater than 400 mg/dL invalidate calculated LDL results. . Reference range: <100 . Desirable range <100 mg/dL for primary prevention;   <70 mg/dL for patients with CHD or diabetic patients  with > or = 2 CHD risk factors. Marland Kitchen LDL-C is now  calculated using the Martin-Hopkins  calculation, which is a validated novel method providing  better accuracy than the Friedewald equation in the  estimation of LDL-C.  Cresenciano Genre et al. Annamaria Helling. [id number]): 2061-2068  (http://education.[facility].com/faq/FAQ164)    TRIG 655 (H) 12/27/2021   CHOLHDL 4.6 12/27/2021    He has been working on diet and exercise for T2 diabetes, and denies  foot ulcerations, increased appetite, nausea, paresthesia of the feet, polydipsia, polyuria, visual disturbances, vomiting and weight loss. He checks a fasting glucose intermittently and reports runs he reports typically in 130-140 range.  He is currently taking metformin 500 mg taking 2000 mg, glipizide 5 mg -reports taking TID. Last A1C in the office was:  Lab Results  Component Value Date   HGBA1C 10.2 (H) 12/27/2021    Patient is on Vitamin D supplement, takes 5000 IU but admits forgets , only taking 2-3 days a week   Lab Results  Component Value Date   VD25OH 23 (L) 12/27/2021       Current Medications:  Current Outpatient Medications on File Prior to Visit  Medication Sig   aspirin EC 81 MG tablet Take 81 mg by mouth daily.   Brimonidine Tartrate (LUMIFY) 0.025 % SOLN Apply to eye 2 (two) times daily.   glipiZIDE (GLUCOTROL) 5 MG tablet Take  1 tablet 2 x /day with Meals for Diabetes                                 /                         TAKE                           BY                              MOUTH   metFORMIN (GLUCOPHAGE-XR) 500 MG 24 hr tablet TAKE 2 TABLETS BY MOUTH TWICE DAILY WITH MEALS FOR DIABETES   rosuvastatin (CRESTOR) 40 MG tablet TAKE ONE TABLET BY MOUTH 2 TIMES A WEEK ON MONDAY AND THURSDAY FOR CHOLESTEROL   VITAMIN D PO Take 5,000 Units by mouth. Takes 2 to 3 times a week.   No current facility-administered medications on file prior to visit.     Allergies:  Allergies  Allergen Reactions   Poison Ivy Extract [Poison Ivy Extract] Rash     Medical History:  Past Medical History:  Diagnosis Date   Diabetes mellitus without complication ([place])    Hyperlipidemia    Vitamin D deficiency    Family history- Reviewed and unchanged Social history- Reviewed and unchanged   Review of Systems:  Review of Systems  Constitutional:  Negative for malaise/fatigue and weight loss.  HENT:  Negative for hearing loss and tinnitus.   Eyes:  Negative for blurred  vision and double vision.  Respiratory:  Negative for cough, shortness of breath and wheezing.   Cardiovascular:  Negative for chest pain, palpitations, orthopnea, claudication and leg swelling.  Gastrointestinal:  Negative for abdominal pain, blood in stool, constipation, diarrhea, heartburn, melena, nausea and vomiting.  Genitourinary: Negative.  Negative for dysuria and urgency.       Itching of skin around the head of penis  Musculoskeletal:  Negative for joint pain and myalgias.  Skin:  Negative for  rash.  Neurological:  Negative for dizziness, tingling, sensory change, weakness and headaches.  Endo/Heme/Allergies:  Negative for polydipsia.  Psychiatric/Behavioral: Negative.    All other systems reviewed and are negative.    Physical Exam: There were no vitals taken for this visit. Wt Readings from Last 3 Encounters:  02/07/22 150 lb (68 kg)  01/03/22 150 lb 12.8 oz (68.4 kg)  12/27/21 151 lb 9.6 oz (68.8 kg)   General Appearance: Well nourished, in no apparent distress. Eyes: PERRLA, EOMs, L conjunctiva no swelling or erythema, R mildly erythematous without discharge; mild pterygium noted Sinuses: No Frontal/maxillary tenderness ENT/Mouth: Ext aud canals clear, TMs without erythema, bulging. No erythema, swelling, or exudate on post pharynx.  Tonsils not swollen or erythematous. Hearing normal.  Neck: Supple, thyroid normal.  Respiratory: Respiratory effort normal, BS equal bilaterally without rales, rhonchi, wheezing or stridor.  Cardio: RRR with no MRGs. Brisk peripheral pulses without edema.  Abdomen: Soft, + BS.  Non tender, no guarding, rebound, hernias, masses. Lymphatics: Non tender without lymphadenopathy.  Musculoskeletal: Full ROM, 5/5 strength, Normal gait. He has R thumb DIP amputation.  Skin: Warm, dry without rashes, lesions, ecchymosis.  Neuro: Cranial nerves intact. No cerebellar symptoms.  Psych: Awake and oriented X 3, normal affect, Insight and Judgment  appropriate.    Alycia Rossetti, NP 11:10 AM [facility]

## 2022-04-02 ENCOUNTER — Ambulatory Visit: Payer: 59 | Admitting: Nurse Practitioner

## 2022-04-02 DIAGNOSIS — R0989 Other specified symptoms and signs involving the circulatory and respiratory systems: Secondary | ICD-10-CM

## 2022-04-02 DIAGNOSIS — E1122 Type 2 diabetes mellitus with diabetic chronic kidney disease: Secondary | ICD-10-CM

## 2022-04-02 DIAGNOSIS — E1169 Type 2 diabetes mellitus with other specified complication: Secondary | ICD-10-CM

## 2022-04-02 DIAGNOSIS — R7989 Other specified abnormal findings of blood chemistry: Secondary | ICD-10-CM

## 2022-04-02 DIAGNOSIS — E559 Vitamin D deficiency, unspecified: Secondary | ICD-10-CM

## 2022-04-02 DIAGNOSIS — Z6824 Body mass index (BMI) 24.0-24.9, adult: Secondary | ICD-10-CM

## 2022-04-02 DIAGNOSIS — Z79899 Other long term (current) drug therapy: Secondary | ICD-10-CM

## 2022-04-10 ENCOUNTER — Other Ambulatory Visit: Payer: Self-pay | Admitting: Nurse Practitioner

## 2022-04-11 ENCOUNTER — Other Ambulatory Visit: Payer: Self-pay

## 2022-04-11 MED ORDER — ROSUVASTATIN CALCIUM 40 MG PO TABS: ORAL_TABLET | ORAL | 0 refills | Status: DC

## 2022-04-11 NOTE — Progress Notes (Deleted)
FOLLOW UP  Assessment and Plan:   Hypertension Controlled by lifestyle Monitor blood pressure at home; patient to call if consistently greater than 130/80 Continue DASH diet.   Reminder to go to the ER if any CP, SOB, nausea, dizziness, severe HA, changes vision/speech, left arm numbness and tingling and jaw pain. Check CBC  Hyperlipidemia associated with Type 2 Diabetes Mellitus(HCC) Currently above goal; taking rosuvastatin 40 mg but only BIW; pending labs will likely need to increase to daily.  LDL goal is <70 Continue low cholesterol diet and exercise.  Check lipid panel.  Check CMP  Diabetes with diabetic chronic kidney disease Continue medication: metformin 4 tabs daily, glipizide 5 mg - TID Continue diet and exercise.  Perform daily foot/skin check, notify office of any concerning changes.  Check A1C  BMI 24  Continue diet heavy in fruits and veggies and low in animal meats, cheeses, and dairy products, appropriate calorie intake Discussed ideal weight for height  Will follow up in 3 months  Vitamin D Def Below goal at last visit; admits forgetting to take regularly but will restart continue supplementation to maintain goal of 60-100 defer Vit D level  Medication Management      Continue diet and meds as discussed. Further disposition pending results of labs. Discussed med's effects and SE's.   Over 30 minutes of exam, counseling, chart review, and critical decision making was performed.   Future Appointments  Date Time Provider [place]  04/12/2022 11:30 AM Alycia Rossetti, NP [facility] None  01/03/2023  3:00 PM Unk Pinto, MD [facility] None    ----------------------------------------------------------------------------------------------------------------------  HPI 57 y.o. male  presents for 3 month follow up on hypertension, cholesterol, diabetes, weight and vitamin D deficiency.   He has been having itching in penis x 1 month. Has  been using hydrocortisone with no relief. States he does not notice any discharge, does have some scaly flaking of skin around the head of the penis. Had this previously and Dr. Melford Aase prescribed Diflucan and it relieved symptoms  BMI is There is no height or weight on file to calculate BMI., he has been working on diet and exercise; he has cut out white bread, soda, switched to whole wheat/whole grains.  He eats fresh fruit (oranges). He uses splenda for sweetener.  He tries to walk/run at least 2-3 times a week.   Wt Readings from Last 3 Encounters:  02/07/22 150 lb (68 kg)  01/03/22 150 lb 12.8 oz (68.4 kg)  12/27/21 151 lb 9.6 oz (68.8 kg)   His blood pressure has been controlled at home, today their BP is   BP Readings from Last 3 Encounters:  02/07/22 106/70  01/03/22 106/70  12/27/21 128/70     He does workout. He denies chest pain, shortness of breath, dizziness.   He is on cholesterol medication (on rosuvastatin 40 mg, taking twice only on Tues/Thurs) and denies myalgias. His cholesterol is not at goal. The cholesterol last visit was:   Lab Results  Component Value Date   CHOL 197 12/27/2021   HDL 43 12/27/2021   LDLCALC  12/27/2021     Comment:     . LDL cholesterol not calculated. Triglyceride levels greater than 400 mg/dL invalidate calculated LDL results. . Reference range: <100 . Desirable range <100 mg/dL for primary prevention;   <70 mg/dL for patients with CHD or diabetic patients  with > or = 2 CHD risk factors. Marland Kitchen LDL-C is now calculated using the Martin-Hopkins  calculation, which  is a validated novel method providing  better accuracy than the Friedewald equation in the  estimation of LDL-C.  Cresenciano Genre et al. Annamaria Helling. [id number]): 2061-2068  (http://education.[facility].com/faq/FAQ164)    TRIG 655 (H) 12/27/2021   CHOLHDL 4.6 12/27/2021    He has been working on diet and exercise for T2 diabetes, and denies foot ulcerations, increased appetite,  nausea, paresthesia of the feet, polydipsia, polyuria, visual disturbances, vomiting and weight loss. He checks a fasting glucose intermittently and reports runs he reports typically in 130-140 range.  He is currently taking metformin 500 mg taking 2000 mg, glipizide 5 mg -reports taking TID. Last A1C in the office was:  Lab Results  Component Value Date   HGBA1C 10.2 (H) 12/27/2021    Patient is on Vitamin D supplement, takes 5000 IU but admits forgets , only taking 2-3 days a week   Lab Results  Component Value Date   VD25OH 23 (L) 12/27/2021       Current Medications:  Current Outpatient Medications on File Prior to Visit  Medication Sig   aspirin EC 81 MG tablet Take 81 mg by mouth daily.   Brimonidine Tartrate (LUMIFY) 0.025 % SOLN Apply to eye 2 (two) times daily.   glipiZIDE (GLUCOTROL) 5 MG tablet Take  1 tablet 2 x /day with Meals for Diabetes                                 /                         TAKE                           BY                              MOUTH   metFORMIN (GLUCOPHAGE-XR) 500 MG 24 hr tablet TAKE 2 TABLETS BY MOUTH TWICE DAILY WITH MEALS FOR DIABETES   rosuvastatin (CRESTOR) 40 MG tablet TAKE ONE TABLET BY MOUTH 2 TIMES A WEEK ON MONDAY AND THURSDAY FOR CHOLESTEROL   VITAMIN D PO Take 5,000 Units by mouth. Takes 2 to 3 times a week.   No current facility-administered medications on file prior to visit.     Allergies:  Allergies  Allergen Reactions   Poison Ivy Extract [Poison Ivy Extract] Rash     Medical History:  Past Medical History:  Diagnosis Date   Diabetes mellitus without complication ([place])    Hyperlipidemia    Vitamin D deficiency    Family history- Reviewed and unchanged Social history- Reviewed and unchanged   Review of Systems:  Review of Systems  Constitutional:  Negative for malaise/fatigue and weight loss.  HENT:  Negative for hearing loss and tinnitus.   Eyes:  Negative for blurred vision and double vision.   Respiratory:  Negative for cough, shortness of breath and wheezing.   Cardiovascular:  Negative for chest pain, palpitations, orthopnea, claudication and leg swelling.  Gastrointestinal:  Negative for abdominal pain, blood in stool, constipation, diarrhea, heartburn, melena, nausea and vomiting.  Genitourinary: Negative.  Negative for dysuria and urgency.       Itching of skin around the head of penis  Musculoskeletal:  Negative for joint pain and myalgias.  Skin:  Negative for rash.  Neurological:  Negative for dizziness,  tingling, sensory change, weakness and headaches.  Endo/Heme/Allergies:  Negative for polydipsia.  Psychiatric/Behavioral: Negative.    All other systems reviewed and are negative.    Physical Exam: There were no vitals taken for this visit. Wt Readings from Last 3 Encounters:  02/07/22 150 lb (68 kg)  01/03/22 150 lb 12.8 oz (68.4 kg)  12/27/21 151 lb 9.6 oz (68.8 kg)   General Appearance: Well nourished, in no apparent distress. Eyes: PERRLA, EOMs, L conjunctiva no swelling or erythema, R mildly erythematous without discharge; mild pterygium noted Sinuses: No Frontal/maxillary tenderness ENT/Mouth: Ext aud canals clear, TMs without erythema, bulging. No erythema, swelling, or exudate on post pharynx.  Tonsils not swollen or erythematous. Hearing normal.  Neck: Supple, thyroid normal.  Respiratory: Respiratory effort normal, BS equal bilaterally without rales, rhonchi, wheezing or stridor.  Cardio: RRR with no MRGs. Brisk peripheral pulses without edema.  Abdomen: Soft, + BS.  Non tender, no guarding, rebound, hernias, masses. Lymphatics: Non tender without lymphadenopathy.  Musculoskeletal: Full ROM, 5/5 strength, Normal gait. He has R thumb DIP amputation.  Skin: Warm, dry without rashes, lesions, ecchymosis.  Neuro: Cranial nerves intact. No cerebellar symptoms.  Psych: Awake and oriented X 3, normal affect, Insight and Judgment appropriate.    Alycia Rossetti, NP 9:39 AM [facility]

## 2022-04-12 ENCOUNTER — Ambulatory Visit: Payer: 59 | Admitting: Nurse Practitioner

## 2022-04-12 DIAGNOSIS — E559 Vitamin D deficiency, unspecified: Secondary | ICD-10-CM

## 2022-04-12 DIAGNOSIS — Z79899 Other long term (current) drug therapy: Secondary | ICD-10-CM

## 2022-04-12 DIAGNOSIS — R0989 Other specified symptoms and signs involving the circulatory and respiratory systems: Secondary | ICD-10-CM

## 2022-04-12 DIAGNOSIS — E1169 Type 2 diabetes mellitus with other specified complication: Secondary | ICD-10-CM

## 2022-04-12 DIAGNOSIS — Z6826 Body mass index (BMI) 26.0-26.9, adult: Secondary | ICD-10-CM

## 2022-04-12 DIAGNOSIS — E1122 Type 2 diabetes mellitus with diabetic chronic kidney disease: Secondary | ICD-10-CM

## 2022-04-18 NOTE — Progress Notes (Unsigned)
FOLLOW UP  Assessment and Plan:   Hypertension Controlled by lifestyle Monitor blood pressure at home; patient to call if consistently greater than 130/80 Continue DASH diet.   Reminder to go to the ER if any CP, SOB, nausea, dizziness, severe HA, changes vision/speech, left arm numbness and tingling and jaw pain. Check CBC  Hyperlipidemia associated with Type 2 Diabetes Mellitus(HCC) Currently above goal; taking rosuvastatin 40 mg but only BIW; pending labs will likely need to increase to daily.  LDL goal is <70 Continue low cholesterol diet and exercise.  Check lipid panel.  Check CMP  Diabetes with diabetic chronic kidney disease Continue medication: metformin 4 tabs daily, glipizide 5 mg - TID Continue diet and exercise.  Perform daily foot/skin check, notify office of any concerning changes.  Check A1C  BMI 24  Continue diet heavy in fruits and veggies and low in animal meats, cheeses, and dairy products, appropriate calorie intake Discussed ideal weight for height  Will follow up in 3 months  Vitamin D Def Below goal at last visit; admits forgetting to take regularly but will restart continue supplementation to maintain goal of 60-100 defer Vit D level  Medication Management   Hypothyroidism - TSH    Continue diet and meds as discussed. Further disposition pending results of labs. Discussed med's effects and SE's.   Over 30 minutes of exam, counseling, chart review, and critical decision making was performed.   Future Appointments  Date Time Provider [place]  04/19/2022 10:30 AM Alycia Rossetti, NP [facility] None  01/03/2023  3:00 PM Unk Pinto, MD [facility] None    ----------------------------------------------------------------------------------------------------------------------  HPI 57 y.o. male  presents for 3 month follow up on hypertension, cholesterol, diabetes, weight and vitamin D deficiency.   He has been having itching in  penis x 1 month. Has been using hydrocortisone with no relief. States he does not notice any discharge, does have some scaly flaking of skin around the head of the penis. Had this previously and Dr. Melford Aase prescribed Diflucan and it relieved symptoms  BMI is There is no height or weight on file to calculate BMI., he has been working on diet and exercise; he has cut out white bread, soda, switched to whole wheat/whole grains.  He eats fresh fruit (oranges). He uses splenda for sweetener.  He tries to walk/run at least 2-3 times a week.   Wt Readings from Last 3 Encounters:  02/07/22 150 lb (68 kg)  01/03/22 150 lb 12.8 oz (68.4 kg)  12/27/21 151 lb 9.6 oz (68.8 kg)   His blood pressure has been controlled at home, today their BP is   BP Readings from Last 3 Encounters:  02/07/22 106/70  01/03/22 106/70  12/27/21 128/70     He does workout. He denies chest pain, shortness of breath, dizziness.   He is on cholesterol medication (on rosuvastatin 40 mg, taking twice only on Tues/Thurs) and denies myalgias. His cholesterol is not at goal. The cholesterol last visit was:   Lab Results  Component Value Date   CHOL 197 12/27/2021   HDL 43 12/27/2021   LDLCALC  12/27/2021     Comment:     . LDL cholesterol not calculated. Triglyceride levels greater than 400 mg/dL invalidate calculated LDL results. . Reference range: <100 . Desirable range <100 mg/dL for primary prevention;   <70 mg/dL for patients with CHD or diabetic patients  with > or = 2 CHD risk factors. Marland Kitchen LDL-C is now calculated using the Martin-Hopkins  calculation, which is a validated novel method providing  better accuracy than the Friedewald equation in the  estimation of LDL-C.  Cresenciano Genre et al. Annamaria Helling. [id number]): 2061-2068  (http://education.[facility].com/faq/FAQ164)    TRIG 655 (H) 12/27/2021   CHOLHDL 4.6 12/27/2021    He has been working on diet and exercise for T2 diabetes, and denies foot ulcerations,  increased appetite, nausea, paresthesia of the feet, polydipsia, polyuria, visual disturbances, vomiting and weight loss. He checks a fasting glucose intermittently and reports runs he reports typically in 130-140 range.  He is currently taking metformin 500 mg taking 2000 mg, glipizide 5 mg -reports taking TID. Last A1C in the office was:  Lab Results  Component Value Date   HGBA1C 10.2 (H) 12/27/2021    Patient is on Vitamin D supplement, takes 5000 IU but admits forgets , only taking 2-3 days a week   Lab Results  Component Value Date   VD25OH 23 (L) 12/27/2021       Current Medications:  Current Outpatient Medications on File Prior to Visit  Medication Sig   aspirin EC 81 MG tablet Take 81 mg by mouth daily.   Brimonidine Tartrate (LUMIFY) 0.025 % SOLN Apply to eye 2 (two) times daily.   glipiZIDE (GLUCOTROL) 5 MG tablet Take  1 tablet 2 x /day with Meals for Diabetes                                 /                         TAKE                           BY                              MOUTH   metFORMIN (GLUCOPHAGE-XR) 500 MG 24 hr tablet TAKE 2 TABLETS BY MOUTH TWICE DAILY WITH MEALS FOR DIABETES   rosuvastatin (CRESTOR) 40 MG tablet TAKE ONE TABLET BY MOUTH 2 TIMES A WEEK ON MONDAY AND THURSDAY FOR CHOLESTEROL   VITAMIN D PO Take 5,000 Units by mouth. Takes 2 to 3 times a week.   No current facility-administered medications on file prior to visit.     Allergies:  Allergies  Allergen Reactions   Poison Ivy Extract [Poison Ivy Extract] Rash     Medical History:  Past Medical History:  Diagnosis Date   Diabetes mellitus without complication ([place])    Hyperlipidemia    Vitamin D deficiency    Family history- Reviewed and unchanged Social history- Reviewed and unchanged   Review of Systems:  Review of Systems  Constitutional:  Negative for malaise/fatigue and weight loss.  HENT:  Negative for hearing loss and tinnitus.   Eyes:  Negative for blurred vision and double  vision.  Respiratory:  Negative for cough, shortness of breath and wheezing.   Cardiovascular:  Negative for chest pain, palpitations, orthopnea, claudication and leg swelling.  Gastrointestinal:  Negative for abdominal pain, blood in stool, constipation, diarrhea, heartburn, melena, nausea and vomiting.  Genitourinary: Negative.  Negative for dysuria and urgency.       Itching of skin around the head of penis  Musculoskeletal:  Negative for joint pain and myalgias.  Skin:  Negative for rash.  Neurological:  Negative  for dizziness, tingling, sensory change, weakness and headaches.  Endo/Heme/Allergies:  Negative for polydipsia.  Psychiatric/Behavioral: Negative.    All other systems reviewed and are negative.    Physical Exam: There were no vitals taken for this visit. Wt Readings from Last 3 Encounters:  02/07/22 150 lb (68 kg)  01/03/22 150 lb 12.8 oz (68.4 kg)  12/27/21 151 lb 9.6 oz (68.8 kg)   General Appearance: Well nourished, in no apparent distress. Eyes: PERRLA, EOMs, L conjunctiva no swelling or erythema, R mildly erythematous without discharge; mild pterygium noted Sinuses: No Frontal/maxillary tenderness ENT/Mouth: Ext aud canals clear, TMs without erythema, bulging. No erythema, swelling, or exudate on post pharynx.  Tonsils not swollen or erythematous. Hearing normal.  Neck: Supple, thyroid normal.  Respiratory: Respiratory effort normal, BS equal bilaterally without rales, rhonchi, wheezing or stridor.  Cardio: RRR with no MRGs. Brisk peripheral pulses without edema.  Abdomen: Soft, + BS.  Non tender, no guarding, rebound, hernias, masses. Lymphatics: Non tender without lymphadenopathy.  Musculoskeletal: Full ROM, 5/5 strength, Normal gait. He has R thumb DIP amputation.  Skin: Warm, dry without rashes, lesions, ecchymosis.  Neuro: Cranial nerves intact. No cerebellar symptoms.  Psych: Awake and oriented X 3, normal affect, Insight and Judgment appropriate.     Alycia Rossetti, NP 9:39 AM [facility]

## 2022-04-19 ENCOUNTER — Encounter: Payer: Self-pay | Admitting: Nurse Practitioner

## 2022-04-19 ENCOUNTER — Ambulatory Visit (INDEPENDENT_AMBULATORY_CARE_PROVIDER_SITE_OTHER): Payer: 59 | Admitting: Nurse Practitioner

## 2022-04-19 VITALS — BP 104/62 | HR 73 | Temp 97.7°F | Ht 65.0 in | Wt 154.0 lb

## 2022-04-19 DIAGNOSIS — E559 Vitamin D deficiency, unspecified: Secondary | ICD-10-CM

## 2022-04-19 DIAGNOSIS — E039 Hypothyroidism, unspecified: Secondary | ICD-10-CM | POA: Diagnosis not present

## 2022-04-19 DIAGNOSIS — B070 Plantar wart: Secondary | ICD-10-CM

## 2022-04-19 DIAGNOSIS — E1122 Type 2 diabetes mellitus with diabetic chronic kidney disease: Secondary | ICD-10-CM

## 2022-04-19 DIAGNOSIS — Z6826 Body mass index (BMI) 26.0-26.9, adult: Secondary | ICD-10-CM

## 2022-04-19 DIAGNOSIS — E785 Hyperlipidemia, unspecified: Secondary | ICD-10-CM

## 2022-04-19 DIAGNOSIS — N181 Chronic kidney disease, stage 1: Secondary | ICD-10-CM

## 2022-04-19 DIAGNOSIS — R0989 Other specified symptoms and signs involving the circulatory and respiratory systems: Secondary | ICD-10-CM

## 2022-04-19 DIAGNOSIS — E1169 Type 2 diabetes mellitus with other specified complication: Secondary | ICD-10-CM | POA: Diagnosis not present

## 2022-04-19 DIAGNOSIS — Z79899 Other long term (current) drug therapy: Secondary | ICD-10-CM

## 2022-04-19 NOTE — Patient Instructions (Signed)
Verrugas plantares Plantar Warts Las verrugas son pequeos crecimientos en la piel. Cuando se forman en la zona inferior del pie (planta), se denominan verrugas plantares. Con frecuencia, las verrugas plantares aparecen en grupos, en los que varias verrugas pequeas rodean a una ms grande. Suelen aparecer en el taln o la regin metatarsiana de la planta del pie. Pueden crecer [employer] capas ms profundas de la piel o elevarse por encima de la superficie de la piel. [place] verrugas no son dolorosas y, por lo general, no causan problemas. Sin embargo, las verrugas plantares puede causar dolor al caminar debido a que se ejerce presin sobre ellas. Las verrugas plantares pueden diseminarse a otras reas de la planta del pie. Tambin pueden diseminarse a otras zonas del cuerpo a travs del contacto directo e indirecto. Las verrugas generalmente desaparecen por s solas con el transcurso del tiempo. Se pueden realizar diversos tratamientos, si es necesario o si se lo desea. Cules son las causas? La causa de las verrugas plantares es un tipo de virus llamado virus del Engineer, technical sales (VPH). Caminar descalzo puede dar lugar a la exposicin al virus, especialmente si los pies estn hmedos. Este virus se introduce en una herida de la piel del pie. Qu incrementa el riesgo? Es ms probable que tenga esta afeccin si: Tiene entre 10 y 64 aos de edad. [place] vestuarios o duchas pblicas. Tiene debilitado el sistema de defensa del organismo (sistema inmunitario). Cules son los signos o los sntomas? Los sntomas frecuentes de esta afeccin incluyen los siguientes: Protuberancias planas o ligeramente elevadas que tienen una superficie spera y se ven parecidas a una callosidad. Dolor al apoyar el peso del cuerpo en el pie. Cmo se diagnostica? Generalmente, las verrugas plantares se diagnostican por su aspecto. En algunos casos, se puede extraer Truddie Coco de tejido (biopsia) para analizarla  con un microscopio. Cmo se trata? En muchos casos, las verrugas no requieren Clinical research associate. Cuando no se las trata, a menudo desaparecen con el transcurso del [place]. Si es necesario Building services engineer o si se lo desea, las opciones pueden incluir lo siguiente: Armed forces operational officer, cremas o parches medicinales en la verruga. Pueden ser medicamentos recetados o de venta libre que suavizan la piel de modo que las capas se descamen gradualmente. En muchos casos, el medicamento se aplica una o dos veces al da y se cubre con un apsito. Congelar la verruga con nitrgeno lquido (crioterapia). Quemar la verruga con lo siguiente: Tratamiento con lser. Una sonda con corriente elctrica (electrocauterizacin). Inyectar un medicamento (antgeno de cndida) en la verruga para ayudar al sistema inmunitario del organismo a combatirla. Someterse a [place] para extirpar la verruga. Colocar cinta adhesiva aislante sobre la verruga (oclusin). Se dejar colocada la cinta adhesiva durante el tiempo que le haya indicado el mdico y, [place], la reemplazar por una nueva. Esto se realiza hasta que la verruga desaparece. Si decide eliminar las verrugas, es posible que sea necesario repetir el tratamiento. A veces, las verrugas desaparecen y vuelven a Arts administrator. Siga estas instrucciones en su casa: Aplique las cremas o soluciones medicinales como se lo haya indicado el mdico. Esto puede incluir lo siguiente: Remojar la zona afectada en agua tibia. Retirar la capa superficial de piel ablandada antes de aplicar el medicamento. Una piedra pmez sirve para eliminar la piel. Colocar un vendaje sobre la zona afectada despus de Oncologist. Repetir el proceso diariamente o como se lo haya indicado el mdico. No se rasque ni se toque una verruga.  Lvese las manos despus de tocar una verruga. Si una verruga le produce dolor, pruebe cubrirla con un vendaje que tenga un orificio en el medio. Esto ayuda a  quitar presin a Furniture conservator/restorer. Concurra a todas las visitas de seguimiento como se lo haya indicado el mdico. Esto es importante. Cmo se evita? Tome estas medidas para ayudar a Chemical engineer las verrugas: Use zapatos y calcetines. Cmbiese los calcetines [employer]. Mantenga los pies limpios y secos. No camine descalzo en vestuarios compartidos, zonas de duchas o piscinas. Revsese los pies con regularidad. Evite el contacto directo con las verrugas de Producer, television/film/video. Comunquese con un mdico si: Las verrugas no mejoran despus del [place]. Tiene enrojecimiento, hinchazn o Management consultant de Civil Service fast streamer. La verruga le sangra, y el sangrado no se detiene cuando ejerce una presin leve sobre la verruga. Es diabtico y Armed forces logistics/support/administrative officer. Resumen Las verrugas son pequeos crecimientos en la piel. Cuando se forman en la zona inferior del pie (planta), se denominan verrugas plantares. En muchos casos, las verrugas no requieren Clinical research associate. Cuando no se las trata, a menudo desaparecen con el transcurso del [place]. Aplique las cremas o soluciones medicinales como se lo haya indicado el mdico. No se rasque ni se toque una verruga. Lvese las manos despus de tocar una verruga. Concurra a todas las visitas de seguimiento como se lo haya indicado el mdico. Esto es importante. Esta informacin no tiene Marine scientist el consejo del mdico. Asegrese de hacerle al mdico cualquier pregunta que tenga. Document Revised: 05/12/2020 Document Reviewed: 05/12/2020 Elsevier Patient Education  [place].

## 2022-04-20 LAB — LIPID PANEL
Cholesterol: 187 mg/dL (ref <200)
HDL: 44 mg/dL (ref >=40)
LDL Cholesterol (Calc): 106 mg/dL (calc) — ABNORMAL HIGH
Non-HDL Cholesterol (Calc): 143 mg/dL (calc) — ABNORMAL HIGH (ref <130)
Total CHOL/HDL Ratio: 4.3 (calc) (ref <5.0)
Triglycerides: 261 mg/dL — ABNORMAL HIGH (ref <150)

## 2022-04-20 LAB — COMPLETE METABOLIC PANEL WITHOUT GFR
AG Ratio: 1.8 (calc) (ref 1.0–2.5)
ALT: 19 U/L (ref 9–46)
AST: 15 U/L (ref 10–35)
Albumin: 4.4 g/dL (ref 3.6–5.1)
Alkaline phosphatase (APISO): 66 U/L (ref 35–144)
BUN: 18 mg/dL (ref 7–25)
CO2: 25 mmol/L (ref 20–32)
Calcium: 9.5 mg/dL (ref 8.6–10.3)
Chloride: 100 mmol/L (ref 98–110)
Creat: 0.75 mg/dL (ref 0.70–1.30)
Globulin: 2.4 g/dL (ref 1.9–3.7)
Glucose, Bld: 285 mg/dL — ABNORMAL HIGH (ref 65–99)
Potassium: 4.4 mmol/L (ref 3.5–5.3)
Sodium: 138 mmol/L (ref 135–146)
Total Bilirubin: 0.6 mg/dL (ref 0.2–1.2)
Total Protein: 6.8 g/dL (ref 6.1–8.1)
eGFR: 107 mL/min/1.73m2

## 2022-04-20 LAB — CBC WITH DIFFERENTIAL/PLATELET
Absolute Monocytes: 426 cells/uL (ref 200–950)
Basophils Absolute: 50 cells/uL (ref 0–200)
Basophils Relative: 0.9 %
Eosinophils Absolute: 62 cells/uL (ref 15–500)
Eosinophils Relative: 1.1 %
HCT: 48.1 % (ref 38.5–50.0)
Hemoglobin: 16.1 g/dL (ref 13.2–17.1)
Lymphs Abs: 1411 cells/uL (ref 850–3900)
MCH: 29.7 pg (ref 27.0–33.0)
MCHC: 33.5 g/dL (ref 32.0–36.0)
MCV: 88.7 fL (ref 80.0–100.0)
MPV: 12.6 fL — ABNORMAL HIGH (ref 7.5–12.5)
Monocytes Relative: 7.6 %
Neutro Abs: 3651 cells/uL (ref 1500–7800)
Neutrophils Relative %: 65.2 %
Platelets: 206 10*3/uL (ref 140–400)
RBC: 5.42 10*6/uL (ref 4.20–5.80)
RDW: 12.9 % (ref 11.0–15.0)
Total Lymphocyte: 25.2 %
WBC: 5.6 10*3/uL (ref 3.8–10.8)

## 2022-04-20 LAB — HEMOGLOBIN A1C
Hgb A1c MFr Bld: 10.3 % of total Hgb — ABNORMAL HIGH (ref <5.7)
Mean Plasma Glucose: 249 mg/dL
eAG (mmol/L): 13.8 mmol/L

## 2022-04-20 LAB — TSH: TSH: 4.58 mIU/L — ABNORMAL HIGH (ref 0.40–4.50)

## 2022-04-26 ENCOUNTER — Telehealth: Payer: Self-pay | Admitting: Nurse Practitioner

## 2022-04-26 NOTE — Telephone Encounter (Signed)
Pt is needing a new prescription to reflect taking rosuvastatin 5 days a week

## 2022-04-27 ENCOUNTER — Other Ambulatory Visit: Payer: Self-pay | Admitting: Nurse Practitioner

## 2022-04-27 DIAGNOSIS — E1169 Type 2 diabetes mellitus with other specified complication: Secondary | ICD-10-CM

## 2022-04-27 MED ORDER — ROSUVASTATIN CALCIUM 40 MG PO TABS: ORAL_TABLET | ORAL | 2 refills | Status: DC

## 2022-05-06 ENCOUNTER — Other Ambulatory Visit: Payer: Self-pay | Admitting: Nurse Practitioner

## 2022-05-06 DIAGNOSIS — E1169 Type 2 diabetes mellitus with other specified complication: Secondary | ICD-10-CM

## 2022-05-14 LAB — HM DIABETES EYE EXAM

## 2022-05-22 ENCOUNTER — Encounter: Payer: Self-pay | Admitting: Internal Medicine

## 2022-07-04 ENCOUNTER — Other Ambulatory Visit: Payer: Self-pay | Admitting: Nurse Practitioner

## 2022-07-04 DIAGNOSIS — E1169 Type 2 diabetes mellitus with other specified complication: Secondary | ICD-10-CM

## 2022-08-08 NOTE — Progress Notes (Deleted)
FOLLOW UP  Assessment and Plan:   Hypertension Controlled by lifestyle Monitor blood pressure at home; patient to call if consistently greater than 130/80 Continue DASH diet.   Reminder to go to the ER if any CP, SOB, nausea, dizziness, severe HA, changes vision/speech, left arm numbness and tingling and jaw pain. Check CBC  Hyperlipidemia associated with Type 2 Diabetes Mellitus(HCC) Currently above goal; taking rosuvastatin 40 mg but only BIW; pending labs will likely need to increase to daily.  LDL goal is <70 Continue low cholesterol diet and exercise.  Check lipid panel.  Check CMP  Diabetes with diabetic chronic kidney disease Continue medication: metformin 2 tabs BID, glipizide 5 mg - BID Strongly encouraged to adhere to low simple car and saturated fat diet Perform daily foot/skin check, notify office of any concerning changes.  Check A1C  BMI 24  Continue diet heavy in fruits and veggies and low in animal meats, cheeses, and dairy products, appropriate calorie intake Discussed ideal weight for height  Will follow up in 3 months  Vitamin D Def Below goal at last visit; admits forgetting to take regularly but will restart continue supplementation to maintain goal of 60-100 defer Vit D level  Medication Management Continued  Hypothyroidism Not currently on medication - TSH    Continue diet and meds as discussed. Further disposition pending results of labs. Discussed med's effects and SE's.   Over 30 minutes of exam, counseling, chart review, and critical decision making was performed.   Future Appointments  Date Time Provider Department Center  08/09/2022 10:45 AM Raynelle Dick, NP [facility] None  01/03/2023  3:00 PM [name], MD [facility] None    ----------------------------------------------------------------------------------------------------------------------  HPI 57 y.o. male  presents for 3 month follow up on hypertension,  cholesterol, diabetes, weight and vitamin D deficiency.   He has noticed a tender raised area on right great toe x 6 weeks.  Has had wart previously removed from this area.  BMI is There is no height or weight on file to calculate BMI., he has been working on diet and exercise; he has cut out white bread, soda, switched to whole wheat/whole grains.  He eats fresh fruit (oranges). He uses splenda for sweetener.  He tries to walk/run at least 2-3 times a week.   Wt Readings from Last 3 Encounters:  04/19/22 154 lb (69.9 kg)  02/07/22 150 lb (68 kg)  01/03/22 150 lb 12.8 oz (68.4 kg)   His blood pressure has been controlled at home, today their BP is   BP Readings from Last 3 Encounters:  04/19/22 104/62  02/07/22 106/70  01/03/22 106/70  He does workout. He denies chest pain, shortness of breath, dizziness.   He is on cholesterol medication (on rosuvastatin 40 mg, taking twice only on Tues/Thurs) and denies myalgias. His cholesterol is not at goal. The cholesterol last visit was:   Lab Results  Component Value Date   CHOL 187 04/19/2022   HDL 44 04/19/2022   LDLCALC 106 (H) 04/19/2022   TRIG 261 (H) 04/19/2022   CHOLHDL 4.3 04/19/2022    He has been working on diet and exercise for T2 diabetes, and denies foot ulcerations, increased appetite, nausea, paresthesia of the feet, polydipsia, polyuria, visual disturbances, vomiting and weight loss. He checks a fasting glucose intermittently and reports runs he reports typically in 140-150 range.  He is currently taking metformin 500 mg 2 tabs BID , glipizide 5 mg -reports taking BID. Last A1C in the office was:  Lab Results  Component Value Date   HGBA1C 10.3 (H) 04/19/2022    Patient is on Vitamin D supplement, takes 5000 IU but admits forgets , only taking 2-3 days a week   Lab Results  Component Value Date   VD25OH 23 (L) 12/27/2021       Current Medications:  Current Outpatient Medications on File Prior to Visit  Medication Sig    Ascorbic Acid (VITAMIN C PO) Take by mouth.   aspirin EC 81 MG tablet Take 81 mg by mouth daily.   Brimonidine Tartrate (LUMIFY) 0.025 % SOLN Apply to eye 2 (two) times daily.   Cyanocobalamin (VITAMIN B-12 PO) Take by mouth.   Ferrous Sulfate (IRON PO) Take by mouth.   glipiZIDE (GLUCOTROL) 5 MG tablet Take  1 tablet 2 x /day with Meals for Diabetes                                 /                         TAKE                           BY                              MOUTH   metFORMIN (GLUCOPHAGE-XR) 500 MG 24 hr tablet TAKE 2 TABLETS BY MOUTH TWICE DAILY WITH MEALS FOR DIABETES   rosuvastatin (CRESTOR) 40 MG tablet TAKE ONE TABLET BY MOUTH 2 TIMES A WEEK ON MONDAY AND THURSDAY FOR CHOLESTEROL   VITAMIN D PO Take 5,000 Units by mouth. Takes 2 to 3 times a week.   No current facility-administered medications on file prior to visit.     Allergies:  Allergies  Allergen Reactions   Poison Ivy Extract [Poison Ivy Extract] Rash     Medical History:  Past Medical History:  Diagnosis Date   Diabetes mellitus without complication (HCC)    Hyperlipidemia    Vitamin D deficiency    Family history- Reviewed and unchanged Social history- Reviewed and unchanged   Review of Systems:  Review of Systems  Constitutional:  Negative for malaise/fatigue and weight loss.  HENT:  Negative for hearing loss and tinnitus.   Eyes:  Negative for blurred vision and double vision.  Respiratory:  Negative for cough, shortness of breath and wheezing.   Cardiovascular:  Negative for chest pain, palpitations, orthopnea, claudication and leg swelling.  Gastrointestinal:  Negative for abdominal pain, blood in stool, constipation, diarrhea, heartburn, melena, nausea and vomiting.  Genitourinary: Negative.  Negative for dysuria and urgency.  Musculoskeletal:  Negative for joint pain and myalgias.  Skin:  Negative for rash.       Tender raised area on right great toe  Neurological:  Negative for dizziness,  tingling, sensory change, weakness and headaches.  Endo/Heme/Allergies:  Negative for polydipsia.  Psychiatric/Behavioral: Negative.    All other systems reviewed and are negative.    Physical Exam: There were no vitals taken for this visit. Wt Readings from Last 3 Encounters:  04/19/22 154 lb (69.9 kg)  02/07/22 150 lb (68 kg)  01/03/22 150 lb 12.8 oz (68.4 kg)   General Appearance: Well nourished, in no apparent distress. Eyes: PERRLA, EOMs, L conjunctiva no swelling or erythema, R mildly erythematous  without discharge; mild pterygium noted Sinuses: No Frontal/maxillary tenderness ENT/Mouth: Ext aud canals clear, TMs without erythema, bulging. No erythema, swelling, or exudate on post pharynx.  Tonsils not swollen or erythematous. Hearing normal.  Neck: Supple, thyroid normal.  Respiratory: Respiratory effort normal, BS equal bilaterally without rales, rhonchi, wheezing or stridor.  Cardio: RRR with no MRGs. Brisk peripheral pulses without edema.  Abdomen: Soft, + BS.  Non tender, no guarding, rebound, hernias, masses. Lymphatics: Non tender without lymphadenopathy.  Musculoskeletal: Full ROM, 5/5 strength, Normal gait. He has R thumb DIP amputation.  Skin: Warm, dry . Plantars wart on medial side of right great toe Neuro: Cranial nerves intact. No cerebellar symptoms.  Psych: Awake and oriented X 3, normal affect, Insight and Judgment appropriate.   Liquid nitrogen was applied for 10-12 seconds to the skin lesions and the expected blistering or scabbing reaction explained. Do not pick at the areas. Patient reminded to expect hypopigmented scars from the procedure. Return if lesions fail to fully resolve.   Raynelle Dick, NP 9:53 AM [facility]

## 2022-08-09 ENCOUNTER — Encounter: Payer: Self-pay | Admitting: Nurse Practitioner

## 2022-08-09 ENCOUNTER — Ambulatory Visit (INDEPENDENT_AMBULATORY_CARE_PROVIDER_SITE_OTHER): Payer: 59 | Admitting: Nurse Practitioner

## 2022-08-09 ENCOUNTER — Ambulatory Visit: Payer: 59 | Admitting: Nurse Practitioner

## 2022-08-09 VITALS — BP 108/60 | HR 76 | Temp 97.7°F | Ht 65.0 in | Wt 150.4 lb

## 2022-08-09 DIAGNOSIS — E1122 Type 2 diabetes mellitus with diabetic chronic kidney disease: Secondary | ICD-10-CM | POA: Diagnosis not present

## 2022-08-09 DIAGNOSIS — E559 Vitamin D deficiency, unspecified: Secondary | ICD-10-CM

## 2022-08-09 DIAGNOSIS — E1169 Type 2 diabetes mellitus with other specified complication: Secondary | ICD-10-CM | POA: Diagnosis not present

## 2022-08-09 DIAGNOSIS — Z79899 Other long term (current) drug therapy: Secondary | ICD-10-CM

## 2022-08-09 DIAGNOSIS — R0989 Other specified symptoms and signs involving the circulatory and respiratory systems: Secondary | ICD-10-CM

## 2022-08-09 DIAGNOSIS — E039 Hypothyroidism, unspecified: Secondary | ICD-10-CM | POA: Diagnosis not present

## 2022-08-09 DIAGNOSIS — N181 Chronic kidney disease, stage 1: Secondary | ICD-10-CM

## 2022-08-09 DIAGNOSIS — E785 Hyperlipidemia, unspecified: Secondary | ICD-10-CM

## 2022-08-09 DIAGNOSIS — E663 Overweight: Secondary | ICD-10-CM

## 2022-08-09 NOTE — Patient Instructions (Signed)

## 2022-08-09 NOTE — Progress Notes (Signed)
FOLLOW UP  Assessment and Plan:   Hypertension Controlled by lifestyle Monitor blood pressure at home; patient to call if consistently greater than 130/80 Continue DASH diet.   Reminder to go to the ER if any CP, SOB, nausea, dizziness, severe HA, changes vision/speech, left arm numbness and tingling and jaw pain. Check CBC  Hyperlipidemia associated with Type 2 Diabetes Mellitus(HCC) Currently above goal; taking rosuvastatin 40 mg but only BIW; pending labs will likely need to increase to daily.  LDL goal is <70 Continue low cholesterol diet and exercise.  Check lipid panel.  Check CMP  Diabetes with diabetic chronic kidney disease Continue medication: metformin 2 tabs BID, glipizide 5 mg - BID Strongly encouraged to adhere to low simple car and saturated fat diet Perform daily foot/skin check, notify office of any concerning changes.  Check A1C  BMI 24  Continue diet heavy in fruits and veggies and low in animal meats, cheeses, and dairy products, appropriate calorie intake Discussed ideal weight for height  Will follow up in 3 months  Vitamin D Def Below goal at last visit; admits forgetting to take regularly but will restart continue supplementation to maintain goal of 60-100 defer Vit D level  Medication Management Continued  Hypothyroidism Not currently on medication - TSH    Continue diet and meds as discussed. Further disposition pending results of labs. Discussed med's effects and SE's.   Over 30 minutes of exam, counseling, chart review, and critical decision making was performed.   Future Appointments  Date Time Provider Department Center  01/03/2023  3:00 PM [name], MD [facility] None    ----------------------------------------------------------------------------------------------------------------------  HPI 57 y.o. male  presents for 3 month follow up on hypertension, cholesterol, diabetes, weight and vitamin D deficiency.    BMI is  Body mass index is 25.03 kg/m., he has been working on diet and exercise; he has cut out white bread, soda, switched to whole wheat/whole grains.  He eats fresh fruit (oranges). He uses splenda for sweetener. He has been working nights but starts back to days this week.  Will walk twice a week. Wt Readings from Last 3 Encounters:  08/09/22 150 lb 6.4 oz (68.2 kg)  04/19/22 154 lb (69.9 kg)  02/07/22 150 lb (68 kg)   His blood pressure has been controlled at home, today their BP is BP: 108/60 BP Readings from Last 3 Encounters:  08/09/22 108/60  04/19/22 104/62  02/07/22 106/70  He does workout. He denies chest pain, shortness of breath, dizziness.   He is on cholesterol medication (on rosuvastatin 40 mg, taking 4 times a week) and denies myalgias. His cholesterol is not at goal.He is limiting red meat, fried foods, dairy  The cholesterol last visit was:   Lab Results  Component Value Date   CHOL 187 04/19/2022   HDL 44 04/19/2022   LDLCALC 106 (H) 04/19/2022   TRIG 261 (H) 04/19/2022   CHOLHDL 4.3 04/19/2022    He has been working on diet and exercise for T2 diabetes, and denies foot ulcerations, increased appetite, nausea, paresthesia of the feet, polydipsia, polyuria, visual disturbances, vomiting and weight loss. He has not been checking his blood sugar because working nights.Jamal Maes to limit simple carbs. He is currently taking metformin 500 mg 2 tabs BID , glipizide 5 mg -reports taking BID with meals. Last A1C in the office was:  Lab Results  Component Value Date   HGBA1C 10.3 (H) 04/19/2022    Patient is on Vitamin D supplement, takes  5000 IU but admits forgets , only taking 2-3 days a week   Lab Results  Component Value Date   VD25OH 23 (L) 12/27/2021       Current Medications:  Current Outpatient Medications on File Prior to Visit  Medication Sig   Ascorbic Acid (VITAMIN C PO) Take by mouth.   aspirin EC 81 MG tablet Take 81 mg by mouth daily.   Brimonidine Tartrate  (LUMIFY) 0.025 % SOLN Apply to eye 2 (two) times daily.   Cyanocobalamin (VITAMIN B-12 PO) Take by mouth.   Ferrous Sulfate (IRON PO) Take by mouth.   glipiZIDE (GLUCOTROL) 5 MG tablet Take  1 tablet 2 x /day with Meals for Diabetes                                 /                         TAKE                           BY                              MOUTH   metFORMIN (GLUCOPHAGE-XR) 500 MG 24 hr tablet TAKE 2 TABLETS BY MOUTH TWICE DAILY WITH MEALS FOR DIABETES   rosuvastatin (CRESTOR) 40 MG tablet TAKE ONE TABLET BY MOUTH 2 TIMES A WEEK ON MONDAY AND THURSDAY FOR CHOLESTEROL (Patient taking differently: TAKE ONE TABLET BY MOUTH 4 TIMES A WEEK FOR CHOLESTEROL)   VITAMIN D PO Take 5,000 Units by mouth. Takes 2 to 3 times a week.   No current facility-administered medications on file prior to visit.     Allergies:  Allergies  Allergen Reactions   Poison Ivy Extract [Poison Ivy Extract] Rash     Medical History:  Past Medical History:  Diagnosis Date   Diabetes mellitus without complication (HCC)    Hyperlipidemia    Vitamin D deficiency    Family history- Reviewed and unchanged Social history- Reviewed and unchanged   Review of Systems:  Review of Systems  Constitutional:  Negative for malaise/fatigue and weight loss.  HENT:  Negative for hearing loss and tinnitus.   Eyes:  Negative for blurred vision and double vision.  Respiratory:  Negative for cough, shortness of breath and wheezing.   Cardiovascular:  Negative for chest pain, palpitations, orthopnea, claudication and leg swelling.  Gastrointestinal:  Negative for abdominal pain, blood in stool, constipation, diarrhea, heartburn, melena, nausea and vomiting.  Genitourinary: Negative.  Negative for dysuria and urgency.  Musculoskeletal:  Negative for joint pain and myalgias.  Skin:  Negative for rash.  Neurological:  Negative for dizziness, tingling, sensory change, weakness and headaches.  Endo/Heme/Allergies:  Negative  for polydipsia.  Psychiatric/Behavioral: Negative.    All other systems reviewed and are negative.    Physical Exam: BP 108/60   Pulse 76   Temp 97.7 F (36.5 C)   Ht 5\' 5"  (1.651 m)   Wt 150 lb 6.4 oz (68.2 kg)   SpO2 98%   BMI 25.03 kg/m  Wt Readings from Last 3 Encounters:  08/09/22 150 lb 6.4 oz (68.2 kg)  04/19/22 154 lb (69.9 kg)  02/07/22 150 lb (68 kg)   General Appearance: Well nourished, in no apparent distress. Eyes: PERRLA,  EOMs,conjunctiva no swelling or erythema Sinuses: No Frontal/maxillary tenderness ENT/Mouth: Ext aud canals clear, TMs without erythema, bulging. No erythema, swelling, or exudate on post pharynx.  Hearing normal.  Neck: Supple, thyroid normal.  Respiratory: Respiratory effort normal, BS equal bilaterally without rales, rhonchi, wheezing or stridor.  Cardio: RRR with no MRGs. Brisk peripheral pulses without edema.  Abdomen: Soft, + BS.  Non tender, no guarding, rebound, hernias, masses. Lymphatics: Non tender without lymphadenopathy.  Musculoskeletal: Full ROM, 5/5 strength, Normal gait.  Skin: Warm, dry . Plantars wart on medial side of right great toe Neuro: Cranial nerves intact. No cerebellar symptoms.  Psych: Awake and oriented X 3, normal affect, Insight and Judgment appropriate.   Raynelle Dick, NP 2:11 PM [facility]

## 2022-08-10 LAB — CBC WITH DIFFERENTIAL/PLATELET
Absolute Monocytes: 438 cells/uL (ref 200–950)
Basophils Absolute: 60 cells/uL (ref 0–200)
Basophils Relative: 1 %
Eosinophils Absolute: 72 cells/uL (ref 15–500)
Eosinophils Relative: 1.2 %
HCT: 46.2 % (ref 38.5–50.0)
Hemoglobin: 15.5 g/dL (ref 13.2–17.1)
Lymphs Abs: 1554 cells/uL (ref 850–3900)
MCH: 30.8 pg (ref 27.0–33.0)
MCHC: 33.5 g/dL (ref 32.0–36.0)
MCV: 91.8 fL (ref 80.0–100.0)
MPV: 12.7 fL — ABNORMAL HIGH (ref 7.5–12.5)
Monocytes Relative: 7.3 %
Neutro Abs: 3876 cells/uL (ref 1500–7800)
Neutrophils Relative %: 64.6 %
Platelets: 194 10*3/uL (ref 140–400)
RBC: 5.03 10*6/uL (ref 4.20–5.80)
RDW: 12.5 % (ref 11.0–15.0)
Total Lymphocyte: 25.9 %
WBC: 6 10*3/uL (ref 3.8–10.8)

## 2022-08-10 LAB — COMPLETE METABOLIC PANEL WITH GFR
AG Ratio: 1.9 (calc) (ref 1.0–2.5)
ALT: 23 U/L (ref 9–46)
AST: 18 U/L (ref 10–35)
Albumin: 4.2 g/dL (ref 3.6–5.1)
Alkaline phosphatase (APISO): 64 U/L (ref 35–144)
BUN: 14 mg/dL (ref 7–25)
CO2: 24 mmol/L (ref 20–32)
Calcium: 9.6 mg/dL (ref 8.6–10.3)
Chloride: 102 mmol/L (ref 98–110)
Creat: 0.96 mg/dL (ref 0.70–1.30)
Globulin: 2.2 g/dL (calc) (ref 1.9–3.7)
Glucose, Bld: 259 mg/dL — ABNORMAL HIGH (ref 65–99)
Potassium: 4 mmol/L (ref 3.5–5.3)
Sodium: 136 mmol/L (ref 135–146)
Total Bilirubin: 0.5 mg/dL (ref 0.2–1.2)
Total Protein: 6.4 g/dL (ref 6.1–8.1)
eGFR: 93 mL/min/{1.73_m2} (ref >=60)

## 2022-08-10 LAB — LIPID PANEL
Cholesterol: 115 mg/dL (ref <200)
HDL: 42 mg/dL (ref >=40)
LDL Cholesterol (Calc): 44 mg/dL (calc)
Non-HDL Cholesterol (Calc): 73 mg/dL (calc) (ref <130)
Total CHOL/HDL Ratio: 2.7 (calc) (ref <5.0)
Triglycerides: 229 mg/dL — ABNORMAL HIGH (ref <150)

## 2022-08-10 LAB — TSH: TSH: 3.67 mIU/L (ref 0.40–4.50)

## 2022-08-10 LAB — HEMOGLOBIN A1C W/OUT EAG: Hgb A1c MFr Bld: 10.7 % of total Hgb — ABNORMAL HIGH (ref <5.7)

## 2022-08-13 NOTE — Progress Notes (Signed)
Patient is aware of lab results and instructions. -e welch

## 2022-09-17 ENCOUNTER — Encounter: Payer: 59 | Admitting: Internal Medicine

## 2022-10-15 ENCOUNTER — Other Ambulatory Visit: Payer: Self-pay | Admitting: Nurse Practitioner

## 2022-10-15 DIAGNOSIS — E785 Hyperlipidemia, unspecified: Secondary | ICD-10-CM

## 2022-10-22 NOTE — Progress Notes (Unsigned)
FOLLOW UP  Assessment and Plan:   Labile Hypertension Controlled by lifestyle Monitor blood pressure at home; patient to call if consistently greater than 130/80 Continue DASH diet.   Reminder to go to the ER if any CP, SOB, nausea, dizziness, severe HA, changes vision/speech, left arm numbness and tingling and jaw pain. - CBC  Hyperlipidemia associated with Type 2 Diabetes Mellitus(HCC) Currently above goal; taking rosuvastatin 40 mg but only BIW; pending labs will likely need to increase to daily.  LDL goal is <70 Continue low cholesterol diet and exercise.  -  lipid panel.  -  CMP  Type 2 diabetes mellitus with Stage 1 CKD(HCC) Continue medication: metformin 2 tabs BID, glipizide 5 mg - BID. If A1c remains above 10 will plan to start ozempic Strongly encouraged to adhere to low simple car and saturated fat diet Perform daily foot/skin check, notify office of any concerning changes.  - A1C  Overweight Continue diet heavy in fruits and veggies and low in animal meats, cheeses, and dairy products, appropriate calorie intake Discussed ideal weight for height  Will follow up in 3 months  Vitamin D Def Below goal at last visit; admits forgetting to take regularly but will restart continue supplementation to maintain goal of 60-100 defer Vit D level   Hypothyroidism Not currently on medication - TSH  Medication management -     CBC with Differential/Platelet -     COMPLETE METABOLIC PANEL WITH GFR -     Lipid panel -     TSH -     Hemoglobin A1C w/out eAG  Flu vaccine need -     Fluzone Trivalent Flu Vaccine (Muli dose preparattion)  Mid back pain right side Continue rest and heat Monitor and if worsens notify the office      Continue diet and meds as discussed. Further disposition pending results of labs. Discussed med's effects and SE's.   Over 30 minutes of exam, counseling, chart review, and critical decision making was performed.   Future Appointments   Date Time Provider Department Center  01/03/2023  3:00 PM [name], MD [facility] None    ----------------------------------------------------------------------------------------------------------------------  HPI 57 y.o. male  presents for 3 month follow up on hypertension, cholesterol, diabetes, weight and vitamin D deficiency.   He has some right mid back pain described as aching which occurs intermittently when he is working.  It resolves once he showers and goes to bed.  BMI is Body mass index is 24.99 kg/m., he has been working on diet and exercise; he has cut out white bread, soda, switched to whole wheat/whole grains.  He eats fresh fruit (oranges). He uses splenda for sweetener. He has been working nights but starts back to days this week.  Will walk twice a week. Wt Readings from Last 3 Encounters:  10/23/22 150 lb 3.2 oz (68.1 kg)  08/09/22 150 lb 6.4 oz (68.2 kg)  04/19/22 154 lb (69.9 kg)   His blood pressure has been controlled at home without medication, today their BP is BP: 122/64 BP Readings from Last 3 Encounters:  10/23/22 122/64  08/09/22 108/60  04/19/22 104/62  He does workout. He denies chest pain, shortness of breath, dizziness.   He is on cholesterol medication (on rosuvastatin 40 mg, taking 4 times a week) and denies myalgias. His cholesterol is not at goal.He is limiting red meat, fried foods, dairy  The cholesterol last visit was:   Lab Results  Component Value Date   CHOL 115 08/09/2022  HDL 42 08/09/2022   LDLCALC 44 08/09/2022   TRIG 229 (H) 08/09/2022   CHOLHDL 2.7 08/09/2022    He has been working on diet and exercise for T2 diabetes, and denies foot ulcerations, increased appetite, nausea, paresthesia of the feet, polydipsia, polyuria, visual disturbances, vomiting and weight loss. He has not been checking his blood sugar because working nights.Jamal Maes to limit simple carbs. He is currently taking metformin 500 mg 2 tabs BID ,  glipizide 5 mg -reports taking BID with meals. Last A1C in the office was:  Lab Results  Component Value Date   HGBA1C 10.7 (H) 08/09/2022   Lab Results  Component Value Date   EGFR 93 08/09/2022     Patient is on Vitamin D supplement, takes 5000 IU but admits forgets , only taking 2-3 days a week   Lab Results  Component Value Date   VD25OH 23 (L) 12/27/2021       Current Medications:  Current Outpatient Medications on File Prior to Visit  Medication Sig   Ascorbic Acid (VITAMIN C PO) Take by mouth.   aspirin EC 81 MG tablet Take 81 mg by mouth daily.   Brimonidine Tartrate (LUMIFY) 0.025 % SOLN Apply to eye 2 (two) times daily.   Cyanocobalamin (VITAMIN B-12 PO) Take by mouth.   Ferrous Sulfate (IRON PO) Take by mouth.   glipiZIDE (GLUCOTROL) 5 MG tablet Take  1 tablet 2 x /day with Meals for Diabetes                                 /                         TAKE                           BY                              MOUTH   metFORMIN (GLUCOPHAGE-XR) 500 MG 24 hr tablet TAKE 2 TABLETS BY MOUTH TWICE DAILY WITH MEALS FOR DIABETES   Multiple Vitamin (MULTI VITAMIN DAILY PO) Take by mouth.   rosuvastatin (CRESTOR) 40 MG tablet TAKE ONE TABLET BY MOUTH 2 TIMES A WEEK ON MONDAY AND THURSDAY FOR CHOLESTEROL (Patient taking differently: TAKE ONE TABLET BY MOUTH 4 TIMES A WEEK FOR CHOLESTEROL)   VITAMIN D PO Take 5,000 Units by mouth. Takes 2 to 3 times a week. (Patient not taking: Reported on 10/23/2022)   No current facility-administered medications on file prior to visit.     Allergies:  Allergies  Allergen Reactions   Poison Ivy Extract [Poison Ivy Extract] Rash     Medical History:  Past Medical History:  Diagnosis Date   Diabetes mellitus without complication (HCC)    Hyperlipidemia    Vitamin D deficiency    Family history- Reviewed and unchanged Social history- Reviewed and unchanged   Review of Systems:  Review of Systems  Constitutional:  Negative for  malaise/fatigue and weight loss.  HENT:  Negative for hearing loss and tinnitus.   Eyes:  Negative for blurred vision and double vision.  Respiratory:  Negative for cough, shortness of breath and wheezing.   Cardiovascular:  Negative for chest pain, palpitations, orthopnea, claudication and leg swelling.  Gastrointestinal:  Negative for abdominal pain,  blood in stool, constipation, diarrhea, heartburn, melena, nausea and vomiting.  Genitourinary: Negative.  Negative for dysuria and urgency.  Musculoskeletal:  Positive for back pain (right mid back intermittent). Negative for joint pain and myalgias.  Skin:  Negative for rash.  Neurological:  Negative for dizziness, tingling, sensory change, weakness and headaches.  Endo/Heme/Allergies:  Negative for polydipsia.  Psychiatric/Behavioral: Negative.    All other systems reviewed and are negative.    Physical Exam: BP 122/64   Pulse 73   Temp 97.7 F (36.5 C)   Ht 5\' 5"  (1.651 m)   Wt 150 lb 3.2 oz (68.1 kg)   SpO2 94%   BMI 24.99 kg/m  Wt Readings from Last 3 Encounters:  10/23/22 150 lb 3.2 oz (68.1 kg)  08/09/22 150 lb 6.4 oz (68.2 kg)  04/19/22 154 lb (69.9 kg)   General Appearance: Well nourished, in no apparent distress. Eyes: PERRLA, EOMs,conjunctiva no swelling or erythema Sinuses: No Frontal/maxillary tenderness ENT/Mouth: Ext aud canals clear, TMs without erythema, bulging. No erythema, swelling, or exudate on post pharynx.  Hearing normal.  Neck: Supple, thyroid normal.  Respiratory: Respiratory effort normal, BS equal bilaterally without rales, rhonchi, wheezing or stridor.  Cardio: RRR with no MRGs. Brisk peripheral pulses without edema.  Abdomen: Soft, + BS.  Non tender, no guarding, rebound, hernias, masses. Lymphatics: Non tender without lymphadenopathy.  Musculoskeletal: Full ROM, 5/5 strength, Normal gait. No back pain on exam today Skin: Warm, dry . Plantars wart on medial side of right great toe Neuro: Cranial  nerves intact. No cerebellar symptoms.  Psych: Awake and oriented X 3, normal affect, Insight and Judgment appropriate.   Raynelle Dick, NP 4:16 PM [facility]

## 2022-10-23 ENCOUNTER — Ambulatory Visit (INDEPENDENT_AMBULATORY_CARE_PROVIDER_SITE_OTHER): Payer: 59 | Admitting: Nurse Practitioner

## 2022-10-23 ENCOUNTER — Encounter: Payer: Self-pay | Admitting: Nurse Practitioner

## 2022-10-23 VITALS — BP 122/64 | HR 73 | Temp 97.7°F | Ht 65.0 in | Wt 150.2 lb

## 2022-10-23 DIAGNOSIS — Z23 Encounter for immunization: Secondary | ICD-10-CM | POA: Diagnosis not present

## 2022-10-23 DIAGNOSIS — E039 Hypothyroidism, unspecified: Secondary | ICD-10-CM | POA: Diagnosis not present

## 2022-10-23 DIAGNOSIS — R0989 Other specified symptoms and signs involving the circulatory and respiratory systems: Secondary | ICD-10-CM | POA: Diagnosis not present

## 2022-10-23 DIAGNOSIS — E785 Hyperlipidemia, unspecified: Secondary | ICD-10-CM

## 2022-10-23 DIAGNOSIS — E559 Vitamin D deficiency, unspecified: Secondary | ICD-10-CM

## 2022-10-23 DIAGNOSIS — E1122 Type 2 diabetes mellitus with diabetic chronic kidney disease: Secondary | ICD-10-CM

## 2022-10-23 DIAGNOSIS — N181 Chronic kidney disease, stage 1: Secondary | ICD-10-CM

## 2022-10-23 DIAGNOSIS — E663 Overweight: Secondary | ICD-10-CM

## 2022-10-23 DIAGNOSIS — Z79899 Other long term (current) drug therapy: Secondary | ICD-10-CM

## 2022-10-23 DIAGNOSIS — E1169 Type 2 diabetes mellitus with other specified complication: Secondary | ICD-10-CM

## 2022-10-23 DIAGNOSIS — M549 Dorsalgia, unspecified: Secondary | ICD-10-CM

## 2022-10-23 NOTE — Patient Instructions (Signed)
Acute Back Pain, Adult Acute back pain is sudden and usually short-lived. It is often caused by an injury to the muscles and tissues in the back. The injury may result from: A muscle, tendon, or ligament getting overstretched or torn. Ligaments are tissues that connect bones to each other. Lifting something improperly can cause a back strain. Wear and tear (degeneration) of the spinal disks. Spinal disks are circular tissue that provide cushioning between the bones of the spine (vertebrae). Twisting motions, such as while playing sports or doing yard work. A hit to the back. Arthritis. You may have a physical exam, lab tests, and imaging tests to find the cause of your pain. Acute back pain usually goes away with rest and home care. Follow these instructions at home: Managing pain, stiffness, and swelling Take over-the-counter and prescription medicines only as told by your health care provider. Treatment may include medicines for pain and inflammation that are taken by mouth or applied to the skin, or muscle relaxants. Your health care provider may recommend applying ice during the first 24-48 hours after your pain starts. To do this: Put ice in a plastic bag. Place a towel between your skin and the bag. Leave the ice on for 20 minutes, 2-3 times a day. Remove the ice if your skin turns bright red. This is very important. If you cannot feel pain, heat, or cold, you have a greater risk of damage to the area. If directed, apply heat to the affected area as often as told by your health care provider. Use the heat source that your health care provider recommends, such as a moist heat pack or a heating pad. Place a towel between your skin and the heat source. Leave the heat on for 20-30 minutes. Remove the heat if your skin turns bright red. This is especially important if you are unable to feel pain, heat, or cold. You have a greater risk of getting burned. Activity  Do not stay in bed. Staying in  bed for more than 1-2 days can delay your recovery. Sit up and stand up straight. Avoid leaning forward when you sit or hunching over when you stand. If you work at a desk, sit close to it so you do not need to lean over. Keep your chin tucked in. Keep your neck drawn back, and keep your elbows bent at a 90-degree angle (right angle). Sit high and close to the steering wheel when you drive. Add lower back (lumbar) support to your car seat, if needed. Take short walks on even surfaces as soon as you are able. Try to increase the length of time you walk each day. Do not sit, drive, or stand in one place for more than 30 minutes at a time. Sitting or standing for long periods of time can put stress on your back. Do not drive or use heavy machinery while taking prescription pain medicine. Use proper lifting techniques. When you bend and lift, use positions that put less stress on your back: Bend your knees. Keep the load close to your body. Avoid twisting. Exercise regularly as told by your health care provider. Exercising helps your back heal faster and helps prevent back injuries by keeping muscles strong and flexible. Work with a physical therapist to make a safe exercise program, as recommended by your health care provider. Do any exercises as told by your physical therapist. Lifestyle Maintain a healthy weight. Extra weight puts stress on your back and makes it difficult to have good   posture. Avoid activities or situations that make you feel anxious or stressed. Stress and anxiety increase muscle tension and can make back pain worse. Learn ways to manage anxiety and stress, such as through exercise. General instructions Sleep on a firm mattress in a comfortable position. Try lying on your side with your knees slightly bent. If you lie on your back, put a pillow under your knees. Keep your head and neck in a straight line with your spine (neutral position) when using electronic equipment like  smartphones or pads. To do this: Raise your smartphone or pad to look at it instead of bending your head or neck to look down. Put the smartphone or pad at the level of your face while looking at the screen. Follow your treatment plan as told by your health care provider. This may include: Cognitive or behavioral therapy. Acupuncture or massage therapy. Meditation or yoga. Contact a health care provider if: You have pain that is not relieved with rest or medicine. You have increasing pain going down into your legs or buttocks. Your pain does not improve after 2 weeks. You have pain at night. You lose weight without trying. You have a fever or chills. You develop nausea or vomiting. You develop abdominal pain. Get help right away if: You develop new bowel or bladder control problems. You have unusual weakness or numbness in your arms or legs. You feel faint. These symptoms may represent a serious problem that is an emergency. Do not wait to see if the symptoms will go away. Get medical help right away. Call your local emergency services (911 in the [place] Do not drive yourself to the hospital. Summary Acute back pain is sudden and usually short-lived. Use proper lifting techniques. When you bend and lift, use positions that put less stress on your back. Take over-the-counter and prescription medicines only as told by your health care provider, and apply heat or ice as told. This information is not intended to replace advice given to you by your health care provider. Make sure you discuss any questions you have with your health care provider. Document Revised: 04/01/2020 Document Reviewed: 04/01/2020 Elsevier Patient Education  2024 [employer] Inc[employer]  

## 2022-10-25 ENCOUNTER — Other Ambulatory Visit: Payer: Self-pay | Admitting: Nurse Practitioner

## 2022-10-25 DIAGNOSIS — N181 Chronic kidney disease, stage 1: Secondary | ICD-10-CM

## 2022-10-25 DIAGNOSIS — E1169 Type 2 diabetes mellitus with other specified complication: Secondary | ICD-10-CM

## 2022-10-25 LAB — CBC WITH DIFFERENTIAL/PLATELET
Absolute Monocytes: 459 {cells}/uL (ref 200–950)
Basophils Absolute: 50 {cells}/uL (ref 0–200)
Basophils Relative: 0.8 %
Eosinophils Absolute: 50 {cells}/uL (ref 15–500)
Eosinophils Relative: 0.8 %
HCT: 49.6 % (ref 38.5–50.0)
Hemoglobin: 16.4 g/dL (ref 13.2–17.1)
Lymphs Abs: 1686 {cells}/uL (ref 850–3900)
MCH: 30.3 pg (ref 27.0–33.0)
MCHC: 33.1 g/dL (ref 32.0–36.0)
MCV: 91.7 fL (ref 80.0–100.0)
MPV: 12.1 fL (ref 7.5–12.5)
Monocytes Relative: 7.4 %
Neutro Abs: 3956 {cells}/uL (ref 1500–7800)
Neutrophils Relative %: 63.8 %
Platelets: 207 10*3/uL (ref 140–400)
RBC: 5.41 10*6/uL (ref 4.20–5.80)
RDW: 12.3 % (ref 11.0–15.0)
Total Lymphocyte: 27.2 %
WBC: 6.2 10*3/uL (ref 3.8–10.8)

## 2022-10-25 LAB — LIPID PANEL
Cholesterol: 123 mg/dL (ref <200)
HDL: 45 mg/dL (ref >=40)
LDL Cholesterol (Calc): 46 mg/dL
Non-HDL Cholesterol (Calc): 78 mg/dL (ref <130)
Total CHOL/HDL Ratio: 2.7 (calc) (ref <5.0)
Triglycerides: 271 mg/dL — ABNORMAL HIGH (ref <150)

## 2022-10-25 LAB — COMPLETE METABOLIC PANEL WITH GFR
AG Ratio: 2 (calc) (ref 1.0–2.5)
ALT: 27 U/L (ref 9–46)
AST: 19 U/L (ref 10–35)
Albumin: 4.7 g/dL (ref 3.6–5.1)
Alkaline phosphatase (APISO): 77 U/L (ref 35–144)
BUN: 11 mg/dL (ref 7–25)
CO2: 26 mmol/L (ref 20–32)
Calcium: 9.9 mg/dL (ref 8.6–10.3)
Chloride: 100 mmol/L (ref 98–110)
Creat: 0.78 mg/dL (ref 0.70–1.30)
Globulin: 2.4 g/dL (ref 1.9–3.7)
Glucose, Bld: 382 mg/dL — ABNORMAL HIGH (ref 65–99)
Potassium: 4.6 mmol/L (ref 3.5–5.3)
Sodium: 135 mmol/L (ref 135–146)
Total Bilirubin: 0.4 mg/dL (ref 0.2–1.2)
Total Protein: 7.1 g/dL (ref 6.1–8.1)
eGFR: 105 mL/min/{1.73_m2} (ref >=60)

## 2022-10-25 LAB — TSH: TSH: 3.89 m[IU]/L (ref 0.40–4.50)

## 2022-10-25 LAB — HEMOGLOBIN A1C W/OUT EAG: Hgb A1c MFr Bld: 11 %{Hb} — ABNORMAL HIGH (ref <5.7)

## 2022-10-25 MED ORDER — SEMAGLUTIDE(0.25 OR 0.5MG/DOS) 2 MG/3ML ~~LOC~~ SOPN
PEN_INJECTOR | SUBCUTANEOUS | 2 refills | Status: AC
Start: 2022-10-25 — End: ?

## 2022-10-29 ENCOUNTER — Ambulatory Visit: Payer: 59

## 2022-12-17 ENCOUNTER — Other Ambulatory Visit: Payer: Self-pay | Admitting: Internal Medicine

## 2022-12-17 DIAGNOSIS — E119 Type 2 diabetes mellitus without complications: Secondary | ICD-10-CM

## 2023-01-03 ENCOUNTER — Encounter: Payer: 59 | Admitting: Internal Medicine

## 2023-01-08 ENCOUNTER — Other Ambulatory Visit: Payer: Self-pay | Admitting: Nurse Practitioner

## 2023-01-08 DIAGNOSIS — E785 Hyperlipidemia, unspecified: Secondary | ICD-10-CM

## 2023-02-06 ENCOUNTER — Encounter: Payer: Self-pay | Admitting: Internal Medicine

## 2023-02-06 NOTE — Progress Notes (Signed)
[facility]      ADULT   &   ADOLESCENT      INTERNAL MEDICINE  [name], M.D.          Rance Muir, ANP        Adela Glimpse, FNP  [facility] [address]  [place], [place]. [address] Telephone [phone] Telefax [phone]  Annual  Screening/Preventative Visit  & Comprehensive Evaluation & Examination   Future Appointments  Date Time Provider Department  02/07/2023  2:00 PM [name], MD [facility]  02/17/2024  3:00 PM [name], MD [facility]             This very nice 58 y.o. married [name] with HTN, HLD, T2_NIDDM  and Vitamin D Deficiency  presents for a Screening /Preventative Visit & comprehensive evaluation and management of multiple medical co-morbidities.         Patient has been followed expectantly with labile HTN. Patient's BP has been controlled at home.  Today's BP is at goal - 130/80 .   Patient denies any cardiac symptoms as chest pain, palpitations, shortness of breath, dizziness or ankle swelling.       Patient's hyperlipidemia is not controlled with diet and Rosuvastatin was added at last OV.   Patient denies myalgias or other medication SE's. Last lipids were at goal  except elevated Trig's :   Lab Results  Component Value Date   CHOL 123 10/23/2022   HDL 45 10/23/2022   LDLCALC 46 10/23/2022   TRIG 271 (H) 10/23/2022   CHOLHDL 2.7 10/23/2022         Patient has hx/o T2_NIDDM (A1c 6.7% /2013) and  is taking Metformin & Glipizide.   Patient denies reactive hypoglycemic symptoms, visual blurring, diabetic polys or paresthesias.   Patient admits poor dietary compliance and last A1c was not at goal:   Lab Results  Component Value Date   HGBA1C 11.0 (H) 10/23/2022         Finally, patient has history of Vitamin D Deficiency ("24" /2013 & "24" /2016) and patient has not taken recommended vitamin D and last vitamin D was still not at goal:   Lab Results  Component Value Date   VD25OH 23 (L)  12/27/2021        Current Outpatient Medications  Medication Instructions   VITAMIN C  Oral   aspirin EC   81 mg Daily   Brimonidine Tartrate (LUMIFY) 0.025 % SOLN Ophthalmic, 2 times daily   B-12  Oral   Ferrous Sulfate (IRON  Oral   glipiZIDE 5 MG tablet TAKE 1 TABLET  TWICE DAILY WITH MEALS    metFORMIN-XR) 500 MG  TAKE 2 TABLETS TWICE DAILY WITH MEALS    Multiple Vitamin  Oral   rosuvastatin 40 MG tablet TAKE ONE TABLET BY MOUTH 2 TIMES A WEEK ON MONDAY AND THURSDAY FOR CHOLESTEROL   Semaglutide,0.25 or 0.5MG /DOS, 2 MG/3ML SOPN Inject 0.25 mg dose the first week, if no nausea then increase to 0.5 mg once a week      Allergies  Allergen Reactions   Poison Ivy Extract [Poison Ivy Extract] Rash     Past Medical History:  Diagnosis Date   Diabetes mellitus without complication (HCC)    Hyperlipidemia    Vitamin D deficiency      Health Maintenance  Topic Date Due   COVID-19 Vaccine (1) Never done   Zoster Vaccines- Shingrix (1 of 2) Never done   Pneumococcal Vaccine  03/30/2018  Fecal DNA (Cologuard)  04/18/2020   FOOT EXAM  07/04/2020   URINE MICROALBUMIN  07/05/2020   HEMOGLOBIN A1C  08/03/2020   INFLUENZA VACCINE  08/22/2020   OPHTHALMOLOGY EXAM  11/02/2020   TETANUS/TDAP  12/10/2021   PNEUMOCOCCAL POLYSACCHARIDE VACCINE AGE 36-64 HIGH RISK  Completed   Hepatitis C Screening  Completed   HIV Screening  Completed   HPV VACCINES  Aged Out     Immunization History  Administered Date(s) Administered   Influenza Inj Mdck Quad  11/05/2017, 11/06/2018   Influenza 12/29/2013   Influenza, Seasonal 11/11/2015   PPD Test 03/29/2017, 04/25/2018, 07/06/2019   Pneumococcal - 23 12/11/2011, 03/29/2017   Tdap 12/11/2011    04/19/2017 - Cologard -Negative  - 3 year f/u  Cologard in Apr 2022 (Negative) - > recc 3 year f/u due Apr 2025  Past Surgical History:  Procedure Laterality Date   CATARACT EXTRACTION Left 09/2016   LITHOTRIPSY  2002     Family History   Problem Relation Age of Onset   Cancer Mother    Diabetes Father     Social History   Socioeconomic History   Marital status: Married     Spouse still residing in [place]  Occupational History   Works in Personal assistant services  Tobacco Use   Smoking status: Never   Smokeless tobacco: Never  Substance and Sexual Activity   Alcohol use: Yes    Comment: rarely drinks   Drug use: No   Sexual activity: Not on file      ROS Constitutional: Denies fever, chills, weight loss/gain, headaches, insomnia,  night sweats or change in appetite. Does c/o fatigue. Eyes: Denies redness, blurred vision, diplopia, discharge, itchy or watery eyes.  ENT: Denies discharge, congestion, post nasal drip, epistaxis, sore throat, earache, hearing loss, dental pain, Tinnitus, Vertigo, Sinus pain or snoring.  Cardio: Denies chest pain, palpitations, irregular heartbeat, syncope, dyspnea, diaphoresis, orthopnea, PND, claudication or edema Respiratory: denies cough, dyspnea, DOE, pleurisy, hoarseness, laryngitis or wheezing.  Gastrointestinal: Denies dysphagia, heartburn, reflux, water brash, pain, cramps, nausea, vomiting, bloating, diarrhea, constipation, hematemesis, melena, hematochezia, jaundice or hemorrhoids Genitourinary: Denies dysuria, frequency, urgency, nocturia, hesitancy, discharge, hematuria or flank pain Musculoskeletal: Denies arthralgia, myalgia, stiffness, Jt. Swelling, pain, limp or strain/sprain. Denies Falls. Skin: Denies puritis, rash, hives, warts, acne, eczema or change in skin lesion Neuro: No weakness, tremor, incoordination, spasms, paresthesia or pain Psychiatric: Denies confusion, memory loss or sensory loss. Denies Depression. Endocrine: Denies change in weight, skin, hair change, nocturia, and paresthesia, diabetic polys, visual blurring or hyper / hypo glycemic episodes.  Heme/Lymph: No excessive bleeding, bruising or enlarged lymph nodes.   Physical Exam  BP 130/80    Pulse 86   Temp 97.9 F (36.6 C)   Resp 16   Ht 5\' 5"  (1.651 m)   Wt 146 lb 12.8 oz (66.6 kg)   SpO2 99%   BMI 24.43 kg/m   General Appearance: Well nourished and well groomed and in no apparent distress. Dry cough. Sl hoarse.   Eyes: PERRLA, EOMs, conjunctiva no swelling or erythema, normal fundi and vessels. Sinuses: No frontal/maxillary tenderness ENT/Mouth: EACs patent / TMs  nl. Nares clear without erythema, swelling, mucoid exudates. Oral hygiene is good. No erythema, swelling, or exudate. Tongue normal, non-obstructing. Tonsils not swollen or erythematous. Hearing normal.  Neck: Supple, thyroid not palpable. No bruits, nodes or JVD. Respiratory:  BS with few scattered bilateral rales and No  rhonci, wheezing or stridor. Cardio: Heart sounds are normal  with regular rate and rhythm and no murmurs, rubs or gallops. Peripheral pulses are normal and equal bilaterally without edema. No aortic or femoral bruits. Chest: symmetric with normal excursions and percussion.  Abdomen: Soft, with Nl bowel sounds. Nontender, no guarding, rebound, hernias, masses, or organomegaly.  Lymphatics: Non tender without lymphadenopathy.  Musculoskeletal: Full ROM all peripheral extremities, joint stability, 5/5 strength, and normal gait. Skin: Warm and dry without rashes, lesions, cyanosis, clubbing or  ecchymosis.  Neuro: Cranial nerves intact, reflexes equal bilaterally. Normal muscle tone, no cerebellar symptoms. Sensation intact.  Pysch: Alert and oriented X 3 with normal affect, insight and judgment appropriate.   Assessment and Plan  1. Annual Preventative/Screening Exam    2. Labile hypertension  - EKG 12-Lead - [place], RETROPERITNL ABD,  LTD - Urinalysis, Routine w reflex microscopic - Microalbumin / creatinine urine ratio - CBC with Differential/Platelet - COMPLETE METABOLIC PANEL WITH GFR - Magnesium - TSH  3. Hyperlipidemia associated with type 2 diabetes mellitus (HCC)  - EKG  12-Lead - [place], RETROPERITNL ABD,  LTD - Lipid panel - TSH  4. Type 2 diabetes mellitus with stage 1 chronic kidney  disease, without long-term current use of insulin (HCC)  - EKG 12-Lead - [place], RETROPERITNL ABD,  LTD - HM DIABETES FOOT EXAM - LOW EXTREMITY NEUR EXAM DOCUM - Hemoglobin A1c - Insulin, random  5. Vitamin D deficiency  - VITAMIN D 25 Hydroxy   6. Prostate cancer screening  - PSA  7. Screening-pulmonary TB  - TB Skin Test  8. Screening for colorectal cancer  - Cologuard  9. Screening for ischemic heart disease  - EKG 12-Lead  10. Screening for AAA (aortic abdominal aneurysm)  - [place], RETROPERITNL ABD,  LTD  11. Fatigue  - Iron, Total/Total Iron Binding Cap - Vitamin B12 - Testosterone - CBC with Differential/Platelet - TSH  12. Medication management  - Urinalysis, Routine w reflex microscopic - Microalbumin / creatinine urine ratio - CBC with Differential/Platelet - COMPLETE METABOLIC PANEL WITH GFR - Magnesium - Lipid panel - TSH - Hemoglobin A1c - Insulin, random - VITAMIN D 25 Hydroxy          Patient was counseled in prudent diet, weight control to achieve/maintain BMI less than 25, BP monitoring, regular exercise and medications as discussed.   Rx- Zpak , Decadron taper & Tessalon perles for his tracheobronchitis. Discussed med effects and SE's. Routine screening labs and tests as requested with regular follow-up as recommended. Over 40 minutes of exam, counseling, chart review and high complex critical decision making was performed   Marinus Maw, MD

## 2023-02-07 ENCOUNTER — Ambulatory Visit (INDEPENDENT_AMBULATORY_CARE_PROVIDER_SITE_OTHER): Payer: 59 | Admitting: Internal Medicine

## 2023-02-07 VITALS — BP 130/80 | HR 86 | Temp 97.9°F | Resp 16 | Ht 65.0 in | Wt 146.8 lb

## 2023-02-07 DIAGNOSIS — Z136 Encounter for screening for cardiovascular disorders: Secondary | ICD-10-CM | POA: Diagnosis not present

## 2023-02-07 DIAGNOSIS — Z0000 Encounter for general adult medical examination without abnormal findings: Secondary | ICD-10-CM | POA: Diagnosis not present

## 2023-02-07 DIAGNOSIS — Z79899 Other long term (current) drug therapy: Secondary | ICD-10-CM

## 2023-02-07 DIAGNOSIS — E559 Vitamin D deficiency, unspecified: Secondary | ICD-10-CM

## 2023-02-07 DIAGNOSIS — E1169 Type 2 diabetes mellitus with other specified complication: Secondary | ICD-10-CM

## 2023-02-07 DIAGNOSIS — R0989 Other specified symptoms and signs involving the circulatory and respiratory systems: Secondary | ICD-10-CM

## 2023-02-07 DIAGNOSIS — I700 Atherosclerosis of aorta: Secondary | ICD-10-CM

## 2023-02-07 DIAGNOSIS — Z111 Encounter for screening for respiratory tuberculosis: Secondary | ICD-10-CM

## 2023-02-07 DIAGNOSIS — R5383 Other fatigue: Secondary | ICD-10-CM

## 2023-02-07 DIAGNOSIS — Z0001 Encounter for general adult medical examination with abnormal findings: Secondary | ICD-10-CM

## 2023-02-07 DIAGNOSIS — Z1211 Encounter for screening for malignant neoplasm of colon: Secondary | ICD-10-CM

## 2023-02-07 DIAGNOSIS — E1122 Type 2 diabetes mellitus with diabetic chronic kidney disease: Secondary | ICD-10-CM

## 2023-02-07 DIAGNOSIS — Z125 Encounter for screening for malignant neoplasm of prostate: Secondary | ICD-10-CM

## 2023-02-08 LAB — CBC WITH DIFFERENTIAL/PLATELET
Absolute Lymphocytes: 1669 {cells}/uL (ref 850–3900)
Absolute Monocytes: 454 {cells}/uL (ref 200–950)
Basophils Absolute: 57 {cells}/uL (ref 0–200)
Basophils Relative: 0.8 %
Eosinophils Absolute: 92 {cells}/uL (ref 15–500)
Eosinophils Relative: 1.3 %
HCT: 49.1 % (ref 38.5–50.0)
Hemoglobin: 16.2 g/dL (ref 13.2–17.1)
MCH: 30.3 pg (ref 27.0–33.0)
MCHC: 33 g/dL (ref 32.0–36.0)
MCV: 91.8 fL (ref 80.0–100.0)
MPV: 12.5 fL (ref 7.5–12.5)
Monocytes Relative: 6.4 %
Neutro Abs: 4828 {cells}/uL (ref 1500–7800)
Neutrophils Relative %: 68 %
Platelets: 229 10*3/uL (ref 140–400)
RBC: 5.35 10*6/uL (ref 4.20–5.80)
RDW: 12.5 % (ref 11.0–15.0)
Total Lymphocyte: 23.5 %
WBC: 7.1 10*3/uL (ref 3.8–10.8)

## 2023-02-08 LAB — LIPID PANEL
Cholesterol: 147 mg/dL (ref <200)
HDL: 48 mg/dL (ref >=40)
LDL Cholesterol (Calc): 71 mg/dL
Non-HDL Cholesterol (Calc): 99 mg/dL (ref <130)
Total CHOL/HDL Ratio: 3.1 (calc) (ref <5.0)
Triglycerides: 210 mg/dL — ABNORMAL HIGH (ref <150)

## 2023-02-08 LAB — MICROALBUMIN / CREATININE URINE RATIO
Creatinine, Urine: 56 mg/dL (ref 20–320)
Microalb Creat Ratio: 14 mg/g{creat} (ref <30)
Microalb, Ur: 0.8 mg/dL

## 2023-02-08 LAB — URINALYSIS, ROUTINE W REFLEX MICROSCOPIC
Bilirubin Urine: NEGATIVE
Hgb urine dipstick: NEGATIVE
Ketones, ur: NEGATIVE
Leukocytes,Ua: NEGATIVE
Nitrite: NEGATIVE
Protein, ur: NEGATIVE
Specific Gravity, Urine: 1.022 (ref 1.001–1.035)
pH: 6 (ref 5.0–8.0)

## 2023-02-08 LAB — HEMOGLOBIN A1C
Hgb A1c MFr Bld: 10.9 %{Hb} — ABNORMAL HIGH (ref <5.7)
Mean Plasma Glucose: 266 mg/dL
eAG (mmol/L): 14.7 mmol/L

## 2023-02-08 LAB — COMPLETE METABOLIC PANEL WITH GFR
AG Ratio: 1.8 (calc) (ref 1.0–2.5)
ALT: 24 U/L (ref 9–46)
AST: 19 U/L (ref 10–35)
Albumin: 4.6 g/dL (ref 3.6–5.1)
Alkaline phosphatase (APISO): 77 U/L (ref 35–144)
BUN: 12 mg/dL (ref 7–25)
CO2: 27 mmol/L (ref 20–32)
Calcium: 9.8 mg/dL (ref 8.6–10.3)
Chloride: 98 mmol/L (ref 98–110)
Creat: 0.75 mg/dL (ref 0.70–1.30)
Globulin: 2.6 g/dL (ref 1.9–3.7)
Glucose, Bld: 244 mg/dL — ABNORMAL HIGH (ref 65–99)
Potassium: 4.1 mmol/L (ref 3.5–5.3)
Sodium: 135 mmol/L (ref 135–146)
Total Bilirubin: 0.5 mg/dL (ref 0.2–1.2)
Total Protein: 7.2 g/dL (ref 6.1–8.1)
eGFR: 106 mL/min/{1.73_m2} (ref >=60)

## 2023-02-08 LAB — VITAMIN B12: Vitamin B-12: 862 pg/mL (ref 200–1100)

## 2023-02-08 LAB — MAGNESIUM: Magnesium: 2 mg/dL (ref 1.5–2.5)

## 2023-02-08 LAB — VITAMIN D 25 HYDROXY (VIT D DEFICIENCY, FRACTURES): Vit D, 25-Hydroxy: 30 ng/mL (ref 30–100)

## 2023-02-08 LAB — INSULIN, RANDOM: Insulin: 16.2 u[IU]/mL

## 2023-02-08 LAB — IRON,TIBC AND FERRITIN PANEL
%SAT: 20 % (ref 20–48)
Ferritin: 56 ng/mL (ref 38–380)
Iron: 70 ug/dL (ref 50–180)
TIBC: 353 ug/dL (ref 250–425)

## 2023-02-08 LAB — TESTOSTERONE: Testosterone: 306 ng/dL (ref 250–827)

## 2023-02-08 LAB — TSH: TSH: 5.27 m[IU]/L — ABNORMAL HIGH (ref 0.40–4.50)

## 2023-02-08 LAB — PSA: PSA: 0.6 ng/mL (ref <=4.00)

## 2023-03-05 ENCOUNTER — Encounter: Payer: Self-pay | Admitting: *Deleted

## 2023-03-05 ENCOUNTER — Other Ambulatory Visit: Payer: Self-pay

## 2023-03-05 DIAGNOSIS — E1169 Type 2 diabetes mellitus with other specified complication: Secondary | ICD-10-CM

## 2023-03-05 MED ORDER — ROSUVASTATIN CALCIUM 40 MG PO TABS: ORAL_TABLET | ORAL | 0 refills | Status: DC

## 2023-03-28 ENCOUNTER — Encounter: Payer: Self-pay | Admitting: *Deleted

## 2023-04-11 ENCOUNTER — Other Ambulatory Visit: Payer: Self-pay

## 2023-04-11 DIAGNOSIS — E119 Type 2 diabetes mellitus without complications: Secondary | ICD-10-CM

## 2023-04-11 MED ORDER — GLIPIZIDE 5 MG PO TABS: ORAL_TABLET | ORAL | 0 refills | Status: DC

## 2023-05-09 ENCOUNTER — Ambulatory Visit: Payer: 59 | Admitting: Nurse Practitioner

## 2023-05-16 ENCOUNTER — Ambulatory Visit: Payer: 59 | Admitting: Nurse Practitioner

## 2023-06-18 ENCOUNTER — Encounter: Payer: Self-pay | Admitting: Family

## 2023-06-18 ENCOUNTER — Ambulatory Visit (INDEPENDENT_AMBULATORY_CARE_PROVIDER_SITE_OTHER): Payer: Self-pay | Admitting: Family

## 2023-06-18 ENCOUNTER — Ambulatory Visit: Payer: Self-pay | Admitting: Family

## 2023-06-18 VITALS — BP 151/72 | HR 74 | Temp 98.3°F | Resp 16 | Ht 63.0 in | Wt 147.0 lb

## 2023-06-18 DIAGNOSIS — Z603 Acculturation difficulty: Secondary | ICD-10-CM

## 2023-06-18 DIAGNOSIS — E1165 Type 2 diabetes mellitus with hyperglycemia: Secondary | ICD-10-CM

## 2023-06-18 DIAGNOSIS — E785 Hyperlipidemia, unspecified: Secondary | ICD-10-CM

## 2023-06-18 DIAGNOSIS — Z7689 Persons encountering health services in other specified circumstances: Secondary | ICD-10-CM

## 2023-06-18 DIAGNOSIS — Z7984 Long term (current) use of oral hypoglycemic drugs: Secondary | ICD-10-CM | POA: Diagnosis not present

## 2023-06-18 DIAGNOSIS — R030 Elevated blood-pressure reading, without diagnosis of hypertension: Secondary | ICD-10-CM | POA: Diagnosis not present

## 2023-06-18 LAB — POCT GLYCOSYLATED HEMOGLOBIN (HGB A1C): Hemoglobin A1C: 11.4 % — AB (ref 4.0–5.6)

## 2023-06-18 MED ORDER — ROSUVASTATIN CALCIUM 40 MG PO TABS: ORAL_TABLET | ORAL | 0 refills | Status: AC

## 2023-06-18 MED ORDER — METFORMIN HCL ER 500 MG PO TB24: ORAL_TABLET | ORAL | 0 refills | Status: DC

## 2023-06-18 MED ORDER — GLIPIZIDE 5 MG PO TABS
ORAL_TABLET | ORAL | 0 refills | Status: DC
Start: 2023-06-18 — End: 2023-09-05

## 2023-06-18 NOTE — Progress Notes (Unsigned)
 No concerns. He needs his medication for diabetes and cholesterol refilled

## 2023-06-18 NOTE — Progress Notes (Unsigned)
  Subjective:    Victor Mcintyre - 58 y.o. male MRN [id number]  Date of birth: [birth year]  HPI  Victor Mcintyre is to establish care. Patient has a PMH significant for ***.    Current issues and/or concerns:  ROS per HPI     Health Maintenance:  - *** Health Maintenance Due  Topic Date Due   COVID-19 Vaccine (1) Never done   Zoster Vaccines- Shingrix (1 of 2) Never done   Pneumococcal Vaccine 69-68 Years old (2 of 2 - PCV) 03/30/2018   DTaP/Tdap/Td (2 - Td or Tdap) 12/10/2021   OPHTHALMOLOGY EXAM  05/14/2023     Past Medical History: Patient Active Problem List   Diagnosis Date Noted   Unspecified pterygium of right eye 10/08/2019   BMI 26.0-26.9,adult 07/02/2017   History of nephrolithiasis 05/31/2016   Labile hypertension 08/24/2014   Hyperlipidemia associated with type 2 diabetes mellitus (HCC) 12/29/2012   T2_NIDDM 12/29/2012   Vitamin D  deficiency 12/29/2012      Social History   reports that he has never smoked. He has never used smokeless tobacco. He reports current alcohol use. He reports that he does not use drugs.   Family History  family history includes Cancer in his mother; Diabetes in his father.   Medications: reviewed and updated   Objective:   Physical Exam There were no vitals taken for this visit. Physical Exam      Assessment & Plan:         Patient was given clear instructions to go to Emergency Department or return to medical center if symptoms don't improve, worsen, or new problems develop.The patient verbalized understanding.  I discussed the assessment and treatment plan with the patient. The patient was provided an opportunity to ask questions and all were answered. The patient agreed with the plan and demonstrated an understanding of the instructions.   The patient was advised to call back or seek an in-person evaluation if the symptoms worsen or if the condition fails to improve as anticipated.    Lavona Pounds,  NP 06/18/2023, 1:13 PM Primary Care at [facility]

## 2023-07-17 ENCOUNTER — Encounter: Payer: Self-pay | Admitting: Family

## 2023-07-17 ENCOUNTER — Ambulatory Visit: Payer: Self-pay | Admitting: Family

## 2023-07-17 VITALS — BP 132/80 | HR 78 | Temp 99.1°F | Resp 16 | Ht 63.0 in | Wt 144.8 lb

## 2023-07-17 DIAGNOSIS — Z1321 Encounter for screening for nutritional disorder: Secondary | ICD-10-CM

## 2023-07-17 DIAGNOSIS — Z0130 Encounter for examination of blood pressure without abnormal findings: Secondary | ICD-10-CM | POA: Diagnosis not present

## 2023-07-17 DIAGNOSIS — E119 Type 2 diabetes mellitus without complications: Secondary | ICD-10-CM

## 2023-07-17 DIAGNOSIS — E1165 Type 2 diabetes mellitus with hyperglycemia: Secondary | ICD-10-CM | POA: Diagnosis not present

## 2023-07-17 DIAGNOSIS — Z7984 Long term (current) use of oral hypoglycemic drugs: Secondary | ICD-10-CM | POA: Diagnosis not present

## 2023-07-17 DIAGNOSIS — Z603 Acculturation difficulty: Secondary | ICD-10-CM

## 2023-07-17 NOTE — Progress Notes (Signed)
 Follow up on bp?

## 2023-07-17 NOTE — Progress Notes (Signed)
 Patient ID: Victor Mcintyre, male    DOB: December 05, 1965  MRN: [id number]  CC: Chronic Conditions Follow-Up  Subjective: Victor Mcintyre is a 58 y.o. male who presents for chronic conditions follow-up.   His concerns today include:  - Doing well on Metformin  XR and Glipizide , no issues/concerns. States he could use improvement in diet. He denies red flag symptoms associated with diabetes.  - Due for diabetic eye exam.  - Blood pressure check. He does not complain of red flag symptoms such as but not limited to chest pain, shortness of breath, worst headache of life, nausea/vomiting.  - States he is taking Vitamin D  and Vitamin B12 and would like labs to check levels.  Patient Active Problem List   Diagnosis Date Noted   Unspecified pterygium of right eye 10/08/2019   BMI 26.0-26.9,adult 07/02/2017   History of nephrolithiasis 05/31/2016   Labile hypertension 08/24/2014   Hyperlipidemia associated with type 2 diabetes mellitus (HCC) 12/29/2012   T2_NIDDM 12/29/2012   Vitamin D  deficiency 12/29/2012     Current Outpatient Medications on File Prior to Visit  Medication Sig Dispense Refill   Ascorbic Acid (VITAMIN C PO) Take by mouth.     aspirin EC 81 MG tablet Take 81 mg by mouth daily.     Brimonidine Tartrate (LUMIFY) 0.025 % SOLN Apply to eye 2 (two) times daily.     Cyanocobalamin (VITAMIN B-12 PO) Take by mouth.     Ferrous Sulfate (IRON PO) Take by mouth.     glipiZIDE  (GLUCOTROL ) 5 MG tablet TAKE 1 TABLET BY MOUTH TWICE DAILY WITH MEALS FOR DIABETES 180 tablet 0   metFORMIN  (GLUCOPHAGE -XR) 500 MG 24 hr tablet TAKE 2 TABLETS BY MOUTH TWICE DAILY WITH MEALS FOR DIABETES 360 tablet 0   Multiple Vitamin (MULTI VITAMIN DAILY PO) Take by mouth.     rosuvastatin  (CRESTOR ) 40 MG tablet TAKE ONE TABLET BY MOUTH 4 TIMES A WEEK FOR CHOLESTEROL 90 tablet 0   Semaglutide ,0.25 or 0.5MG /DOS, 2 MG/3ML SOPN Inject 0.25 mg dose the first week, if no nausea then increase to 0.5 mg once a week  (Patient not taking: Reported on 02/07/2023) 3 mL 2   No current facility-administered medications on file prior to visit.    Allergies  Allergen Reactions   Poison Ivy Extract [Poison Ivy Extract] Rash    Social History   Socioeconomic History   Marital status: Single    Spouse name: Not on file   Number of children: Not on file   Years of education: Not on file   Highest education level: Not on file  Occupational History   Not on file  Tobacco Use   Smoking status: Never   Smokeless tobacco: Never  Vaping Use   Vaping status: Never Used  Substance and Sexual Activity   Alcohol use: Yes    Comment: rarely drinks   Drug use: No   Sexual activity: Not Currently  Other Topics Concern   Not on file  Social History Narrative   Not on file   Social Drivers of Health   Financial Resource Strain: Low Risk  (06/18/2023)   Overall Financial Resource Strain (CARDIA)    Difficulty of Paying Living Expenses: Not hard at all  Food Insecurity: No Food Insecurity (06/18/2023)   Hunger Vital Sign    Worried About Running Out of Food in the Last Year: Never true    Ran Out of Food in the Last Year: Never true  Transportation Needs: No  Transportation Needs (06/18/2023)   PRAPARE - Administrator, Civil Service (Medical): No    Lack of Transportation (Non-Medical): No  Physical Activity: Insufficiently Active (06/18/2023)   Exercise Vital Sign    Days of Exercise per Week: 3 days    Minutes of Exercise per Session: 30 min  Stress: No Stress Concern Present (06/18/2023)   [employer] of Occupational Health - Occupational Stress Questionnaire    Feeling of Stress : Not at all  Social Connections: Moderately Isolated (06/18/2023)   Social Connection and Isolation Panel    Frequency of Communication with Friends and Family: More than three times a week    Frequency of Social Gatherings with Friends and Family: Twice a week    Attends Religious Services: More than 4 times  per year    Active Member of [employer] or Organizations: No    Attends Banker Meetings: Never    Marital Status: Never married  Intimate Partner Violence: Not At Risk (06/18/2023)   Humiliation, Afraid, Rape, and Kick questionnaire    Fear of Current or Ex-Partner: No    Emotionally Abused: No    Physically Abused: No    Sexually Abused: No    Family History  Problem Relation Age of Onset   Cancer Mother    Diabetes Father     Past Surgical History:  Procedure Laterality Date   CATARACT EXTRACTION Left 09/2016   LITHOTRIPSY  2002    ROS: Review of Systems Negative except as stated above  PHYSICAL EXAM: BP 132/80   Pulse 78   Temp 99.1 F (37.3 C) (Oral)   Resp 16   Ht 5' 3 (1.6 m)   Wt 144 lb 12.8 oz (65.7 kg)   SpO2 95%   BMI 25.65 kg/m   Physical Exam HENT:     Head: Normocephalic and atraumatic.     Nose: Nose normal.     Mouth/Throat:     Mouth: Mucous membranes are moist.     Pharynx: Oropharynx is clear.   Eyes:     Extraocular Movements: Extraocular movements intact.     Conjunctiva/sclera: Conjunctivae normal.     Pupils: Pupils are equal, round, and reactive to light.    Cardiovascular:     Rate and Rhythm: Normal rate and regular rhythm.     Pulses: Normal pulses.     Heart sounds: Normal heart sounds.  Pulmonary:     Effort: Pulmonary effort is normal.     Breath sounds: Normal breath sounds.   Musculoskeletal:        General: Normal range of motion.     Cervical back: Normal range of motion and neck supple.   Neurological:     General: No focal deficit present.     Mental Status: He is alert and oriented to person, place, and time.   Psychiatric:        Mood and Affect: Mood normal.        Behavior: Behavior normal.     ASSESSMENT AND PLAN: 1. Type 2 diabetes mellitus with hyperglycemia, without long-term current use of insulin  (HCC) (Primary) - Continue Metformin  XR and Glipizide  as prescribed. No refills needed as  of present.  - Hemoglobin A1c result pending.  - Discussed the importance of healthy eating habits, low-carbohydrate diet, low-sugar diet, regular aerobic exercise (at least 150 minutes a week as tolerated) and medication compliance to achieve or maintain control of diabetes. Counseled on medication adherence/adverse effects.  - Follow-up  with primary provider as scheduled. - Hemoglobin A1c  2. Diabetic eye exam [facility]) - Referral to Ophthalmology for evaluation/management. - Ambulatory referral to Ophthalmology  3. Blood pressure check - Blood pressure normal today in office. - Follow-up with primary provider as scheduled.  4. Encounter for vitamin deficiency screening - Routine screening.  - Vitamin B12 - Vitamin D , 25-hydroxy  5. Language barrier - Patient speaking English.   Patient was given the opportunity to ask questions.  Patient verbalized understanding of the plan and was able to repeat key elements of the plan. Patient was given clear instructions to go to Emergency Department or return to medical center if symptoms don't improve, worsen, or new problems develop.The patient verbalized understanding.   Orders Placed This Encounter  Procedures   Vitamin B12   Vitamin D , 25-hydroxy   Hemoglobin A1c   Ambulatory referral to Ophthalmology    Follow-up with primary provider as scheduled.  Greig JINNY Drones, NP

## 2023-07-18 ENCOUNTER — Ambulatory Visit: Payer: Self-pay | Admitting: Family

## 2023-07-18 DIAGNOSIS — Z794 Long term (current) use of insulin: Secondary | ICD-10-CM

## 2023-07-18 LAB — VITAMIN D 25 HYDROXY (VIT D DEFICIENCY, FRACTURES): Vit D, 25-Hydroxy: 34 ng/mL (ref 30.0–100.0)

## 2023-07-18 LAB — VITAMIN B12: Vitamin B-12: 1013 pg/mL (ref 232–1245)

## 2023-07-18 LAB — HEMOGLOBIN A1C
Est. average glucose Bld gHb Est-mCnc: 289 mg/dL
Hgb A1c MFr Bld: 11.7 % — ABNORMAL HIGH (ref 4.8–5.6)

## 2023-07-18 MED ORDER — PEN NEEDLES 31G X 8 MM MISC: 1.0000 | Freq: Every day | 0 refills | Status: AC

## 2023-07-18 MED ORDER — INSULIN GLARGINE 100 UNIT/ML SOLOSTAR PEN: 10.0000 [IU] | PEN_INJECTOR | Freq: Every day | SUBCUTANEOUS | 0 refills | Status: AC

## 2023-08-16 ENCOUNTER — Ambulatory Visit: Payer: 59 | Admitting: Internal Medicine

## 2023-08-19 ENCOUNTER — Ambulatory Visit: Payer: 59 | Admitting: Internal Medicine

## 2023-09-05 ENCOUNTER — Other Ambulatory Visit: Payer: Self-pay | Admitting: Family

## 2023-09-05 DIAGNOSIS — E1165 Type 2 diabetes mellitus with hyperglycemia: Secondary | ICD-10-CM

## 2023-11-19 ENCOUNTER — Ambulatory Visit: Payer: 59 | Admitting: Nurse Practitioner

## 2023-11-20 ENCOUNTER — Ambulatory Visit: Payer: 59 | Admitting: Nurse Practitioner

## 2023-12-01 ENCOUNTER — Other Ambulatory Visit: Payer: Self-pay | Admitting: Family

## 2023-12-01 DIAGNOSIS — E1165 Type 2 diabetes mellitus with hyperglycemia: Secondary | ICD-10-CM

## 2024-01-07 ENCOUNTER — Other Ambulatory Visit: Payer: Self-pay | Admitting: Family

## 2024-01-07 DIAGNOSIS — E1165 Type 2 diabetes mellitus with hyperglycemia: Secondary | ICD-10-CM

## 2024-02-17 ENCOUNTER — Encounter: Payer: 59 | Admitting: Internal Medicine

## 2024-02-24 ENCOUNTER — Encounter: Payer: 59 | Admitting: Internal Medicine
